# Patient Record
Sex: Male | Born: 1975 | Race: White | Hispanic: No | Marital: Married | Smoking: Current some day smoker
Health system: Southern US, Community
[De-identification: ages and names within clinical notes are randomized; demographics above are authoritative.]

## PROBLEM LIST (undated history)

## (undated) DIAGNOSIS — L089 Local infection of the skin and subcutaneous tissue, unspecified: Secondary | ICD-10-CM

## (undated) DIAGNOSIS — F1910 Other psychoactive substance abuse, uncomplicated: Secondary | ICD-10-CM

## (undated) DIAGNOSIS — B9562 Methicillin resistant Staphylococcus aureus infection as the cause of diseases classified elsewhere: Secondary | ICD-10-CM

## (undated) DIAGNOSIS — J342 Deviated nasal septum: Secondary | ICD-10-CM

## (undated) DIAGNOSIS — F17200 Nicotine dependence, unspecified, uncomplicated: Secondary | ICD-10-CM

## (undated) DIAGNOSIS — K219 Gastro-esophageal reflux disease without esophagitis: Secondary | ICD-10-CM

## (undated) HISTORY — PX: ABDOMINAL SURGERY: SHX537

## (undated) HISTORY — PX: HAND SURGERY: SHX662

---

## 2013-06-18 ENCOUNTER — Emergency Department (HOSPITAL_COMMUNITY): Payer: No Typology Code available for payment source

## 2013-06-18 ENCOUNTER — Encounter (HOSPITAL_COMMUNITY): Payer: Self-pay | Admitting: Emergency Medicine

## 2013-06-18 ENCOUNTER — Emergency Department (HOSPITAL_COMMUNITY)
Admission: EM | Admit: 2013-06-18 | Discharge: 2013-06-18 | Disposition: A | Discharge status: Home / Self Care | Payer: No Typology Code available for payment source | Attending: Emergency Medicine | Admitting: Emergency Medicine

## 2013-06-18 DIAGNOSIS — S20219A Contusion of unspecified front wall of thorax, initial encounter: Secondary | ICD-10-CM | POA: Insufficient documentation

## 2013-06-18 DIAGNOSIS — S20211A Contusion of right front wall of thorax, initial encounter: Secondary | ICD-10-CM

## 2013-06-18 DIAGNOSIS — Z880 Allergy status to penicillin: Secondary | ICD-10-CM | POA: Insufficient documentation

## 2013-06-18 DIAGNOSIS — F17200 Nicotine dependence, unspecified, uncomplicated: Secondary | ICD-10-CM | POA: Insufficient documentation

## 2013-06-18 DIAGNOSIS — Z79899 Other long term (current) drug therapy: Secondary | ICD-10-CM | POA: Insufficient documentation

## 2013-06-18 DIAGNOSIS — H00019 Hordeolum externum unspecified eye, unspecified eyelid: Secondary | ICD-10-CM | POA: Insufficient documentation

## 2013-06-18 DIAGNOSIS — Y92410 Unspecified street and highway as the place of occurrence of the external cause: Secondary | ICD-10-CM | POA: Insufficient documentation

## 2013-06-18 DIAGNOSIS — S46912A Strain of unspecified muscle, fascia and tendon at shoulder and upper arm level, left arm, initial encounter: Secondary | ICD-10-CM

## 2013-06-18 DIAGNOSIS — IMO0002 Reserved for concepts with insufficient information to code with codable children: Secondary | ICD-10-CM | POA: Insufficient documentation

## 2013-06-18 DIAGNOSIS — Y9389 Activity, other specified: Secondary | ICD-10-CM | POA: Insufficient documentation

## 2013-06-18 LAB — URINALYSIS, ROUTINE W REFLEX MICROSCOPIC
BILIRUBIN URINE: NEGATIVE
Glucose, UA: NEGATIVE mg/dL
HGB URINE DIPSTICK: NEGATIVE
Ketones, ur: NEGATIVE mg/dL
Leukocytes, UA: NEGATIVE
Nitrite: NEGATIVE
PH: 6 (ref 5.0–8.0)
Protein, ur: NEGATIVE mg/dL
Specific Gravity, Urine: 1.02 (ref 1.005–1.030)
Urobilinogen, UA: 0.2 mg/dL (ref 0.0–1.0)

## 2013-06-18 MED ORDER — HYDROCODONE-ACETAMINOPHEN 5-325 MG PO TABS: 1.0000 | ORAL_TABLET | ORAL | Status: DC | PRN

## 2013-06-18 MED ORDER — DICLOFENAC SODIUM 75 MG PO TBEC: 75.0000 mg | DELAYED_RELEASE_TABLET | Freq: Two times a day (BID) | ORAL | Status: DC
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
## 2013-06-19 NOTE — ED Provider Notes (Signed)
Medical screening examination/treatment/procedure(s) were performed by non-physician practitioner and as supervising physician I was immediately available for consultation/collaboration.  EKG Interpretation   None         Athena Baltz L Makana Rostad, MD 06/19/13 1002 

## 2013-10-02 ENCOUNTER — Emergency Department (HOSPITAL_COMMUNITY): Payer: No Typology Code available for payment source

## 2013-10-02 ENCOUNTER — Encounter (HOSPITAL_COMMUNITY): Payer: Self-pay | Admitting: Emergency Medicine

## 2013-10-02 ENCOUNTER — Emergency Department (HOSPITAL_COMMUNITY)
Admission: EM | Admit: 2013-10-02 | Discharge: 2013-10-02 | Disposition: A | Discharge status: Home / Self Care | Payer: No Typology Code available for payment source | Attending: Emergency Medicine | Admitting: Emergency Medicine

## 2013-10-02 DIAGNOSIS — R42 Dizziness and giddiness: Secondary | ICD-10-CM | POA: Insufficient documentation

## 2013-10-02 DIAGNOSIS — Z880 Allergy status to penicillin: Secondary | ICD-10-CM | POA: Insufficient documentation

## 2013-10-02 DIAGNOSIS — R079 Chest pain, unspecified: Secondary | ICD-10-CM

## 2013-10-02 DIAGNOSIS — F17200 Nicotine dependence, unspecified, uncomplicated: Secondary | ICD-10-CM | POA: Insufficient documentation

## 2013-10-02 DIAGNOSIS — R0789 Other chest pain: Secondary | ICD-10-CM | POA: Insufficient documentation

## 2013-10-02 LAB — BASIC METABOLIC PANEL
BUN: 9 mg/dL (ref 6–23)
CHLORIDE: 101 meq/L (ref 96–112)
CO2: 25 mEq/L (ref 19–32)
CREATININE: 0.84 mg/dL (ref 0.50–1.35)
Calcium: 8.7 mg/dL (ref 8.4–10.5)
GFR calc Af Amer: >90 mL/min (ref >90)
GFR calc non Af Amer: >90 mL/min (ref >90)
GLUCOSE: 114 mg/dL — AB (ref 70–99)
Potassium: 3.7 mEq/L (ref 3.7–5.3)
Sodium: 138 mEq/L (ref 137–147)

## 2013-10-02 LAB — CBC
HCT: 43.1 % (ref 39.0–52.0)
HEMOGLOBIN: 15.3 g/dL (ref 13.0–17.0)
MCH: 35.7 pg — AB (ref 26.0–34.0)
MCHC: 35.5 g/dL (ref 30.0–36.0)
MCV: 100.7 fL — AB (ref 78.0–100.0)
Platelets: 221 10*3/uL (ref 150–400)
RBC: 4.28 MIL/uL (ref 4.22–5.81)
RDW: 13.1 % (ref 11.5–15.5)
WBC: 8.1 10*3/uL (ref 4.0–10.5)

## 2013-10-02 LAB — TROPONIN I: Troponin I: <0.3 ng/mL (ref <0.30)

## 2013-10-02 NOTE — ED Notes (Signed)
Pt states he noticed pressure to entire upper chest with "tightness" to left chest. Pt states he noticed the pain at 0700. Pt also states he had chills and dizziness.

## 2013-10-02 NOTE — Discharge Instructions (Signed)
Chest Pain (Nonspecific) Chest pain has many causes. Your pain could be caused by something serious, such as a heart attack or a blood clot in the lungs. It could also be caused by something less serious, such as a chest bruise or a virus. Follow up with your doctor. More lab tests or other studies may be needed to find the cause of your pain. Most of the time, nonspecific chest pain will improve within 2 to 3 days of rest and mild pain medicine. HOME CARE  For chest bruises, you may put ice on the sore area for 15-20 minutes, 03-04 times a day. Do this only if it makes you feel better.  Put ice in a plastic bag.  Place a towel between the skin and the bag.  Rest for the next 2 to 3 days.  Go back to work if the pain improves.  See your doctor if the pain lasts longer than 1 to 2 weeks.  Only take medicine as told by your doctor.  Quit smoking if you smoke. GET HELP RIGHT AWAY IF:   There is more pain or pain that spreads to the arm, neck, jaw, back, or belly (abdomen).  You have shortness of breath.  You cough more than usual or cough up blood.  You have very bad back or belly pain, feel sick to your stomach (nauseous), or throw up (vomit).  You have very bad weakness.  You pass out (faint).  You have a fever. Any of these problems may be serious and may be an emergency. Do not wait to see if the problems will go away. Get medical help right away. Call your local emergency services 911 in [place] Do not drive yourself to the hospital. MAKE SURE YOU:   Understand these instructions.  Will watch this condition.  Will get help right away if you or your child is not doing well or gets worse. Document Released: 11/13/2007 Document Revised: 08/19/2011 Document Reviewed: 11/13/2007 [facility] Patient Information 2014 [place]ExitCare, [place].  Tests were normal. Tylenol or ibuprofen for pain. Try to get a primary care Dr.

## 2013-10-02 NOTE — ED Provider Notes (Signed)
CSN: [id number]     Arrival date & time 10/02/13  0902 History  This chart was scribed for Donnetta HutchingBrian Bhavin Monjaraz, MD by Quintella ReichertMatthew Underwood, ED scribe.  This patient was seen in room APA06/APA06 and the patient's care was started at 10:25 AM.   Chief Complaint  Patient presents with  . Chest Pain    The history is provided by the patient. No language interpreter was used.    HPI Comments: Eric Woods is a 38 y.o. male who presents to the Emergency Department complaining of upper chest "tightness" that began 3 1/2 hours ago.  Pt reports he was standing in the restroom when he suddenly developed pressure to his entire upper chest with "tightness" to his left chest.  He also reports associated lightheadedness, dizziness, and hot and cold flashes.  These symptoms have since resolved but his chest tightness has persisted.  Pt notes that he had a similar episode 1 week ago in which he was standing in the restroom after urinating and suddenly became lightheaded and dizzy and saw "lights."  He notes that he was alcohol-intoxicated at that time.  Pt denies h/o DM, HTN, or any other chronic medical conditions.  He takes Roxicodone 20mg  occasionally "just to feel relaxed" but denies any regular prescription medication usage.  He uses marijuana (4-6 cigars/day).  He also smokes cigarettes daily.  He states he drinks alcohol socially but is not a heavy drinker.  Pt recently got out of jail and is not employed currently.  Pt admits to family h/o heart disease in his paternal grandfather who died of a heart attack, but he is unsure how old he was at the time of death.   History reviewed. No pertinent past medical history.   Past Surgical History  Procedure Laterality Date  . Abdominal surgery      No family history on file.   History  Substance Use Topics  . Smoking status: Current Every Day Smoker    Types: Cigarettes  . Smokeless tobacco: Not on file  . Alcohol Use: Yes     Review of Systems A complete  10 system review of systems was obtained and all systems are negative except as noted in the HPI and PMH.     Allergies  Penicillins  Home Medications   Prior to Admission medications   Not on File   BP 108/64  Pulse 80  Temp(Src) 98 F (36.7 C) (Oral)  Resp 16  SpO2 95%  Physical Exam  Nursing note and vitals reviewed. Constitutional: He is oriented to person, place, and time. He appears well-developed and well-nourished.  HENT:  Head: Normocephalic and atraumatic.  Eyes: Conjunctivae and EOM are normal. Pupils are equal, round, and reactive to light.  Neck: Normal range of motion. Neck supple.  Cardiovascular: Normal rate, regular rhythm and normal heart sounds.   No murmur heard. Pulmonary/Chest: Effort normal and breath sounds normal. No respiratory distress. He has no wheezes. He has no rales.  Abdominal: Soft. Bowel sounds are normal.  Musculoskeletal: Normal range of motion.  Neurological: He is alert and oriented to person, place, and time.  Skin: Skin is warm and dry.  Psychiatric: He has a normal mood and affect. His behavior is normal.    ED Course  Procedures (including critical care time)  DIAGNOSTIC STUDIES: Oxygen Saturation is 95% which is normal by my interpretation.    COORDINATION OF CARE: 10:30 AM-Advised pt to manage his risk factors including his excessive smoking.  Discussed treatment plan  which includes EKG, CXR and labs with pt at bedside and pt agreed to plan.    Results for orders placed during the hospital encounter of 10/02/13  BASIC METABOLIC PANEL      Result Value Ref Range   Sodium 138  137 - 147 mEq/L   Potassium 3.7  3.7 - 5.3 mEq/L   Chloride 101  96 - 112 mEq/L   CO2 25  19 - 32 mEq/L   Glucose, Bld 114 (*) 70 - 99 mg/dL   BUN 9  6 - 23 mg/dL   Creatinine, Ser 9.600.84  0.50 - 1.35 mg/dL   Calcium 8.7  8.4 - 45.410.5 mg/dL   GFR calc non Af Amer >90  >90 mL/min   GFR calc Af Amer >90  >90 mL/min  CBC      Result Value Ref Range    WBC 8.1  4.0 - 10.5 K/uL   RBC 4.28  4.22 - 5.81 MIL/uL   Hemoglobin 15.3  13.0 - 17.0 g/dL   HCT 09.843.1  11.939.0 - 14.752.0 %   MCV 100.7 (*) 78.0 - 100.0 fL   MCH 35.7 (*) 26.0 - 34.0 pg   MCHC 35.5  30.0 - 36.0 g/dL   RDW 82.913.1  56.211.5 - 13.015.5 %   Platelets 221  150 - 400 K/uL  TROPONIN I      Result Value Ref Range   Troponin I <0.30  <0.30 ng/mL    Dg Chest 2 View  10/02/2013   CLINICAL DATA:  Chest tightness  EXAM: CHEST  2 VIEW  COMPARISON:  06/18/2013  FINDINGS: Lungs are clear.  No pleural effusion or pneumothorax.  The heart is normal in size.  Mild degenerative changes of the visualized thoracolumbar spine.  IMPRESSION: No evidence of acute cardiopulmonary disease.   Electronically Signed   By: Charline BillsSriyesh  Krishnan M.D.   On: 10/02/2013 10:21     EKG Interpretation   Date/Time:  Saturday October 02 2013 09:13:36 EDT Ventricular Rate:  53 PR Interval:  158 QRS Duration: 108 QT Interval:  426 QTC Calculation: 399 R Axis:   43 Text Interpretation:  Sinus bradycardia Otherwise normal ECG No previous  ECGs available Confirmed by Donnalynn Wheeless  MD, Meeya Goldin ([id number]) on 10/02/2013 1:14:57  PM      MDM   Final diagnoses:  Chest pain    Patient is low risk for ACS or PE. Screening test negative. He is in no acute distress    I personally performed the services described in this documentation, which was scribed in my presence. The recorded information has been reviewed and is accurate.    Donnetta HutchingBrian Lacresia Darwish, MD 10/02/13 1316

## 2013-12-31 ENCOUNTER — Encounter (HOSPITAL_COMMUNITY): Payer: Self-pay | Admitting: Emergency Medicine

## 2013-12-31 ENCOUNTER — Emergency Department (HOSPITAL_COMMUNITY)
Admission: EM | Admit: 2013-12-31 | Discharge: 2013-12-31 | Disposition: A | Discharge status: Home / Self Care | Payer: No Typology Code available for payment source | Attending: Emergency Medicine | Admitting: Emergency Medicine

## 2013-12-31 DIAGNOSIS — R21 Rash and other nonspecific skin eruption: Secondary | ICD-10-CM | POA: Insufficient documentation

## 2013-12-31 DIAGNOSIS — Z880 Allergy status to penicillin: Secondary | ICD-10-CM | POA: Insufficient documentation

## 2013-12-31 DIAGNOSIS — L738 Other specified follicular disorders: Secondary | ICD-10-CM | POA: Insufficient documentation

## 2013-12-31 DIAGNOSIS — L739 Follicular disorder, unspecified: Secondary | ICD-10-CM

## 2013-12-31 DIAGNOSIS — R110 Nausea: Secondary | ICD-10-CM | POA: Insufficient documentation

## 2013-12-31 DIAGNOSIS — F17200 Nicotine dependence, unspecified, uncomplicated: Secondary | ICD-10-CM | POA: Insufficient documentation

## 2013-12-31 MED ORDER — SULFAMETHOXAZOLE-TRIMETHOPRIM 800-160 MG PO TABS: 1.0000 | ORAL_TABLET | Freq: Two times a day (BID) | ORAL | Status: DC

## 2013-12-31 NOTE — Discharge Instructions (Signed)

## 2013-12-31 NOTE — ED Notes (Signed)
Pt alert & oriented x4, stable gait. Patient given discharge instructions, paperwork & prescription(s). Patient  instructed to stop at the registration desk to finish any additional paperwork. Patient verbalized understanding. Pt left department w/ no further questions. 

## 2013-12-31 NOTE — ED Notes (Signed)
Pt has a rash on the inside of his legs. Pt states the rash hurts and makes him nauseated.

## 2014-01-02 NOTE — ED Provider Notes (Signed)
CSN: [id number]     Arrival date & time 12/31/13  1849 History   First MD Initiated Contact with Patient 12/31/13 1905     No chief complaint on file.    (Consider location/radiation/quality/duration/timing/severity/associated sxs/prior Treatment)  Eric Woods is a 38 y.o. male who presents to the Emergency Department complaining of painful rash to both legs.  He states it begins as "pimples" and he squeezes them, but its not improving.   Patient is a 38 y.o. male presenting with rash. The history is provided by the patient.  Rash Location:  Leg Leg rash location:  R upper leg, L upper leg and R lower leg Quality: painful and redness   Quality: not blistering, not itchy, not swelling and not weeping   Pain details:    Quality:  Aching   Severity:  Mild   Onset quality:  Gradual   Duration:  3 days   Timing:  Constant   Progression:  Unchanged Severity:  Moderate Onset quality:  Gradual Timing:  Constant Progression:  Unchanged Chronicity:  New Context: not hot tub use, not medications and not plant contact   Context comment:  Patient was recently incarcerated Relieved by:  Nothing Worsened by:  Nothing tried Ineffective treatments:  None tried Associated symptoms: nausea   Associated symptoms: no abdominal pain, no diarrhea, no fever, no headaches, no induration, no joint pain, no myalgias, no periorbital edema, no shortness of breath, no sore throat, no throat swelling, no tongue swelling, no URI, not vomiting and not wheezing     History reviewed. No pertinent past medical history. Past Surgical History  Procedure Laterality Date  . Abdominal surgery     History reviewed. No pertinent family history. History  Substance Use Topics  . Smoking status: Current Every Day Smoker    Types: Cigarettes  . Smokeless tobacco: Not on file  . Alcohol Use: Yes    Review of Systems  Constitutional: Negative for fever, chills, activity change and appetite change.  HENT:  Negative for facial swelling, sore throat and trouble swallowing.   Respiratory: Negative for chest tightness, shortness of breath and wheezing.   Gastrointestinal: Positive for nausea. Negative for vomiting, abdominal pain and diarrhea.  Musculoskeletal: Negative for arthralgias, myalgias, neck pain and neck stiffness.  Skin: Positive for color change and rash. Negative for wound.  Neurological: Negative for dizziness, weakness, numbness and headaches.  Hematological: Negative for adenopathy.  All other systems reviewed and are negative.     Allergies  Penicillins  Home Medications   Prior to Admission medications   Medication Sig Start Date End Date Taking? Authorizing Provider  sulfamethoxazole-trimethoprim (SEPTRA DS) 800-160 MG per tablet Take 1 tablet by mouth 2 (two) times daily. For 10 days 12/31/13   Akiel Fennell L. Rafay Dahan, PA-C   BP 124/85  Pulse 73  Temp(Src) 98.4 F (36.9 C) (Oral)  Resp 18  Ht 6' (1.829 m)  Wt 183 lb 4.8 oz (83.144 kg)  BMI 24.85 kg/m2  SpO2 98% Physical Exam  Nursing note and vitals reviewed. Constitutional: He is oriented to person, place, and time. He appears well-developed and well-nourished. No distress.  HENT:  Head: Normocephalic and atraumatic.  Mouth/Throat: Oropharynx is clear and moist.  Neck: Normal range of motion. Neck supple.  Cardiovascular: Normal rate, regular rhythm, normal heart sounds and intact distal pulses.   No murmur heard. Pulmonary/Chest: Effort normal and breath sounds normal. No respiratory distress.  Musculoskeletal: He exhibits no edema and no tenderness.  Lymphadenopathy:  He has no cervical adenopathy.  Neurological: He is alert and oriented to person, place, and time. He exhibits normal muscle tone. Coordination normal.  Skin: Skin is warm. Rash noted. There is erythema.  Multiple, < 1 cm erythematous pustules to the bilateral LE's.  Some of the lesions are scabbed over.  No induration or surrounding erythema.     ED Course  Procedures (including critical care time) Labs Review Labs Reviewed - No data to display  Imaging Review No results found.   EKG Interpretation None      MDM   Final diagnoses:  Folliculitis   Pt is well appearing, vitals stable.  Rash likely related to folliculitis.  Pt was recently incarcerated, so MRSA also possible.  Will treat with bactrim and he agrees to return here in 2-3 days if not improving.       Anushree Dorsi L. Zacariah Belue, PA-C 01/02/14 1310

## 2014-01-03 NOTE — ED Provider Notes (Signed)
Medical screening examination/treatment/procedure(s) were performed by non-physician practitioner and as supervising physician I was immediately available for consultation/collaboration.   EKG Interpretation None       Donnetta HutchingBrian Redmond Whittley, MD 01/03/14 [phone]

## 2014-05-21 ENCOUNTER — Emergency Department (HOSPITAL_COMMUNITY): Payer: Self-pay

## 2014-05-21 ENCOUNTER — Emergency Department (HOSPITAL_COMMUNITY)
Admission: EM | Admit: 2014-05-21 | Discharge: 2014-05-21 | Disposition: A | Discharge status: Home / Self Care | Payer: Self-pay | Attending: Emergency Medicine | Admitting: Emergency Medicine

## 2014-05-21 ENCOUNTER — Encounter (HOSPITAL_COMMUNITY): Payer: Self-pay | Admitting: Emergency Medicine

## 2014-05-21 DIAGNOSIS — B070 Plantar wart: Secondary | ICD-10-CM | POA: Insufficient documentation

## 2014-05-21 DIAGNOSIS — Z792 Long term (current) use of antibiotics: Secondary | ICD-10-CM | POA: Insufficient documentation

## 2014-05-21 DIAGNOSIS — M79672 Pain in left foot: Secondary | ICD-10-CM

## 2014-05-21 DIAGNOSIS — Z880 Allergy status to penicillin: Secondary | ICD-10-CM | POA: Insufficient documentation

## 2014-05-21 DIAGNOSIS — Z720 Tobacco use: Secondary | ICD-10-CM | POA: Insufficient documentation

## 2014-05-21 MED ORDER — HYDROCODONE-ACETAMINOPHEN 5-325 MG PO TABS: ORAL_TABLET | ORAL | Status: DC

## 2014-05-21 MED ORDER — NAPROXEN 500 MG PO TABS: 500.0000 mg | ORAL_TABLET | Freq: Two times a day (BID) | ORAL | Status: DC

## 2014-05-21 NOTE — Discharge Instructions (Signed)
Plantar Warts  Plantar warts are growths on the bottom of your foot. Warts are caused by a germ.   HOME CARE  · Soak your foot in warm water. Dry your foot when you are done. Remove the top layer of softened skin, then apply any medicine as told by your doctor.  · Remove any bandages daily. File off extra wart tissue. Repeat this as told by your doctor until the wart goes away.  · Only use medicine as told by your doctor.  · Use a bandage with a hole in it (doughnut bandage) to relieve pain. Put the hole over the wart.  · Wear shoes and socks and change them daily.  · Keep your foot clean and dry.  · Check your feet regularly.  · Avoid contact with warts on other people.  · Have your warts checked by your doctor.  GET HELP RIGHT AWAY IF:  The treated skin becomes red, puffy (swollen), or painful.  MAKE SURE YOU:  · Understand these instructions.  · Will watch your condition.  · Will get help right away if you are not doing well or get worse.  Document Released: 06/29/2010 Document Revised: 10/11/2013 Document Reviewed: 06/29/2010  ExitCare® Patient Information ©2015 [employer]. This information is not intended to replace advice given to you by your health care provider. Make sure you discuss any questions you have with your health care provider.

## 2014-05-21 NOTE — ED Notes (Signed)
Pt states he has pain to bottom of L. Foot. Denies any injury. States he has picked at the spot on the ball of his foot.

## 2014-05-23 NOTE — ED Provider Notes (Signed)
CSN: [id number]     Arrival date & time 05/21/14  1532 History   First MD Initiated Contact with Patient 05/21/14 1625     Chief Complaint  Patient presents with  . Foot Pain     (Consider location/radiation/quality/duration/timing/severity/associated sxs/prior Treatment) HPI  Eric Woods is a 38 y.o. male who presents to the Emergency Department complaining of left foot pain for several days to weeks.  He reports incresing pain with weight bearing and improvement when at rest.  He describes a sharp, sticking sensation ot his foot with pressure.  He denies known injury, drainage, swelling or redness.  He has tried to "pick" the area , but states it made the pain worse.    History reviewed. No pertinent past medical history. Past Surgical History  Procedure Laterality Date  . Abdominal surgery     History reviewed. No pertinent family history. History  Substance Use Topics  . Smoking status: Current Every Day Smoker    Types: Cigarettes  . Smokeless tobacco: Not on file  . Alcohol Use: Yes    Review of Systems  Constitutional: Negative for fever and chills.  Genitourinary: Negative for dysuria and difficulty urinating.  Musculoskeletal: Positive for arthralgias. Negative for joint swelling.  Skin: Negative for color change and wound.  All other systems reviewed and are negative.     Allergies  Penicillins  Home Medications   Prior to Admission medications   Medication Sig Start Date End Date Taking? Authorizing Provider  HYDROcodone-acetaminophen (NORCO/VICODIN) 5-325 MG per tablet Take one-two tabs po q 4-6 hrs prn pain 05/21/14   Earley Grobe L. Shaneisha Burkel, PA-C  naproxen (NAPROSYN) 500 MG tablet Take 1 tablet (500 mg total) by mouth 2 (two) times daily with a meal. 05/21/14   Saleem Coccia L. Brucha Ahlquist, PA-C  sulfamethoxazole-trimethoprim (SEPTRA DS) 800-160 MG per tablet Take 1 tablet by mouth 2 (two) times daily. For 10 days 12/31/13   Drusilla Wampole L. Calyse Murcia, PA-C   BP 111/71 mmHg   Pulse 90  Temp(Src) 98.3 F (36.8 C) (Oral)  Resp 18  Ht 6' (1.829 m)  Wt 190 lb (86.183 kg)  BMI 25.76 kg/m2  SpO2 97% Physical Exam  Constitutional: He is oriented to person, place, and time. He appears well-developed and well-nourished. No distress.  HENT:  Head: Normocephalic and atraumatic.  Cardiovascular: Normal rate, regular rhythm, normal heart sounds and intact distal pulses.   Pulmonary/Chest: Effort normal and breath sounds normal.  Musculoskeletal: He exhibits tenderness.  Localized plantar wart to the ball of the left foot.  ROM is preserved.  DP pulse is brisk,distal sensation intact.  No erythema, abrasion, bruising or bony deformity.  No proximal tenderness. No FB  Neurological: He is alert and oriented to person, place, and time. He exhibits normal muscle tone. Coordination normal.  Skin: Skin is warm and dry.  Nursing note and vitals reviewed.   ED Course  Procedures (including critical care time) Labs Review Labs Reviewed - No data to display  Imaging Review Dg Foot Complete Left  05/21/2014   CLINICAL DATA:  38 year old male with progressive left foot pain  EXAM: LEFT FOOT - COMPLETE 3+ VIEW  COMPARISON:  None  FINDINGS: No evidence for acute fracture or malalignment. No arthropathy. Normal bony mineralization. No lytic or blastic osseous lesion.  IMPRESSION: Negative.   Electronically Signed   By: Malachy MoanHeath  McCullough M.D.   On: 05/21/2014 16:24     EKG Interpretation None      MDM   Final diagnoses:  Plantar wart of left foot    Pt with plantar wart to the left foot.  No concerning signs of infection or FB.  Given referral for podiatry.  Pt agrees to arrange f/u    Tata Timmins L. Keimya Briddell, PA-C 05/23/14 2258  Kasin Tonkinson L. Trisha Mangleriplett, PA-C 05/23/14 2259  Glynn OctaveStephen Rancour, MD 05/24/14 [phone]

## 2014-06-11 DIAGNOSIS — W1839XA Other fall on same level, initial encounter: Secondary | ICD-10-CM | POA: Insufficient documentation

## 2014-06-11 DIAGNOSIS — Z23 Encounter for immunization: Secondary | ICD-10-CM | POA: Insufficient documentation

## 2014-06-11 DIAGNOSIS — R079 Chest pain, unspecified: Secondary | ICD-10-CM | POA: Insufficient documentation

## 2014-06-11 DIAGNOSIS — Z720 Tobacco use: Secondary | ICD-10-CM | POA: Insufficient documentation

## 2014-06-11 DIAGNOSIS — Y9289 Other specified places as the place of occurrence of the external cause: Secondary | ICD-10-CM | POA: Insufficient documentation

## 2014-06-11 DIAGNOSIS — Y998 Other external cause status: Secondary | ICD-10-CM | POA: Insufficient documentation

## 2014-06-11 DIAGNOSIS — Z791 Long term (current) use of non-steroidal anti-inflammatories (NSAID): Secondary | ICD-10-CM | POA: Insufficient documentation

## 2014-06-11 DIAGNOSIS — F10120 Alcohol abuse with intoxication, uncomplicated: Secondary | ICD-10-CM | POA: Insufficient documentation

## 2014-06-11 DIAGNOSIS — S0181XA Laceration without foreign body of other part of head, initial encounter: Secondary | ICD-10-CM | POA: Insufficient documentation

## 2014-06-11 DIAGNOSIS — Y9389 Activity, other specified: Secondary | ICD-10-CM | POA: Insufficient documentation

## 2014-06-11 DIAGNOSIS — Z792 Long term (current) use of antibiotics: Secondary | ICD-10-CM | POA: Insufficient documentation

## 2014-06-11 DIAGNOSIS — Z880 Allergy status to penicillin: Secondary | ICD-10-CM | POA: Insufficient documentation

## 2014-06-12 ENCOUNTER — Emergency Department (HOSPITAL_COMMUNITY)
Admission: EM | Admit: 2014-06-12 | Discharge: 2014-06-12 | Disposition: A | Discharge status: Home / Self Care | Payer: Self-pay | Attending: Emergency Medicine | Admitting: Emergency Medicine

## 2014-06-12 ENCOUNTER — Encounter (HOSPITAL_COMMUNITY): Payer: Self-pay | Admitting: Emergency Medicine

## 2014-06-12 ENCOUNTER — Emergency Department (HOSPITAL_COMMUNITY): Payer: Self-pay

## 2014-06-12 DIAGNOSIS — R079 Chest pain, unspecified: Secondary | ICD-10-CM

## 2014-06-12 DIAGNOSIS — S0181XA Laceration without foreign body of other part of head, initial encounter: Secondary | ICD-10-CM

## 2014-06-12 DIAGNOSIS — F10920 Alcohol use, unspecified with intoxication, uncomplicated: Secondary | ICD-10-CM

## 2014-06-12 LAB — COMPREHENSIVE METABOLIC PANEL
ALBUMIN: 4.6 g/dL (ref 3.5–5.2)
ALT: 19 U/L (ref 0–53)
AST: 24 U/L (ref 0–37)
Alkaline Phosphatase: 54 U/L (ref 39–117)
BILIRUBIN TOTAL: 0.4 mg/dL (ref 0.3–1.2)
BUN: 11 mg/dL (ref 6–23)
CALCIUM: 8.5 mg/dL (ref 8.4–10.5)
CO2: 23 mmol/L (ref 19–32)
Chloride: 108 mEq/L (ref 96–112)
Creatinine, Ser: 0.92 mg/dL (ref 0.50–1.35)
Glucose, Bld: 98 mg/dL (ref 70–99)
Potassium: 3.5 mmol/L (ref 3.5–5.1)
Sodium: 142 mmol/L (ref 135–145)
Total Protein: 7.8 g/dL (ref 6.0–8.3)

## 2014-06-12 LAB — CBC
HEMATOCRIT: 46.7 % (ref 39.0–52.0)
Hemoglobin: 16.1 g/dL (ref 13.0–17.0)
MCH: 34.5 pg — AB (ref 26.0–34.0)
MCHC: 34.5 g/dL (ref 30.0–36.0)
MCV: 100.2 fL — AB (ref 78.0–100.0)
PLATELETS: 346 10*3/uL (ref 150–400)
RBC: 4.66 MIL/uL (ref 4.22–5.81)
RDW: 12.5 % (ref 11.5–15.5)
WBC: 9.6 10*3/uL (ref 4.0–10.5)

## 2014-06-12 LAB — TROPONIN I

## 2014-06-12 MED ORDER — TETANUS-DIPHTH-ACELL PERTUSSIS 5-2.5-18.5 LF-MCG/0.5 IM SUSP
0.5000 mL | Freq: Once | INTRAMUSCULAR | Status: AC
Start: 2014-06-12 — End: 2014-06-12
  Administered 2014-06-12: 0.5 mL via INTRAMUSCULAR

## 2014-06-12 MED ORDER — LIDOCAINE-EPINEPHRINE (PF) 1 %-1:200000 IJ SOLN
INTRAMUSCULAR | Status: AC
  Filled 2014-06-12: qty 10

## 2014-06-12 MED ORDER — TETANUS-DIPHTH-ACELL PERTUSSIS 5-2.5-18.5 LF-MCG/0.5 IM SUSP
INTRAMUSCULAR | Status: AC
  Filled 2014-06-12: qty 0.5
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
## 2014-06-12 NOTE — ED Notes (Signed)
Dr. Preston Fleeting in to suture chin.
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
## 2014-06-12 NOTE — ED Notes (Signed)
Patient denies chest pain when asked about it.  Patient states he feels like he's having an anxiety attack.  LT Elson Areas in room and comes out stating patient is having chest pain.
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
## 2014-06-12 NOTE — Discharge Instructions (Signed)
Stitches need to be removed in 7-10 days. That can be done here, at a doctor's office, or at an urgent care center.  Laceration Care, Adult A laceration is a cut or lesion that goes through all layers of the skin and into the tissue just beneath the skin. TREATMENT  Some lacerations may not require closure. Some lacerations may not be able to be closed due to an increased risk of infection. It is important to see your caregiver as soon as possible after an injury to minimize the risk of infection and maximize the opportunity for successful closure. If closure is appropriate, pain medicines may be given, if needed. The wound will be cleaned to help prevent infection. Your caregiver will use stitches (sutures), staples, wound glue (adhesive), or skin adhesive strips to repair the laceration. These tools bring the skin edges together to allow for faster healing and a better cosmetic outcome. However, all wounds will heal with a scar. Once the wound has healed, scarring can be minimized by covering the wound with sunscreen during the day for 1 full year. HOME CARE INSTRUCTIONS  For sutures or staples:  Keep the wound clean and dry.  If you were given a bandage (dressing), you should change it at least once a day. Also, change the dressing if it becomes wet or dirty, or as directed by your caregiver.  Wash the wound with soap and water 2 times a day. Rinse the wound off with water to remove all soap. Pat the wound dry with a clean towel.  After cleaning, apply a thin layer of the antibiotic ointment as recommended by your caregiver. This will help prevent infection and keep the dressing from sticking.  You may shower as usual after the first 24 hours. Do not soak the wound in water until the sutures are removed.  Only take over-the-counter or prescription medicines for pain, discomfort, or fever as directed by your caregiver.  Get your sutures or staples removed as directed by your caregiver. For  skin adhesive strips:  Keep the wound clean and dry.  Do not get the skin adhesive strips wet. You may bathe carefully, using caution to keep the wound dry.  If the wound gets wet, pat it dry with a clean towel.  Skin adhesive strips will fall off on their own. You may trim the strips as the wound heals. Do not remove skin adhesive strips that are still stuck to the wound. They will fall off in time. For wound adhesive:  You may briefly wet your wound in the shower or bath. Do not soak or scrub the wound. Do not swim. Avoid periods of heavy perspiration until the skin adhesive has fallen off on its own. After showering or bathing, gently pat the wound dry with a clean towel.  Do not apply liquid medicine, cream medicine, or ointment medicine to your wound while the skin adhesive is in place. This may loosen the film before your wound is healed.  If a dressing is placed over the wound, be careful not to apply tape directly over the skin adhesive. This may cause the adhesive to be pulled off before the wound is healed.  Avoid prolonged exposure to sunlight or tanning lamps while the skin adhesive is in place. Exposure to ultraviolet light in the first year will darken the scar.  The skin adhesive will usually remain in place for 5 to 10 days, then naturally fall off the skin. Do not pick at the adhesive film. You  may need a tetanus shot if:  You cannot remember when you had your last tetanus shot.  You have never had a tetanus shot. If you get a tetanus shot, your arm may swell, get red, and feel warm to the touch. This is common and not a problem. If you need a tetanus shot and you choose not to have one, there is a rare chance of getting tetanus. Sickness from tetanus can be serious. SEEK MEDICAL CARE IF:   You have redness, swelling, or increasing pain in the wound.  You see a red line that goes away from the wound.  You have yellowish-white fluid (pus) coming from the wound.  You  have a fever.  You notice a bad smell coming from the wound or dressing.  Your wound breaks open before or after sutures have been removed.  You notice something coming out of the wound such as wood or glass.  Your wound is on your hand or foot and you cannot move a finger or toe. SEEK IMMEDIATE MEDICAL CARE IF:   Your pain is not controlled with prescribed medicine.  You have severe swelling around the wound causing pain and numbness or a change in color in your arm, hand, leg, or foot.  Your wound splits open and starts bleeding.  You have worsening numbness, weakness, or loss of function of any joint around or beyond the wound.  You develop painful lumps near the wound or on the skin anywhere on your body. MAKE SURE YOU:   Understand these instructions.  Will watch your condition.  Will get help right away if you are not doing well or get worse. Document Released: 05/27/2005 Document Revised: 08/19/2011 Document Reviewed: 11/20/2010 [facility] Patient Information 2015 [place], [place]. This information is not intended to replace advice given to you by your health care provider. Make sure you discuss any questions you have with your health care provider.

## 2014-06-12 NOTE — ED Notes (Signed)
Dr. Glick in to suture chin. 
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
## 2014-09-22 ENCOUNTER — Emergency Department (HOSPITAL_COMMUNITY)
Admission: EM | Admit: 2014-09-22 | Discharge: 2014-09-23 | Disposition: A | Discharge status: Home / Self Care | Payer: Self-pay | Attending: Emergency Medicine | Admitting: Emergency Medicine

## 2014-09-22 ENCOUNTER — Emergency Department (HOSPITAL_COMMUNITY): Payer: Self-pay

## 2014-09-22 DIAGNOSIS — Y92410 Unspecified street and highway as the place of occurrence of the external cause: Secondary | ICD-10-CM | POA: Insufficient documentation

## 2014-09-22 DIAGNOSIS — S3991XA Unspecified injury of abdomen, initial encounter: Secondary | ICD-10-CM | POA: Insufficient documentation

## 2014-09-22 DIAGNOSIS — Y998 Other external cause status: Secondary | ICD-10-CM | POA: Insufficient documentation

## 2014-09-22 DIAGNOSIS — T148XXA Other injury of unspecified body region, initial encounter: Secondary | ICD-10-CM

## 2014-09-22 DIAGNOSIS — S299XXA Unspecified injury of thorax, initial encounter: Secondary | ICD-10-CM | POA: Insufficient documentation

## 2014-09-22 DIAGNOSIS — S0001XA Abrasion of scalp, initial encounter: Secondary | ICD-10-CM | POA: Insufficient documentation

## 2014-09-22 DIAGNOSIS — Y9389 Activity, other specified: Secondary | ICD-10-CM | POA: Insufficient documentation

## 2014-09-22 DIAGNOSIS — T148 Other injury of unspecified body region: Secondary | ICD-10-CM | POA: Insufficient documentation

## 2014-09-22 DIAGNOSIS — S199XXA Unspecified injury of neck, initial encounter: Secondary | ICD-10-CM | POA: Insufficient documentation

## 2014-09-22 MED ORDER — SODIUM CHLORIDE 0.9 % IV BOLUS (SEPSIS)
1000.0000 mL | Freq: Once | INTRAVENOUS | Status: AC
  Administered 2014-09-23: 1000 mL via INTRAVENOUS

## 2014-09-22 MED ORDER — LORAZEPAM 2 MG/ML IJ SOLN
1.0000 mg | Freq: Once | INTRAMUSCULAR | Status: AC
  Administered 2014-09-23: 1 mg via INTRAVENOUS
  Filled 2014-09-22: qty 1

## 2014-09-22 NOTE — ED Notes (Signed)
Pt refuses to give any information or answer questions. Pt states he needs to "take a piss". Urinal offered several times & pt refuses.
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
## 2014-09-22 NOTE — ED Notes (Signed)
Pt refused urinal again

## 2014-09-23 LAB — RAPID URINE DRUG SCREEN, HOSP PERFORMED
Amphetamines: NOT DETECTED
BARBITURATES: NOT DETECTED
Benzodiazepines: POSITIVE — AB
COCAINE: POSITIVE — AB
Opiates: NOT DETECTED
TETRAHYDROCANNABINOL: POSITIVE — AB

## 2014-09-23 LAB — CBC WITH DIFFERENTIAL/PLATELET
BASOS ABS: 0 10*3/uL (ref 0.0–0.1)
BASOS PCT: 0 % (ref 0–1)
Eosinophils Absolute: 0.3 10*3/uL (ref 0.0–0.7)
Eosinophils Relative: 3 % (ref 0–5)
HEMATOCRIT: 44.7 % (ref 39.0–52.0)
HEMOGLOBIN: 15.5 g/dL (ref 13.0–17.0)
Lymphocytes Relative: 34 % (ref 12–46)
Lymphs Abs: 3.2 10*3/uL (ref 0.7–4.0)
MCH: 35.9 pg — AB (ref 26.0–34.0)
MCHC: 34.7 g/dL (ref 30.0–36.0)
MCV: 103.5 fL — AB (ref 78.0–100.0)
MONO ABS: 0.5 10*3/uL (ref 0.1–1.0)
Monocytes Relative: 5 % (ref 3–12)
Neutro Abs: 5.3 10*3/uL (ref 1.7–7.7)
Neutrophils Relative %: 58 % (ref 43–77)
PLATELETS: 279 10*3/uL (ref 150–400)
RBC: 4.32 MIL/uL (ref 4.22–5.81)
RDW: 12.9 % (ref 11.5–15.5)
WBC: 9.2 10*3/uL (ref 4.0–10.5)

## 2014-09-23 LAB — URINALYSIS, ROUTINE W REFLEX MICROSCOPIC
BILIRUBIN URINE: NEGATIVE
Glucose, UA: NEGATIVE mg/dL
Ketones, ur: NEGATIVE mg/dL
Leukocytes, UA: NEGATIVE
NITRITE: NEGATIVE
PH: 5 (ref 5.0–8.0)
SPECIFIC GRAVITY, URINE: 1.025 (ref 1.005–1.030)
UROBILINOGEN UA: 0.2 mg/dL (ref 0.0–1.0)

## 2014-09-23 LAB — COMPREHENSIVE METABOLIC PANEL
ALK PHOS: 52 U/L (ref 39–117)
ALT: 33 U/L (ref 0–53)
AST: 39 U/L — ABNORMAL HIGH (ref 0–37)
Albumin: 4.4 g/dL (ref 3.5–5.2)
Anion gap: 9 (ref 5–15)
BUN: 11 mg/dL (ref 6–23)
CO2: 25 mmol/L (ref 19–32)
Calcium: 8.4 mg/dL (ref 8.4–10.5)
Chloride: 106 mmol/L (ref 96–112)
Creatinine, Ser: 0.99 mg/dL (ref 0.50–1.35)
GLUCOSE: 84 mg/dL (ref 70–99)
POTASSIUM: 4.3 mmol/L (ref 3.5–5.1)
Sodium: 140 mmol/L (ref 135–145)
TOTAL PROTEIN: 7.4 g/dL (ref 6.0–8.3)
Total Bilirubin: 0.5 mg/dL (ref 0.3–1.2)

## 2014-09-23 LAB — URINE MICROSCOPIC-ADD ON

## 2014-09-23 LAB — ETHANOL: ALCOHOL ETHYL (B): 224 mg/dL — AB (ref 0–9)

## 2014-09-23 MED ORDER — IOHEXOL 300 MG/ML  SOLN
100.0000 mL | Freq: Once | INTRAMUSCULAR | Status: AC | PRN
  Administered 2014-09-23: 100 mL via INTRAVENOUS

## 2014-09-23 MED ORDER — IBUPROFEN 600 MG PO TABS: 600.0000 mg | ORAL_TABLET | Freq: Four times a day (QID) | ORAL | Status: DC | PRN

## 2014-09-23 MED ORDER — IBUPROFEN 800 MG PO TABS
800.0000 mg | ORAL_TABLET | Freq: Once | ORAL | Status: AC
  Administered 2014-09-23: 800 mg via ORAL
  Filled 2014-09-23: qty 1

## 2014-09-23 NOTE — ED Notes (Signed)
Pt removed c collar before test resulted.
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
## 2014-09-23 NOTE — ED Notes (Signed)
Pt ambulated to restroom w/ [employer] officer, and returned to room w/ no complications.
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
## 2021-12-18 ENCOUNTER — Inpatient Hospital Stay (HOSPITAL_BASED_OUTPATIENT_CLINIC_OR_DEPARTMENT_OTHER)
Admission: EM | Admit: 2021-12-18 | Discharge: 2021-12-27 | DRG: 513 | Disposition: A | Discharge status: Home / Self Care | Payer: Self-pay | Attending: Internal Medicine | Admitting: Internal Medicine

## 2021-12-18 ENCOUNTER — Encounter (HOSPITAL_BASED_OUTPATIENT_CLINIC_OR_DEPARTMENT_OTHER): Payer: Self-pay | Admitting: Emergency Medicine

## 2021-12-18 ENCOUNTER — Other Ambulatory Visit: Payer: Self-pay

## 2021-12-18 DIAGNOSIS — R7401 Elevation of levels of liver transaminase levels: Secondary | ICD-10-CM | POA: Diagnosis present

## 2021-12-18 DIAGNOSIS — F1510 Other stimulant abuse, uncomplicated: Secondary | ICD-10-CM

## 2021-12-18 DIAGNOSIS — Z880 Allergy status to penicillin: Secondary | ICD-10-CM

## 2021-12-18 DIAGNOSIS — L02818 Cutaneous abscess of other sites: Secondary | ICD-10-CM | POA: Diagnosis present

## 2021-12-18 DIAGNOSIS — F1910 Other psychoactive substance abuse, uncomplicated: Secondary | ICD-10-CM | POA: Diagnosis present

## 2021-12-18 DIAGNOSIS — R21 Rash and other nonspecific skin eruption: Secondary | ICD-10-CM | POA: Diagnosis present

## 2021-12-18 DIAGNOSIS — L089 Local infection of the skin and subcutaneous tissue, unspecified: Secondary | ICD-10-CM | POA: Diagnosis present

## 2021-12-18 DIAGNOSIS — M659 Synovitis and tenosynovitis, unspecified: Principal | ICD-10-CM | POA: Diagnosis present

## 2021-12-18 DIAGNOSIS — F17210 Nicotine dependence, cigarettes, uncomplicated: Secondary | ICD-10-CM | POA: Diagnosis present

## 2021-12-18 NOTE — ED Triage Notes (Signed)
  Patient BIB EMS with sores on hands and ear that are bothering him.  Patient admits to meth use and states he keeps scratching and getting more sores on his body.  Patient states sore on R middle finger is hurting the most.  Patient actively picking at his sores in triage and asked the stop.  Pain 10/10, stabbing pain.

## 2021-12-19 ENCOUNTER — Observation Stay (HOSPITAL_COMMUNITY): Payer: Self-pay | Admitting: Certified Registered Nurse Anesthetist

## 2021-12-19 ENCOUNTER — Encounter (HOSPITAL_COMMUNITY): Payer: Self-pay | Admitting: Internal Medicine

## 2021-12-19 ENCOUNTER — Encounter (HOSPITAL_COMMUNITY)
Admission: EM | Disposition: A | Discharge status: Home / Self Care | Payer: Self-pay | Source: Home / Self Care | Attending: Internal Medicine

## 2021-12-19 ENCOUNTER — Emergency Department (HOSPITAL_BASED_OUTPATIENT_CLINIC_OR_DEPARTMENT_OTHER): Payer: Self-pay

## 2021-12-19 ENCOUNTER — Observation Stay (HOSPITAL_BASED_OUTPATIENT_CLINIC_OR_DEPARTMENT_OTHER): Payer: Self-pay | Admitting: Certified Registered Nurse Anesthetist

## 2021-12-19 ENCOUNTER — Other Ambulatory Visit: Payer: Self-pay

## 2021-12-19 DIAGNOSIS — I10 Essential (primary) hypertension: Secondary | ICD-10-CM

## 2021-12-19 DIAGNOSIS — M659 Synovitis and tenosynovitis, unspecified: Secondary | ICD-10-CM

## 2021-12-19 DIAGNOSIS — L089 Local infection of the skin and subcutaneous tissue, unspecified: Secondary | ICD-10-CM

## 2021-12-19 DIAGNOSIS — F17210 Nicotine dependence, cigarettes, uncomplicated: Secondary | ICD-10-CM

## 2021-12-19 DIAGNOSIS — R7401 Elevation of levels of liver transaminase levels: Secondary | ICD-10-CM

## 2021-12-19 DIAGNOSIS — F1910 Other psychoactive substance abuse, uncomplicated: Secondary | ICD-10-CM

## 2021-12-19 HISTORY — PX: I & D EXTREMITY: SHX5045

## 2021-12-19 LAB — HIV ANTIBODY (ROUTINE TESTING W REFLEX): HIV Screen 4th Generation wRfx: NONREACTIVE

## 2021-12-19 LAB — COMPREHENSIVE METABOLIC PANEL
ALT: 127 U/L — ABNORMAL HIGH (ref 0–44)
AST: 106 U/L — ABNORMAL HIGH (ref 15–41)
Albumin: 3.6 g/dL (ref 3.5–5.0)
Alkaline Phosphatase: 64 U/L (ref 38–126)
Anion gap: 7 (ref 5–15)
BUN: 10 mg/dL (ref 6–20)
CO2: 27 mmol/L (ref 22–32)
Calcium: 8.7 mg/dL — ABNORMAL LOW (ref 8.9–10.3)
Chloride: 103 mmol/L (ref 98–111)
Creatinine, Ser: 0.87 mg/dL (ref 0.61–1.24)
GFR, Estimated: >60 mL/min (ref >60)
Glucose, Bld: 124 mg/dL — ABNORMAL HIGH (ref 70–99)
Potassium: 3.3 mmol/L — ABNORMAL LOW (ref 3.5–5.1)
Sodium: 137 mmol/L (ref 135–145)
Total Bilirubin: 0.7 mg/dL (ref 0.3–1.2)
Total Protein: 6.9 g/dL (ref 6.5–8.1)

## 2021-12-19 LAB — CBC WITH DIFFERENTIAL/PLATELET
Abs Immature Granulocytes: 0.05 10*3/uL (ref 0.00–0.07)
Basophils Absolute: 0.1 10*3/uL (ref 0.0–0.1)
Basophils Relative: 0 %
Eosinophils Absolute: 0.3 10*3/uL (ref 0.0–0.5)
Eosinophils Relative: 2 %
HCT: 36.6 % — ABNORMAL LOW (ref 39.0–52.0)
Hemoglobin: 12.8 g/dL — ABNORMAL LOW (ref 13.0–17.0)
Immature Granulocytes: 0 %
Lymphocytes Relative: 7 %
Lymphs Abs: 0.9 10*3/uL (ref 0.7–4.0)
MCH: 33.8 pg (ref 26.0–34.0)
MCHC: 35 g/dL (ref 30.0–36.0)
MCV: 96.6 fL (ref 80.0–100.0)
Monocytes Absolute: 1 10*3/uL (ref 0.1–1.0)
Monocytes Relative: 8 %
Neutro Abs: 11.3 10*3/uL — ABNORMAL HIGH (ref 1.7–7.7)
Neutrophils Relative %: 83 %
Platelets: 287 10*3/uL (ref 150–400)
RBC: 3.79 MIL/uL — ABNORMAL LOW (ref 4.22–5.81)
RDW: 12.4 % (ref 11.5–15.5)
WBC: 13.7 10*3/uL — ABNORMAL HIGH (ref 4.0–10.5)
nRBC: 0 % (ref 0.0–0.2)

## 2021-12-19 LAB — SEDIMENTATION RATE: Sed Rate: 35 mm/hr — ABNORMAL HIGH (ref 0–16)

## 2021-12-19 LAB — HEPATITIS PANEL, ACUTE
HCV Ab: NONREACTIVE
Hep A IgM: NONREACTIVE
Hep B C IgM: NONREACTIVE
Hepatitis B Surface Ag: NONREACTIVE

## 2021-12-19 LAB — LACTIC ACID, PLASMA: Lactic Acid, Venous: 0.9 mmol/L (ref 0.5–1.9)

## 2021-12-19 LAB — C-REACTIVE PROTEIN: CRP: 8.3 mg/dL — ABNORMAL HIGH (ref <1.0)

## 2021-12-19 SURGERY — IRRIGATION AND DEBRIDEMENT EXTREMITY
Anesthesia: General | Laterality: Right

## 2021-12-19 MED ORDER — PROPOFOL 10 MG/ML IV BOLUS
INTRAVENOUS | Status: AC
  Filled 2021-12-19: qty 20

## 2021-12-19 MED ORDER — VANCOMYCIN HCL 500 MG IV SOLR: 500.0000 mg | Freq: Once | INTRAVENOUS | Status: DC

## 2021-12-19 MED ORDER — HALOPERIDOL 5 MG PO TABS
2.5000 mg | ORAL_TABLET | Freq: Once | ORAL | Status: AC
  Administered 2021-12-19: 2.5 mg via ORAL
  Filled 2021-12-19: qty 1

## 2021-12-19 MED ORDER — LACTATED RINGERS IV SOLN: INTRAVENOUS | Status: DC | PRN

## 2021-12-19 MED ORDER — CHLORHEXIDINE GLUCONATE 4 % EX LIQD: 60.0000 mL | Freq: Once | CUTANEOUS | Status: DC

## 2021-12-19 MED ORDER — MIDAZOLAM HCL 2 MG/2ML IJ SOLN
INTRAMUSCULAR | Status: AC
  Filled 2021-12-19: qty 2

## 2021-12-19 MED ORDER — CEFAZOLIN SODIUM-DEXTROSE 1-4 GM/50ML-% IV SOLN
1.0000 g | Freq: Once | INTRAVENOUS | Status: AC
  Administered 2021-12-19: 1 g via INTRAVENOUS
  Filled 2021-12-19: qty 50

## 2021-12-19 MED ORDER — CHLORHEXIDINE GLUCONATE 0.12 % MT SOLN
OROMUCOSAL | Status: AC
  Filled 2021-12-19: qty 15

## 2021-12-19 MED ORDER — HYDROMORPHONE HCL 1 MG/ML IJ SOLN
0.2500 mg | INTRAMUSCULAR | Status: DC | PRN
  Administered 2021-12-19 (×2): 0.5 mg via INTRAVENOUS

## 2021-12-19 MED ORDER — ALBUTEROL SULFATE (2.5 MG/3ML) 0.083% IN NEBU: 2.5000 mg | INHALATION_SOLUTION | Freq: Four times a day (QID) | RESPIRATORY_TRACT | Status: DC | PRN

## 2021-12-19 MED ORDER — FENTANYL CITRATE (PF) 250 MCG/5ML IJ SOLN
INTRAMUSCULAR | Status: AC
  Filled 2021-12-19: qty 5

## 2021-12-19 MED ORDER — SODIUM CHLORIDE 0.9 % IV SOLN
2.0000 g | INTRAVENOUS | Status: DC
  Administered 2021-12-19 – 2021-12-21 (×3): 2 g via INTRAVENOUS
  Filled 2021-12-19 (×3): qty 20

## 2021-12-19 MED ORDER — CEFAZOLIN SODIUM-DEXTROSE 2-4 GM/100ML-% IV SOLN
2.0000 g | INTRAVENOUS | Status: DC
  Filled 2021-12-19: qty 100

## 2021-12-19 MED ORDER — MIDAZOLAM HCL 2 MG/2ML IJ SOLN
INTRAMUSCULAR | Status: DC | PRN
  Administered 2021-12-19: 2 mg via INTRAVENOUS

## 2021-12-19 MED ORDER — SODIUM CHLORIDE 0.9% FLUSH
3.0000 mL | Freq: Two times a day (BID) | INTRAVENOUS | Status: DC
  Administered 2021-12-19 – 2021-12-26 (×12): 3 mL via INTRAVENOUS

## 2021-12-19 MED ORDER — LACTATED RINGERS IV SOLN: INTRAVENOUS | Status: DC

## 2021-12-19 MED ORDER — VANCOMYCIN HCL IN DEXTROSE 1-5 GM/200ML-% IV SOLN
1000.0000 mg | Freq: Once | INTRAVENOUS | Status: AC
  Administered 2021-12-19: 1000 mg via INTRAVENOUS
  Filled 2021-12-19: qty 200

## 2021-12-19 MED ORDER — ACETAMINOPHEN 650 MG RE SUPP: 650.0000 mg | Freq: Four times a day (QID) | RECTAL | Status: DC | PRN

## 2021-12-19 MED ORDER — OXYCODONE-ACETAMINOPHEN 5-325 MG PO TABS
1.0000 | ORAL_TABLET | Freq: Once | ORAL | Status: AC
  Administered 2021-12-19: 1 via ORAL
  Filled 2021-12-19: qty 1

## 2021-12-19 MED ORDER — ONDANSETRON HCL 4 MG/2ML IJ SOLN: 4.0000 mg | Freq: Four times a day (QID) | INTRAMUSCULAR | Status: DC | PRN

## 2021-12-19 MED ORDER — BUPIVACAINE HCL (PF) 0.25 % IJ SOLN
INTRAMUSCULAR | Status: AC
  Filled 2021-12-19: qty 30

## 2021-12-19 MED ORDER — MUPIROCIN 2 % EX OINT
1.0000 | TOPICAL_OINTMENT | Freq: Two times a day (BID) | CUTANEOUS | Status: AC
  Administered 2021-12-19 – 2021-12-24 (×10): 1 via NASAL
  Filled 2021-12-19 (×4): qty 22

## 2021-12-19 MED ORDER — CHLORHEXIDINE GLUCONATE CLOTH 2 % EX PADS
6.0000 | MEDICATED_PAD | Freq: Every day | CUTANEOUS | Status: AC
  Administered 2021-12-20 – 2021-12-24 (×5): 6 via TOPICAL

## 2021-12-19 MED ORDER — OXYCODONE HCL 5 MG PO TABS
5.0000 mg | ORAL_TABLET | ORAL | Status: DC | PRN
  Administered 2021-12-19 – 2021-12-25 (×16): 5 mg via ORAL
  Filled 2021-12-19 (×16): qty 1

## 2021-12-19 MED ORDER — HYDRALAZINE HCL 20 MG/ML IJ SOLN: 10.0000 mg | INTRAMUSCULAR | Status: DC | PRN

## 2021-12-19 MED ORDER — BUPIVACAINE HCL (PF) 0.25 % IJ SOLN
INTRAMUSCULAR | Status: DC | PRN
  Administered 2021-12-19: 10 mL

## 2021-12-19 MED ORDER — HYDROMORPHONE HCL 1 MG/ML IJ SOLN
INTRAMUSCULAR | Status: AC
  Filled 2021-12-19: qty 1

## 2021-12-19 MED ORDER — ONDANSETRON HCL 4 MG PO TABS: 4.0000 mg | ORAL_TABLET | Freq: Four times a day (QID) | ORAL | Status: DC | PRN

## 2021-12-19 MED ORDER — LIDOCAINE 2% (20 MG/ML) 5 ML SYRINGE
INTRAMUSCULAR | Status: DC | PRN
  Administered 2021-12-19: 30 mg via INTRAVENOUS

## 2021-12-19 MED ORDER — POVIDONE-IODINE 10 % EX SWAB
2.0000 | Freq: Once | CUTANEOUS | Status: AC
Start: 2021-12-19 — End: 2021-12-19
  Administered 2021-12-19: 2 via TOPICAL

## 2021-12-19 MED ORDER — ACETAMINOPHEN 325 MG PO TABS: 650.0000 mg | ORAL_TABLET | Freq: Four times a day (QID) | ORAL | Status: DC | PRN

## 2021-12-19 MED ORDER — FENTANYL CITRATE (PF) 250 MCG/5ML IJ SOLN
INTRAMUSCULAR | Status: DC | PRN
  Administered 2021-12-19 (×2): 50 ug via INTRAVENOUS

## 2021-12-19 MED ORDER — VANCOMYCIN HCL 1500 MG/300ML IV SOLN
1500.0000 mg | Freq: Two times a day (BID) | INTRAVENOUS | Status: AC
  Administered 2021-12-19 – 2021-12-26 (×15): 1500 mg via INTRAVENOUS
  Filled 2021-12-19 (×16): qty 300

## 2021-12-19 MED ORDER — PROPOFOL 10 MG/ML IV BOLUS
INTRAVENOUS | Status: DC | PRN
  Administered 2021-12-19: 200 mg via INTRAVENOUS

## 2021-12-19 MED ORDER — ENOXAPARIN SODIUM 40 MG/0.4ML IJ SOSY
40.0000 mg | PREFILLED_SYRINGE | INTRAMUSCULAR | Status: DC
  Administered 2021-12-19 – 2021-12-26 (×8): 40 mg via SUBCUTANEOUS
  Filled 2021-12-19 (×8): qty 0.4

## 2021-12-19 MED ORDER — SODIUM CHLORIDE 0.9 % IR SOLN
Status: DC | PRN
  Administered 2021-12-19: 1000 mL

## 2021-12-19 SURGICAL SUPPLY — 60 items
BAG COUNTER SPONGE SURGICOUNT (BAG) ×2 IMPLANT
BNDG COHESIVE 1X5 TAN STRL LF (GAUZE/BANDAGES/DRESSINGS) IMPLANT
BNDG CONFORM 2 STRL LF (GAUZE/BANDAGES/DRESSINGS) IMPLANT
BNDG ELASTIC 3X5.8 VLCR STR LF (GAUZE/BANDAGES/DRESSINGS) ×1 IMPLANT
BNDG ELASTIC 4X5.8 VLCR STR LF (GAUZE/BANDAGES/DRESSINGS) ×2 IMPLANT
BNDG ESMARK 4X9 LF (GAUZE/BANDAGES/DRESSINGS) ×1 IMPLANT
BNDG GAUZE ELAST 4 BULKY (GAUZE/BANDAGES/DRESSINGS) ×2 IMPLANT
BNDG STRETCH 4X75 NS LF (GAUZE/BANDAGES/DRESSINGS) ×1 IMPLANT
CORD BIPOLAR FORCEPS 12FT (ELECTRODE) ×2 IMPLANT
COVER SURGICAL LIGHT HANDLE (MISCELLANEOUS) ×2 IMPLANT
CUFF TOURN SGL QUICK 18X4 (TOURNIQUET CUFF) ×2 IMPLANT
CUFF TOURN SGL QUICK 24 (TOURNIQUET CUFF)
CUFF TRNQT CYL 24X4X16.5-23 (TOURNIQUET CUFF) IMPLANT
DRAIN PENROSE 1/4X12 LTX STRL (WOUND CARE) IMPLANT
DRAPE SURG 17X23 STRL (DRAPES) ×2 IMPLANT
DRSG ADAPTIC 3X8 NADH LF (GAUZE/BANDAGES/DRESSINGS) ×2 IMPLANT
ELECT REM PT RETURN 9FT ADLT (ELECTROSURGICAL)
ELECTRODE REM PT RTRN 9FT ADLT (ELECTROSURGICAL) IMPLANT
GAUZE SPONGE 4X4 12PLY STRL (GAUZE/BANDAGES/DRESSINGS) ×2 IMPLANT
GAUZE XEROFORM 1X8 LF (GAUZE/BANDAGES/DRESSINGS) ×1 IMPLANT
GAUZE XEROFORM 5X9 LF (GAUZE/BANDAGES/DRESSINGS) IMPLANT
GLOVE BIO SURGEON STRL SZ7.5 (GLOVE) ×2 IMPLANT
GLOVE BIOGEL PI IND STRL 8.5 (GLOVE) ×1 IMPLANT
GLOVE BIOGEL PI INDICATOR 8.5 (GLOVE) ×1
GLOVE SURG ORTHO 8.0 STRL STRW (GLOVE) ×2 IMPLANT
GLOVE SURG UNDER POLY LF SZ7.5 (GLOVE) ×4 IMPLANT
GOWN STRL REUS W/ TWL LRG LVL3 (GOWN DISPOSABLE) ×3 IMPLANT
GOWN STRL REUS W/ TWL XL LVL3 (GOWN DISPOSABLE) ×1 IMPLANT
GOWN STRL REUS W/TWL LRG LVL3 (GOWN DISPOSABLE) ×3
GOWN STRL REUS W/TWL XL LVL3 (GOWN DISPOSABLE) ×1
HANDPIECE INTERPULSE COAX TIP (DISPOSABLE)
KIT BASIN OR (CUSTOM PROCEDURE TRAY) ×2 IMPLANT
KIT TURNOVER KIT B (KITS) ×2 IMPLANT
MANIFOLD NEPTUNE II (INSTRUMENTS) ×2 IMPLANT
NDL HYPO 25GX1X1/2 BEV (NEEDLE) IMPLANT
NEEDLE HYPO 25GX1X1/2 BEV (NEEDLE) IMPLANT
NS IRRIG 1000ML POUR BTL (IV SOLUTION) ×2 IMPLANT
PACK ORTHO EXTREMITY (CUSTOM PROCEDURE TRAY) ×2 IMPLANT
PAD ARMBOARD 7.5X6 YLW CONV (MISCELLANEOUS) ×4 IMPLANT
PAD CAST 4YDX4 CTTN HI CHSV (CAST SUPPLIES) ×1 IMPLANT
PADDING CAST COTTON 4X4 STRL (CAST SUPPLIES)
SET CYSTO W/LG BORE CLAMP LF (SET/KITS/TRAYS/PACK) IMPLANT
SET HNDPC FAN SPRY TIP SCT (DISPOSABLE) IMPLANT
SOAP 2 % CHG 4 OZ (WOUND CARE) ×2 IMPLANT
SPONGE T-LAP 18X18 ~~LOC~~+RFID (SPONGE) ×1 IMPLANT
SPONGE T-LAP 4X18 ~~LOC~~+RFID (SPONGE) ×1 IMPLANT
SUT CHROMIC 4 0 PS 5 (SUTURE) ×1 IMPLANT
SUT ETHILON 4 0 PS 2 18 (SUTURE) IMPLANT
SUT ETHILON 5 0 P 3 18 (SUTURE)
SUT MNCRL AB 3-0 PS2 18 (SUTURE) ×1 IMPLANT
SUT NYLON ETHILON 5-0 P-3 1X18 (SUTURE) IMPLANT
SWAB COLLECTION DEVICE MRSA (MISCELLANEOUS) ×2 IMPLANT
SWAB CULTURE ESWAB REG 1ML (MISCELLANEOUS) IMPLANT
SYR CONTROL 10ML LL (SYRINGE) IMPLANT
TOWEL GREEN STERILE (TOWEL DISPOSABLE) ×2 IMPLANT
TOWEL GREEN STERILE FF (TOWEL DISPOSABLE) ×2 IMPLANT
TUBE CONNECTING 12X1/4 (SUCTIONS) ×2 IMPLANT
UNDERPAD 30X36 HEAVY ABSORB (UNDERPADS AND DIAPERS) ×2 IMPLANT
WATER STERILE IRR 1000ML POUR (IV SOLUTION) ×2 IMPLANT
YANKAUER SUCT BULB TIP NO VENT (SUCTIONS) ×2 IMPLANT

## 2021-12-19 NOTE — H&P (Signed)
  History and Physical    Patient: Eric Woods PIR:[id number] DOB: 01/14/1976 DOA: 12/18/2021 DOS: the patient was seen and examined on 12/19/2021 PCP: Patient, No Pcp Per  Patient coming from: {Point_of_Origin:[address]}  Chief Complaint:  Chief Complaint  Patient presents with   Wound Infection   HPI: Eric Woods is a 46 y.o. male with medical history significant of  IV drug abuse  Patient reportedly got out of prison on June 29, but while in prison he had received a tetanus booster.  Patient emergency department patient was seen to be mildly tachycardic with blood pressures elevated up to 159/106, and all other vital signs maintained. Labs significant for WBC 13.7, hemoglobin 12.8, potassium 3.3, calcium 8.7, AST 106, ALT 127.  X-rays of the right hand did not note any acute abnormality.  Physical exam was concerning for tensor tenosynovitis.  Patient had been started on vancomycin case had been discussed with Dr. Yehuda Budd of orthopedics who recommended continuing IV antibiotics. Review of Systems: {ROS_Text:26778} History reviewed. No pertinent past medical history. Past Surgical History:  Procedure Laterality Date   ABDOMINAL SURGERY     Social History:  reports that he has been smoking cigarettes. He does not have any smokeless tobacco history on file. He reports current alcohol use. He reports current drug use. Drug: Marijuana.  Allergies  Allergen Reactions   Penicillins Other (See Comments)    Childhood allergy    History reviewed. No pertinent family history.  Prior to Admission medications   Not on File    Physical Exam: Vitals:   12/19/21 0700 12/19/21 0900 12/19/21 1132 12/19/21 1410  BP: (!) 159/106 (!) 145/94 (!) 148/78 (!) 142/100  Pulse: 61 67 68 66  Resp: 20 18 16 16   Temp:   98.4 F (36.9 C) 99.2 F (37.3 C)  TempSrc:    Oral  SpO2: 99% 99% 99% 100%  Weight:      Height:       *** Data Reviewed: {Tip this will not be part of the note when  signed- Document your independent interpretation of telemetry tracing, EKG, lab, Radiology test or any other diagnostic tests. Add any new diagnostic test ordered today. (Optional):26781} {Results:26384}  Assessment and Plan: No notes have been filed under this hospital service. Service: Hospitalist     Advance Care Planning:   Code Status: Not on file ***  Consults: ***  Family Communication: ***  Severity of Illness: {Observation/Inpatient:21159}  Author: , MD 12/19/2021 3:36 PM  For on call review [email or web address].

## 2021-12-19 NOTE — Anesthesia Postprocedure Evaluation (Signed)
Anesthesia Post Note  Patient: Eric Woods  Procedure(s) Performed: IRRIGATION AND DEBRIDEMENT EXTREMITY (Right)     Patient location during evaluation: PACU Anesthesia Type: General Level of consciousness: awake and alert Pain management: pain level controlled Vital Signs Assessment: post-procedure vital signs reviewed and stable Respiratory status: spontaneous breathing, nonlabored ventilation and respiratory function stable Cardiovascular status: stable and blood pressure returned to baseline Anesthetic complications: no   No notable events documented.  Last Vitals:  Vitals:   12/19/21 2010 12/19/21 2015  BP:  (!) 139/96  Pulse: 62 68  Resp: 18 19  Temp:  36.9 C  SpO2: 95% 94%    Last Pain:  Vitals:   12/19/21 2015  TempSrc:   PainSc: Asleep                 Beryle Lathe
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
## 2021-12-19 NOTE — Anesthesia Procedure Notes (Addendum)
Procedure Name: LMA Insertion Date/Time: 12/19/2021 6:45 PM  Performed by: Gwenyth Allegra, CRNAPre-anesthesia Checklist: Patient identified, Emergency Drugs available, Suction available and Patient being monitored Patient Re-evaluated:Patient Re-evaluated prior to induction Oxygen Delivery Method: Circle system utilized Preoxygenation: Pre-oxygenation with 100% oxygen LMA: LMA inserted LMA Size: 5.0 Placement Confirmation: positive ETCO2 and breath sounds checked- equal and bilateral Dental Injury: Teeth and Oropharynx as per pre-operative assessment

## 2021-12-19 NOTE — ED Notes (Signed)
Pt resting quietly with eyes closed

## 2021-12-19 NOTE — Consult Note (Signed)
Reason for Consult:Right long finger infection Referring Physician: Madelyn Flavors Time called: 1535 Time at bedside: 1542   Eric Woods is an 46 y.o. male.  HPI: Eric Woods has had 2-3d of right long finger pain and swelling. It began out of the blue and he does not remember any antecedent event. It got bad quickly and has stayed that way. He went to [facility] for evaluation and was admitted to [facility] for definitive treatment. He denies prior e/o, fevers, chills, sweats, or N/V. He is RHD.  History reviewed. No pertinent past medical history.  Past Surgical History:  Procedure Laterality Date   ABDOMINAL SURGERY      History reviewed. No pertinent family history.  Social History:  reports that he has been smoking cigarettes. He does not have any smokeless tobacco history on file. He reports current alcohol use. He reports current drug use. Drug: Marijuana.  Allergies:  Allergies  Allergen Reactions   Penicillins Other (See Comments)    Childhood allergy    Medications: I have reviewed the patient's current medications.  Results for orders placed or performed during the hospital encounter of 12/18/21 (from the past 48 hour(s))  CBC with Differential     Status: Abnormal   Collection Time: 12/19/21 12:27 AM  Result Value Ref Range   WBC 13.7 (H) 4.0 - 10.5 K/uL   RBC 3.79 (L) 4.22 - 5.81 MIL/uL   Hemoglobin 12.8 (L) 13.0 - 17.0 g/dL   HCT 02.5 (L) 42.7 - 06.2 %   MCV 96.6 80.0 - 100.0 fL   MCH 33.8 26.0 - 34.0 pg   MCHC 35.0 30.0 - 36.0 g/dL   RDW 37.6 28.3 - 15.1 %   Platelets 287 150 - 400 K/uL   nRBC 0.0 0.0 - 0.2 %   Neutrophils Relative % 83 %   Neutro Abs 11.3 (H) 1.7 - 7.7 K/uL   Lymphocytes Relative 7 %   Lymphs Abs 0.9 0.7 - 4.0 K/uL   Monocytes Relative 8 %   Monocytes Absolute 1.0 0.1 - 1.0 K/uL   Eosinophils Relative 2 %   Eosinophils Absolute 0.3 0.0 - 0.5 K/uL   Basophils Relative 0 %   Basophils Absolute 0.1 0.0 - 0.1 K/uL   Immature Granulocytes 0 %   Abs  Immature Granulocytes 0.05 0.00 - 0.07 K/uL    Comment: Performed at [facility], [address] [place], [place] [address]  Comprehensive metabolic panel     Status: Abnormal   Collection Time: 12/19/21 12:27 AM  Result Value Ref Range   Sodium 137 135 - 145 mmol/L   Potassium 3.3 (L) 3.5 - 5.1 mmol/L   Chloride 103 98 - 111 mmol/L   CO2 27 22 - 32 mmol/L   Glucose, Bld 124 (H) 70 - 99 mg/dL    Comment: Glucose reference range applies only to samples taken after fasting for at least 8 hours.   BUN 10 6 - 20 mg/dL   Creatinine, Ser 7.37 0.61 - 1.24 mg/dL   Calcium 8.7 (L) 8.9 - 10.3 mg/dL   Total Protein 6.9 6.5 - 8.1 g/dL   Albumin 3.6 3.5 - 5.0 g/dL   AST 106 (H) 15 - 41 U/L   ALT 127 (H) 0 - 44 U/L   Alkaline Phosphatase 64 38 - 126 U/L   Total Bilirubin 0.7 0.3 - 1.2 mg/dL   GFR, Estimated >26 >94 mL/min    Comment: (NOTE) Calculated using the CKD-EPI Creatinine Equation (2021)  Anion gap 7 5 - 15    Comment: Performed at [facility], [address] [place], [place] [address]    DG Hand Complete Right  Result Date: 12/19/2021 CLINICAL DATA:  Hand pain, no known injury. EXAM: RIGHT HAND - COMPLETE 3+ VIEW COMPARISON:  None Available. FINDINGS: There is no evidence of fracture or dislocation. There is no evidence of arthropathy or other focal bone abnormality. Soft tissues are unremarkable. IMPRESSION: Negative. Electronically Signed   By: Charlett Nose M.D.   On: 12/19/2021 00:48    Review of Systems  HENT:  Negative for ear discharge, ear pain, hearing loss and tinnitus.   Eyes:  Negative for photophobia and pain.  Respiratory:  Negative for cough and shortness of breath.   Cardiovascular:  Negative for chest pain.  Gastrointestinal:  Negative for abdominal pain, nausea and vomiting.  Genitourinary:  Negative for dysuria, flank pain, frequency and urgency.  Musculoskeletal:  Positive for arthralgias (Right long finger/hand). Negative for back  pain, myalgias and neck pain.  Neurological:  Negative for dizziness and headaches.  Hematological:  Does not bruise/bleed easily.  Psychiatric/Behavioral:  The patient is not nervous/anxious.    Blood pressure (!) 142/100, pulse 66, temperature 99.2 F (37.3 C), temperature source Oral, resp. rate 16, height 6' (1.829 m), weight 93 kg, SpO2 100 %. Physical Exam Constitutional:      General: He is not in acute distress.    Appearance: He is well-developed. He is not diaphoretic.  HENT:     Head: Normocephalic and atraumatic.  Eyes:     General: No scleral icterus.       Right eye: No discharge.        Left eye: No discharge.     Conjunctiva/sclera: Conjunctivae normal.  Cardiovascular:     Rate and Rhythm: Normal rate and regular rhythm.  Pulmonary:     Effort: Pulmonary effort is normal. No respiratory distress.  Musculoskeletal:     Cervical back: Normal range of motion.     Comments: Right shoulder, elbow, wrist, digits- no skin wounds, fusiform edema long finger, severe TTP long finger and into palm, mod TTP P1 ring finger, severe pain with passive extension long finger, no instability, no blocks to motion  Sens  Ax/R/M/U intact  Mot   Ax/ R/ PIN/ M/ AIN/ U intact  Rad 2+  Skin:    General: Skin is warm and dry.  Neurological:     Mental Status: He is alert.  Psychiatric:        Mood and Affect: Mood normal.        Behavior: Behavior normal.     Assessment/Plan: Right long finger tenosynovitis -- Plan I&D tonight by Dr. Yehuda Budd. Please keep NPO.    Freeman Caldron, PA-C Orthopedic Surgery [phone] 12/19/2021, 3:49 PM

## 2021-12-19 NOTE — Progress Notes (Signed)
Mr. Linhardt admitted to 85w33. No complaints of pain. Light sensitive, dim lighting in room. CHG bath given. Oriented to room, call light and bed controls. Bed placed in lowest position.

## 2021-12-19 NOTE — Anesthesia Preprocedure Evaluation (Addendum)
Anesthesia Evaluation  Patient identified by MRN, date of birth, ID band Patient awake    Reviewed: Allergy & Precautions, H&P , NPO status , Patient's Chart, lab work & pertinent test results  Airway Mallampati: II  TM Distance: >3 FB Neck ROM: Full    Dental  (+) Edentulous Lower, Edentulous Upper   Pulmonary Current Smoker and Patient abstained from smoking.,    Pulmonary exam normal        Cardiovascular hypertension (noncompliant), Normal cardiovascular exam     Neuro/Psych negative neurological ROS  negative psych ROS   GI/Hepatic negative GI ROS, (+)     substance abuse (~24 hr ago)  marijuana use and methamphetamine use,   Endo/Other  negative endocrine ROS  Renal/GU negative Renal ROS  negative genitourinary   Musculoskeletal   Abdominal   Peds  Hematology negative hematology ROS (+)   Anesthesia Other Findings   Reproductive/Obstetrics negative OB ROS                            Anesthesia Physical Anesthesia Plan  ASA: 3  Anesthesia Plan: General   Post-op Pain Management: Ofirmev IV (intra-op)*, Toradol IV (intra-op)* and Precedex   Induction: Intravenous  PONV Risk Score and Plan: 2 and Ondansetron, Dexamethasone, Midazolam and Treatment may vary due to age or medical condition  Airway Management Planned: LMA  Additional Equipment: None  Intra-op Plan:   Post-operative Plan: Extubation in OR  Informed Consent: I have reviewed the patients History and Physical, chart, labs and discussed the procedure including the risks, benefits and alternatives for the proposed anesthesia with the patient or authorized representative who has indicated his/her understanding and acceptance.     Dental advisory given  Plan Discussed with: CRNA and Anesthesiologist  Anesthesia Plan Comments:        Anesthesia Quick Evaluation

## 2021-12-19 NOTE — Transfer of Care (Signed)
Immediate Anesthesia Transfer of Care Note  Patient: Eric Woods  Procedure(s) Performed: IRRIGATION AND DEBRIDEMENT EXTREMITY (Right)  Patient Location: PACU  Anesthesia Type:General  Level of Consciousness: awake and alert   Airway & Oxygen Therapy: Patient Spontanous Breathing  Post-op Assessment: Report given to RN and Post -op Vital signs reviewed and stable  Post vital signs: Reviewed and stable  Last Vitals:  Vitals Value Taken Time  BP 164/103 12/19/21 1937  Temp    Pulse 56 12/19/21 1939  Resp 8 12/19/21 1939  SpO2 100 % 12/19/21 1939  Vitals shown include unvalidated device data.  Last Pain:  Vitals:   12/19/21 1513  TempSrc:   PainSc: Asleep         Complications: No notable events documented.

## 2021-12-19 NOTE — ED Provider Notes (Signed)
[facility] EMERGENCY DEPARTMENT Provider Note   CSN: [id number] Arrival date & time: 12/18/21  2116     History  Chief Complaint  Patient presents with   Wound Infection    Eric Woods is a 46 y.o. male.  The history is provided by the patient and medical records.  Eric Woods is a 46 y.o. male who presents to the Emergency Department complaining of wound infection.  He presents to the emergency department with concern for wound infection.  He states that he has wounds all over his body that have been bothering him for several days.  The ones that are the most bothersome is the right middle finger that is been hurting him since yesterday.  He states that he stuck a needle all over it yesterday and did get a little bit of pus out and it is slightly better.  He also complains of sores and pain around his mouth.  No fevers, nausea, vomiting.  He has no known medical problems.  He got out of prison June 29.  Tetanus updated while in prison, he is not exactly sure of the date.  He uses tobacco.  He uses methamphetamine (smoke and snort) as well as marijuana.      Home Medications Prior to Admission medications   Medication Sig Start Date End Date Taking? Authorizing Provider  HYDROcodone-acetaminophen (NORCO/VICODIN) 5-325 MG per tablet Take one-two tabs po q 4-6 hrs prn pain 05/21/14   Triplett, Tammy, PA-C  ibuprofen (ADVIL,MOTRIN) 600 MG tablet Take 1 tablet (600 mg total) by mouth every 6 (six) hours as needed. 09/23/14   Derwood Kaplan, MD  naproxen (NAPROSYN) 500 MG tablet Take 1 tablet (500 mg total) by mouth 2 (two) times daily with a meal. 05/21/14   Triplett, Tammy, PA-C  sulfamethoxazole-trimethoprim (SEPTRA DS) 800-160 MG per tablet Take 1 tablet by mouth 2 (two) times daily. For 10 days 12/31/13   Pauline Aus, PA-C      Allergies    Penicillins    Review of Systems   Review of Systems  All other systems reviewed and are negative.   Physical  Exam Updated Vital Signs BP (!) 149/99   Pulse 63   Temp 98.5 F (36.9 C) (Oral)   Resp 20   Ht 6' (1.829 m)   Wt 93 kg   SpO2 98%   BMI 27.80 kg/m  Physical Exam Vitals and nursing note reviewed.  Constitutional:      Appearance: He is well-developed.  HENT:     Head: Normocephalic and atraumatic.     Comments: Multiple ulcerations to the face over the forehead and periorbital region.  There is some crusting to the periorbital region. Cardiovascular:     Rate and Rhythm: Normal rate and regular rhythm.     Heart sounds: No murmur heard. Pulmonary:     Effort: Pulmonary effort is normal. No respiratory distress.     Breath sounds: Normal breath sounds.  Abdominal:     Palpations: Abdomen is soft.     Tenderness: There is no abdominal tenderness. There is no guarding or rebound.  Musculoskeletal:        General: Swelling and tenderness present.     Comments: There is soft tissue swelling, erythema and tenderness throughout the right third digit.  Patient is unable to passively or actively flex the digit.  He has pain over all surfaces of the digit but has greatest pain over the flexor surface.  Skin:    General:  Skin is warm and dry.     Findings: Rash present.  Neurological:     Mental Status: He is alert and oriented to person, place, and time.  Psychiatric:     Comments: Anxious appearing, frequently picking        ED Results / Procedures / Treatments   Labs (all labs ordered are listed, but only abnormal results are displayed) Labs Reviewed  CBC WITH DIFFERENTIAL/PLATELET - Abnormal; Notable for the following components:      Result Value   WBC 13.7 (*)    RBC 3.79 (*)    Hemoglobin 12.8 (*)    HCT 36.6 (*)    Neutro Abs 11.3 (*)    All other components within normal limits  COMPREHENSIVE METABOLIC PANEL - Abnormal; Notable for the following components:   Potassium 3.3 (*)    Glucose, Bld 124 (*)    Calcium 8.7 (*)    AST 106 (*)    ALT 127 (*)    All  other components within normal limits    EKG None  Radiology DG Hand Complete Right  Result Date: 12/19/2021 CLINICAL DATA:  Hand pain, no known injury. EXAM: RIGHT HAND - COMPLETE 3+ VIEW COMPARISON:  None Available. FINDINGS: There is no evidence of fracture or dislocation. There is no evidence of arthropathy or other focal bone abnormality. Soft tissues are unremarkable. IMPRESSION: Negative. Electronically Signed   By: Charlett Nose M.D.   On: 12/19/2021 00:48    Procedures Procedures    Medications Ordered in ED Medications  lactated ringers infusion ( Intravenous New Bag/Given 12/19/21 0555)  oxyCODONE (Oxy IR/ROXICODONE) immediate release tablet 5 mg (5 mg Oral Given 12/19/21 0555)  vancomycin (VANCOCIN) IVPB 1000 mg/200 mL premix (0 mg Intravenous Stopped 12/19/21 0246)  oxyCODONE-acetaminophen (PERCOCET/ROXICET) 5-325 MG per tablet 1 tablet (1 tablet Oral Given 12/19/21 0313)  haloperidol (HALDOL) tablet 2.5 mg (2.5 mg Oral Given 12/19/21 0546)  ceFAZolin (ANCEF) IVPB 1 g/50 mL premix (0 g Intravenous Stopped 12/19/21 7616)    ED Course/ Medical Decision Making/ A&P                           Medical Decision Making Amount and/or Complexity of Data Reviewed Labs: ordered. Radiology: ordered.  Risk Prescription drug management. Decision regarding hospitalization.   Patient with history of methamphetamine abuse here for evaluation of multiple sores as well as increased pain and swelling to the right third digit.  He has been putting needles in his finger to try and drain pus starting yesterday.  Exact timeline of his source is unclear.  Patient with frequent fidgeting and picking on evaluation.  Digit examination is concerning for flexor tenosynovitis.  In terms of his multiple additional wounds, no evidence of abscess or significant surrounding cellulitis.  He was started on broad-spectrum antibiotics.  Discussed with Dr. Yehuda Budd with orthopedics, recommends continuing IV  antibiotics and medicine admission-he can see the patient in consult.  Medicine consulted for admission for ongoing care.  Patient updated of findings of studies and recommendation for admission and he is in agreement with treatment plan.  During patient's ED stay he continued to have picking at multiple wounds-he was treated with Haldol for his symptoms.        Final Clinical Impression(s) / ED Diagnoses Final diagnoses:  Flexor tenosynovitis of finger  Methamphetamine abuse (HCC)    Rx / DC Orders ED Discharge Orders     None  Tilden Fossa, MD 12/19/21 [phone]

## 2021-12-19 NOTE — ED Notes (Signed)
[facility] at bedside for transport. 

## 2021-12-19 NOTE — Progress Notes (Signed)
Pharmacy Antibiotic Note  Eric Woods is a 46 y.o. male admitted on 12/18/2021 with  wound infection .  Pharmacy has been consulted for vancomycin dosing. Pt is afebrile and WBC is elevated at 13.7. SCr is WNL.   Plan: Vancomycin 1500mg  IV Q12H  F/u renal fxn, C&S, clinical status and peak/trough at SS  Height: 6' (182.9 cm) Weight: 93 kg (205 lb) IBW/kg (Calculated) : 77.6  Temp (24hrs), Avg:98.5 F (36.9 C), Min:98.5 F (36.9 C), Max:98.5 F (36.9 C)  Recent Labs  Lab 12/19/21 0027  WBC 13.7*  CREATININE 0.87    Estimated Creatinine Clearance: 117.7 mL/min (by C-G formula based on SCr of 0.87 mg/dL).    Allergies  Allergen Reactions   Penicillins Other (See Comments)    Childhood allergy    Antimicrobials this admission: Vanc 7/12>> CTX 7/12>> Cefazolin x 1 7/12  Dose adjustments this admission: N/A  Microbiology results: Pending  Thank you for allowing pharmacy to be a part of this patient's care.  Leonarda Leis, 9/12 12/19/2021 7:35 AM

## 2021-12-19 NOTE — Op Note (Signed)
OPERATIVE NOTE  DATE OF PROCEDURE: 12/19/2021  SURGEONS:  Primary: Orene Desanctis, MD  PREOPERATIVE DIAGNOSIS: infected right hand, middle finger flexor tenosynovitis  POSTOPERATIVE DIAGNOSIS: Same  NAME OF PROCEDURE:   Right middle finger incision and drainage of flexor tendon sheath  ANESTHESIA: General  SKIN PREPARATION: Hibiclens  ESTIMATED BLOOD LOSS: Minimal  IMPLANTS: none  INDICATIONS:  Man is a 46 y.o. male who has the above preoperative diagnosis. The patient has decided to proceed with surgical intervention.  Risks, benefits and alternatives of operative management were discussed including, but not limited to, risks of anesthesia complications, infection, pain, persistent symptoms, stiffness, need for future surgery.  The patient understands, agrees and elects to proceed with surgery.    DESCRIPTION OF PROCEDURE: The patient was met in the pre-operative area and their identity was verified.  The operative location and laterality was also verified and marked.  The patient was brought to the OR and was placed supine on the table.  After repeat patient identification with the operative team anesthesia was provided and the patient was prepped and draped in the usual sterile fashion.  A final timeout was performed verifying the correction patient, procedure, location and laterality.  The right upper extremity was elevated and tourniquet inflated to 250 mmHg.  A Bruner incision was made across the volar aspect of the right middle finger.  Skin and subcutaneous tissues were divided and careful hemostasis was obtained.  Attention was turned to protection of the neurovascular bundles radially and ulnarly and dissection was carried down to the tendon sheath.  There was significant purulence emanating from the tendon sheath about the A5 pulley.  Cultures were obtained.  Antibiotics were given.  The A5 pulley was released and the wound was thoroughly irrigated and incision within the tendon  sheath was performed and the tendon sheath was drained with help of a DLP device to flush the sheath.  There was inadequate decompression of the sheath with the close decompression within a 1 and a 5 pulley release and closed irrigation thus I did perform Bruner across the entire finger for wide exposure and decompression of the abscess and flexor tenosynovitis within the right middle finger flexor tendon sheath.  This was thoroughly irrigated and the skin edges were tacked down just at the corners leaving the wound primarily left open.  Sterile bandages were applied.  The tourniquet was deflated and the finger was pink and warm and well-perfused.  The patient tolerated the procedure well was awoken from anesthesia and brought to PACU for recovery in stable condition.  Postoperative plan: Dressing changes with Adaptic gauze and a soft wrap to be performed as needed.  I do expect some drainage from the wound and the dressing can be changed if it becomes saturated.  I would like him to then begin changing it daily starting postop day 2.  I would like to initiate hydrotherapy at that time.  Continue IV antibiotics and transition to p.o. antibiotics once medically stable and infection is cleared.  Continue to elevate and work on finger range of motion.  Follow-up in 1 week in my office for a wound check.   Matt Holmes, MD

## 2021-12-20 ENCOUNTER — Encounter (HOSPITAL_COMMUNITY): Payer: Self-pay | Admitting: Orthopedic Surgery

## 2021-12-20 DIAGNOSIS — F1510 Other stimulant abuse, uncomplicated: Secondary | ICD-10-CM

## 2021-12-20 DIAGNOSIS — F1910 Other psychoactive substance abuse, uncomplicated: Secondary | ICD-10-CM | POA: Diagnosis present

## 2021-12-20 DIAGNOSIS — R7401 Elevation of levels of liver transaminase levels: Secondary | ICD-10-CM | POA: Diagnosis present

## 2021-12-20 LAB — CBC
HCT: 37.9 % — ABNORMAL LOW (ref 39.0–52.0)
Hemoglobin: 13.1 g/dL (ref 13.0–17.0)
MCH: 33.9 pg (ref 26.0–34.0)
MCHC: 34.6 g/dL (ref 30.0–36.0)
MCV: 97.9 fL (ref 80.0–100.0)
Platelets: 258 10*3/uL (ref 150–400)
RBC: 3.87 MIL/uL — ABNORMAL LOW (ref 4.22–5.81)
RDW: 12.5 % (ref 11.5–15.5)
WBC: 12.6 10*3/uL — ABNORMAL HIGH (ref 4.0–10.5)
nRBC: 0 % (ref 0.0–0.2)

## 2021-12-20 LAB — COMPREHENSIVE METABOLIC PANEL
ALT: 87 U/L — ABNORMAL HIGH (ref 0–44)
AST: 50 U/L — ABNORMAL HIGH (ref 15–41)
Albumin: 3.1 g/dL — ABNORMAL LOW (ref 3.5–5.0)
Alkaline Phosphatase: 59 U/L (ref 38–126)
Anion gap: 9 (ref 5–15)
BUN: 6 mg/dL (ref 6–20)
CO2: 27 mmol/L (ref 22–32)
Calcium: 8.6 mg/dL — ABNORMAL LOW (ref 8.9–10.3)
Chloride: 103 mmol/L (ref 98–111)
Creatinine, Ser: 0.68 mg/dL (ref 0.61–1.24)
GFR, Estimated: >60 mL/min (ref >60)
Glucose, Bld: 131 mg/dL — ABNORMAL HIGH (ref 70–99)
Potassium: 3.7 mmol/L (ref 3.5–5.1)
Sodium: 139 mmol/L (ref 135–145)
Total Bilirubin: 0.8 mg/dL (ref 0.3–1.2)
Total Protein: 6.3 g/dL — ABNORMAL LOW (ref 6.5–8.1)

## 2021-12-20 LAB — SURGICAL PCR SCREEN
MRSA, PCR: POSITIVE — AB
Staphylococcus aureus: POSITIVE — AB

## 2021-12-20 MED ORDER — SODIUM CHLORIDE 0.9 % IV SOLN: INTRAVENOUS | Status: DC

## 2021-12-20 NOTE — Progress Notes (Addendum)
PROGRESS NOTE        PATIENT DETAILS Name: Eric Woods Age: 46 y.o. Sex: male Date of Birth: February 05, 1976 Admit Date: 12/18/2021 Admitting Physician Norval Morton, MD BP:[id number], No Pcp Per  Brief Summary: Patient is a 46 y.o.  male with history of methamphetamine use-presented with right middle finger infection.   Significant events: 7/11>> admit to [facility] for right middle finger infection.  Significant studies: 7/12>> x-ray right hand: No fracture/dislocation.  Significant microbiology data: 7/12>> right finger wound/intraoperative cultures:pending  Procedures: 7/12>>Right middle finger incision and drainage of flexor tendon sheath  Consults: Hand surgery.  Subjective: Lying comfortably in bed-denies any chest pain or shortness of breath.  Some pain in his right hand but appears comfortable.  Objective: Vitals: Blood pressure 120/77, pulse 79, temperature 98.5 F (36.9 C), temperature source Oral, resp. rate 19, height 6' (1.829 m), weight 93 kg, SpO2 100 %.   Exam: Gen Exam:Alert awake-not in any distress HEENT:atraumatic, normocephalic Chest: B/L clear to auscultation anteriorly CVS:S1S2 regular Abdomen:soft non tender, non distended Extremities:no edema Neurology: Non focal Skin: no rash  Pertinent Labs/Radiology:    Latest Ref Rng & Units 12/20/2021   12:42 AM 12/19/2021   12:27 AM 09/22/2014   11:25 PM  CBC  WBC 4.0 - 10.5 K/uL 12.6  13.7  9.2   Hemoglobin 13.0 - 17.0 g/dL 13.1  12.8  15.5   Hematocrit 39.0 - 52.0 % 37.9  36.6  44.7   Platelets 150 - 400 K/uL 258  287  279     Lab Results  Component Value Date   NA 139 12/20/2021   K 3.7 12/20/2021   CL 103 12/20/2021   CO2 27 12/20/2021      Assessment/Plan: Right middle finger infection: S/p I&D on 7/12-awaiting intraoperative cultures-continue broad-spectrum antibiotics.  Hand surgery following with plans to start hydrotherapy on 7/14.  Right auricle posterior  area tenderness: Has a tender spot in the posterior auricular area-unclear if this is a developing abscess-already on broad-spectrum antibiotics-we will watch for another day or so if he develops fluctuance-May need to consult ENT.  Transaminitis: Probably due to drug use-thankfully downtrending.  Acute hepatitis serology negative.  Continue to trend periodically.  Methamphetamine use: Denies IV use-only snorts.  Have counseled.  BMI: Estimated body mass index is 27.8 kg/m as calculated from the following:   Height as of this encounter: 6' (1.829 m).   Weight as of this encounter: 93 kg.   Code status:   Code Status: Full Code   DVT Prophylaxis: enoxaparin (LOVENOX) injection 40 mg Start: 12/19/21 1630   Family Communication: None at bedside   Disposition Plan: Status is: Inpatient Remains inpatient appropriate because: Right middle finger infection-noted stable for discharge-hydrotherapy plan from 7/14-on IV antibiotics.   Planned Discharge Destination:Home   Diet: Diet Order             Diet regular Room service appropriate? Yes; Fluid consistency: Thin  Diet effective now                     Antimicrobial agents: Anti-infectives (From admission, onward)    Start     Dose/Rate Route Frequency Ordered Stop   12/20/21 0600  ceFAZolin (ANCEF) IVPB 2g/100 mL premix  Status:  Discontinued        2 g 200 mL/hr over 30 Minutes  Intravenous On call to O.R. 12/19/21 1619 12/19/21 2034   12/19/21 2100  vancomycin (VANCOREADY) IVPB 1500 mg/300 mL        1,500 mg 150 mL/hr over 120 Minutes Intravenous Every 12 hours 12/19/21 0734     12/19/21 0900  vancomycin (VANCOCIN) 500 mg in sodium chloride 0.9 % 100 mL IVPB       See Hyperspace for full Linked Orders Report.   500 mg 100 mL/hr over 60 Minutes Intravenous  Once 12/19/21 0734     12/19/21 0800  cefTRIAXone (ROCEPHIN) 2 g in sodium chloride 0.9 % 100 mL IVPB        2 g 200 mL/hr over 30 Minutes Intravenous Every 24  hours 12/19/21 0729     12/19/21 0800  vancomycin (VANCOCIN) IVPB 1000 mg/200 mL premix       See Hyperspace for full Linked Orders Report.   1,000 mg 200 mL/hr over 60 Minutes Intravenous  Once 12/19/21 0734 12/19/21 1025   12/19/21 0600  ceFAZolin (ANCEF) IVPB 1 g/50 mL premix        1 g 100 mL/hr over 30 Minutes Intravenous  Once 12/19/21 0547 12/19/21 0637   12/19/21 0115  vancomycin (VANCOCIN) IVPB 1000 mg/200 mL premix        1,000 mg 200 mL/hr over 60 Minutes Intravenous  Once 12/19/21 0111 12/19/21 0246        MEDICATIONS: Scheduled Meds:  Chlorhexidine Gluconate Cloth  6 each Topical Q0600   enoxaparin (LOVENOX) injection  40 mg Subcutaneous Q24H   mupirocin ointment  1 Application Nasal BID   sodium chloride flush  3 mL Intravenous Q12H   Continuous Infusions:  cefTRIAXone (ROCEPHIN)  IV 2 g (12/20/21 0753)   vancomycin (VANCOCIN) 500 mg in sodium chloride 0.9 % 100 mL IVPB     vancomycin 1,500 mg (12/20/21 0950)   PRN Meds:.acetaminophen **OR** acetaminophen, albuterol, hydrALAZINE, ondansetron **OR** ondansetron (ZOFRAN) IV, oxyCODONE   I have personally reviewed following labs and imaging studies  LABORATORY DATA: CBC: Recent Labs  Lab 12/19/21 0027 12/20/21 0042  WBC 13.7* 12.6*  NEUTROABS 11.3*  --   HGB 12.8* 13.1  HCT 36.6* 37.9*  MCV 96.6 97.9  PLT 287 258    Basic Metabolic Panel: Recent Labs  Lab 12/19/21 0027 12/20/21 0042  NA 137 139  K 3.3* 3.7  CL 103 103  CO2 27 27  GLUCOSE 124* 131*  BUN 10 6  CREATININE 0.87 0.68  CALCIUM 8.7* 8.6*    GFR: Estimated Creatinine Clearance: 128 mL/min (by C-G formula based on SCr of 0.68 mg/dL).  Liver Function Tests: Recent Labs  Lab 12/19/21 0027 12/20/21 0042  AST 106* 50*  ALT 127* 87*  ALKPHOS 64 59  BILITOT 0.7 0.8  PROT 6.9 6.3*  ALBUMIN 3.6 3.1*   No results for input(s): "LIPASE", "AMYLASE" in the last 168 hours. No results for input(s): "AMMONIA" in the last 168  hours.  Coagulation Profile: No results for input(s): "INR", "PROTIME" in the last 168 hours.  Cardiac Enzymes: No results for input(s): "CKTOTAL", "CKMB", "CKMBINDEX", "TROPONINI" in the last 168 hours.  BNP (last 3 results) No results for input(s): "PROBNP" in the last 8760 hours.  Lipid Profile: No results for input(s): "CHOL", "HDL", "LDLCALC", "TRIG", "CHOLHDL", "LDLDIRECT" in the last 72 hours.  Thyroid Function Tests: No results for input(s): "TSH", "T4TOTAL", "FREET4", "T3FREE", "THYROIDAB" in the last 72 hours.  Anemia Panel: No results for input(s): "VITAMINB12", "FOLATE", "FERRITIN", "TIBC", "IRON", "RETICCTPCT"  in the last 72 hours.  Urine analysis:    Component Value Date/Time   COLORURINE YELLOW 09/23/2014 0050   APPEARANCEUR CLEAR 09/23/2014 0050   LABSPEC 1.025 09/23/2014 0050   PHURINE 5.0 09/23/2014 0050   GLUCOSEU NEGATIVE 09/23/2014 0050   HGBUR TRACE (A) 09/23/2014 0050   BILIRUBINUR NEGATIVE 09/23/2014 0050   KETONESUR NEGATIVE 09/23/2014 0050   PROTEINUR TRACE (A) 09/23/2014 0050   UROBILINOGEN 0.2 09/23/2014 0050   NITRITE NEGATIVE 09/23/2014 0050   LEUKOCYTESUR NEGATIVE 09/23/2014 0050    Sepsis Labs: Lactic Acid, Venous    Component Value Date/Time   LATICACIDVEN 0.9 12/19/2021 1650    MICROBIOLOGY: Recent Results (from the past 240 hour(s))  Surgical pcr screen     Status: Abnormal   Collection Time: 12/19/21  6:15 PM   Specimen: Nasal Mucosa; Nasal Swab  Result Value Ref Range Status   MRSA, PCR POSITIVE (A) NEGATIVE Final    Comment: RESULT CALLED TO, READ BACK BY AND VERIFIED WITH:  EMAILED Arvilla Meres @2025  FH    Staphylococcus aureus POSITIVE (A) NEGATIVE Final    Comment: (NOTE) The Xpert SA Assay (FDA approved for NASAL specimens in patients 66 years of age and older), is one component of a comprehensive surveillance program. It is not intended to diagnose infection nor to guide or monitor treatment. Performed at [facility], [address] [place], [place]   Aerobic/Anaerobic Culture w Gram Stain (surgical/deep wound)     Status: None (Preliminary result)   Collection Time: 12/19/21  6:53 PM   Specimen: Finger, Right; Wound  Result Value Ref Range Status   Specimen Description WOUND RIGHT FINGER  Final   Special Requests RT RING FINGER  Final   Gram Stain   Final    NO SQUAMOUS EPITHELIAL CELLS SEEN FEW WBC SEEN FEW GRAM POSITIVE COCCI    Culture   Final    CULTURE REINCUBATED FOR BETTER GROWTH Performed at [facility], [address], [place], [place]    Report Status PENDING  Incomplete    RADIOLOGY STUDIES/RESULTS: DG Hand Complete Right  Result Date: 12/19/2021 CLINICAL DATA:  Hand pain, no known injury. EXAM: RIGHT HAND - COMPLETE 3+ VIEW COMPARISON:  None Available. FINDINGS: There is no evidence of fracture or dislocation. There is no evidence of arthropathy or other focal bone abnormality. Soft tissues are unremarkable. IMPRESSION: Negative. Electronically Signed   By: 02/19/2022 M.D.   On: 12/19/2021 00:48     LOS: 0 days   02/19/2022, MD  [facility]    To contact the attending provider between 7A-7P or the covering provider during after hours 7P-7A, please log into the web site [email or web address] and access using universal [place] password for that web site. If you do not have the password, please call the hospital operator.  12/20/2021, 11:18 AM

## 2021-12-21 DIAGNOSIS — K1379 Other lesions of oral mucosa: Secondary | ICD-10-CM

## 2021-12-21 DIAGNOSIS — H6001 Abscess of right external ear: Secondary | ICD-10-CM

## 2021-12-21 LAB — CBC
HCT: 38.5 % — ABNORMAL LOW (ref 39.0–52.0)
Hemoglobin: 12.8 g/dL — ABNORMAL LOW (ref 13.0–17.0)
MCH: 33.3 pg (ref 26.0–34.0)
MCHC: 33.2 g/dL (ref 30.0–36.0)
MCV: 100.3 fL — ABNORMAL HIGH (ref 80.0–100.0)
Platelets: 301 10*3/uL (ref 150–400)
RBC: 3.84 MIL/uL — ABNORMAL LOW (ref 4.22–5.81)
RDW: 12.5 % (ref 11.5–15.5)
WBC: 9.7 10*3/uL (ref 4.0–10.5)
nRBC: 0 % (ref 0.0–0.2)

## 2021-12-21 MED ORDER — LACTATED RINGERS IV SOLN: INTRAVENOUS | Status: DC

## 2021-12-21 NOTE — Consult Note (Signed)
Reason for Consult:ear lesion Referring Physician: Dr Toy Baker Eric Woods is an 46 y.o. male.  HPI: Patient with a history of drug abuse and now has a right hand infection.  He is concerned about an area on his right ear also has had some drainage of purulence over time.  He is not really sure how long this area has been present.  He also has a swelling inside his mouth that drains occasionally to the right lower lip.  These are relatively small and have not progressed like his hand has.  He is due to get a incision and drainage and treatment for the hand soon.  He is on antibiotics.  Asked to evaluate the buccal and ear lesion.  He does have tenderness in both areas the ear is worse.  History reviewed. No pertinent past medical history.  Past Surgical History:  Procedure Laterality Date   ABDOMINAL SURGERY     I & D EXTREMITY Right 12/19/2021   Procedure: IRRIGATION AND DEBRIDEMENT EXTREMITY;  Surgeon: Gomez Cleverly, MD;  Location: [facility] OR;  Service: Orthopedics;  Laterality: Right;    History reviewed. No pertinent family history.  Social History:  reports that he has been smoking cigarettes. He does not have any smokeless tobacco history on file. He reports current alcohol use. He reports current drug use. Drug: Marijuana.  Allergies:  Allergies  Allergen Reactions   Penicillins Other (See Comments)    Childhood allergy    Medications: I have reviewed the patient's current medications.  Results for orders placed or performed during the hospital encounter of 12/18/21 (from the past 48 hour(s))  HIV Antibody (routine testing w rflx)     Status: None   Collection Time: 12/19/21  4:50 PM  Result Value Ref Range   HIV Screen 4th Generation wRfx Non Reactive Non Reactive    Comment: Performed at [facility], [address], [place], [place] [address]  Hepatitis panel, acute     Status: None   Collection Time: 12/19/21  4:50 PM  Result Value Ref Range   Hepatitis B Surface  Ag NON REACTIVE NON REACTIVE   HCV Ab NON REACTIVE NON REACTIVE    Comment: (NOTE) Nonreactive HCV antibody screen is consistent with no HCV infections,  unless recent infection is suspected or other evidence exists to indicate HCV infection.     Hep A IgM NON REACTIVE NON REACTIVE   Hep B C IgM NON REACTIVE NON REACTIVE    Comment: Performed at [facility], [address] [place], [place] [address]  Sedimentation rate     Status: Abnormal   Collection Time: 12/19/21  4:50 PM  Result Value Ref Range   Sed Rate 35 (H) 0 - 16 mm/hr    Comment: Performed at [facility], [address] [place], [place] [address]  C-reactive protein     Status: Abnormal   Collection Time: 12/19/21  4:50 PM  Result Value Ref Range   CRP 8.3 (H) <1.0 mg/dL    Comment: Performed at [facility], [address] [place], [place] [address]  Lactic acid, plasma     Status: None   Collection Time: 12/19/21  4:50 PM  Result Value Ref Range   Lactic Acid, Venous 0.9 0.5 - 1.9 mmol/L    Comment: Performed at [facility], [address], [place], [place] [address]  Surgical pcr screen     Status: Abnormal   Collection Time:  12/19/21  6:15 PM   Specimen: Nasal Mucosa; Nasal Swab  Result Value Ref Range   MRSA, PCR POSITIVE (A) NEGATIVE    Comment: RESULT CALLED TO, READ BACK BY AND VERIFIED WITH:  EMAILED Rush Landmark [phone] @2025  FH RN [phone] @1431  FH    Staphylococcus aureus POSITIVE (A) NEGATIVE    Comment: (NOTE) The Xpert SA Assay (FDA approved for NASAL specimens in patients 21 years of age and older), is one component of a comprehensive surveillance program. It is not intended to diagnose infection nor to guide or monitor treatment. Performed at [facility], [address] [place], [address] [place]   Aerobic/Anaerobic Culture w Gram Stain (surgical/deep wound)     Status: None (Preliminary result)   Collection Time: 12/19/21  6:53 PM   Specimen: Finger,  Right; Wound  Result Value Ref Range   Specimen Description WOUND RIGHT FINGER    Special Requests RT RING FINGER    Gram Stain      NO SQUAMOUS EPITHELIAL CELLS SEEN FEW WBC SEEN FEW GRAM POSITIVE COCCI    Culture      CULTURE REINCUBATED FOR BETTER GROWTH Performed at [facility], [address], [place], [address] [place]    Report Status PENDING   CBC     Status: Abnormal   Collection Time: 12/20/21 12:42 AM  Result Value Ref Range   WBC 12.6 (H) 4.0 - 10.5 K/uL   RBC 3.87 (L) 4.22 - 5.81 MIL/uL   Hemoglobin 13.1 13.0 - 17.0 g/dL   HCT 417[address] (L) 12/22/21 - 81.4 %   MCV 97.9 80.0 - 100.0 fL   MCH 33.9 26.0 - 34.0 pg   MCHC 34.6 30.0 - 36.0 g/dL   RDW 48.1 85.6 - 31.4 %   Platelets 258 150 - 400 K/uL   nRBC 0.0 0.0 - 0.2 %    Comment: Performed at [facility], [address], [place], [address] [place]  Comprehensive metabolic panel     Status: Abnormal   Collection Time: 12/20/21 12:42 AM  Result Value Ref Range   Sodium 139 135 - 145 mmol/L   Potassium 3.7 3.5 - 5.1 mmol/L   Chloride 103 98 - 111 mmol/L   CO2 27 22 - 32 mmol/L   Glucose, Bld 131 (H) 70 - 99 mg/dL    Comment: Glucose reference range applies only to samples taken after fasting for at least 8 hours.   BUN 6 6 - 20 mg/dL   Creatinine, Ser 78588 0.61 - 1.24 mg/dL   Calcium 8.6 (L) 8.9 - 10.3 mg/dL   Total Protein 6.3 (L) 6.5 - 8.1 g/dL   Albumin 3.1 (L) 3.5 - 5.0 g/dL   AST 50 (H) 15 - 41 U/L   ALT 87 (H) 0 - 44 U/L   Alkaline Phosphatase 59 38 - 126 U/L   Total Bilirubin 0.8 0.3 - 1.2 mg/dL   GFR, Estimated 12/22/21 5.02 mL/min    Comment: (NOTE) Calculated using the CKD-EPI Creatinine Equation (2021)    Anion gap 9 5 - 15    Comment: Performed at [facility], [address], [place], [address] [place]  CBC     Status: Abnormal   Collection Time: 12/21/21  1:46 AM  Result Value Ref Range   WBC 9.7 4.0 - 10.5 K/uL   RBC 3.84 (L) 4.22 - 5.81 MIL/uL   Hemoglobin 12.8 (L) 13.0 - 17.0 g/dL  HCT 38.5 (L) 39.0 - 52.0 %   MCV 100.3 (H) 80.0 - 100.0 fL   MCH 33.3 26.0 - 34.0 pg   MCHC 33.2 30.0 - 36.0 g/dL   RDW 64.3 32.9 - 51.8 %   Platelets 301 150 - 400 K/uL   nRBC 0.0 0.0 - 0.2 %    Comment: Performed at [facility], [address], [place], [place] [address]    No results found.  ROS Blood pressure 132/84, pulse 64, temperature 98.6 F (37 C), temperature source Oral, resp. rate 18, height 6' (1.829 m), weight 93 kg, SpO2 97 %. Physical Exam Constitutional:      Comments: He has multiple scabbed lesions all over his face.  The right ear has a purulent appearing area along his helix that has a anterior and a posterior crusted spot site.  There is some mild fluctuance.  There is multiple other areas that are not swollen like this 1 that appear to be either previous abscesses or developing.  HENT:     Nose: Nose normal.     Mouth/Throat:     Mouth: Mucous membranes are moist.     Comments: There is a fullness that is about 1 cm in his right buccal mucosa.  There is crusting along the right lower lip region which appears to be possible some previous serous drainage.  There does not palpate any purulence of the right buccal lesion.  There is no erythema of significance.  There is no erythema of the skin of the outside of the face in this location. Eyes:     Pupils: Pupils are equal, round, and reactive to light.  Musculoskeletal:     Cervical back: Normal range of motion.  Neurological:     Mental Status: He is alert.       Assessment/Plan- Right ear abscess-we discussed the options and he would like to have it drained.  We discussed doing it just with removing the crusted areas and he agreed to that with no anesthesia.  We discussed the risk, benefits, and options.  All his questions were answered and consent was obtained.  The right ear did have 2 crusted spots and after cleaning with alcohol a 18-gauge needle was used to open both the anterior and posterior  crusted spot.  Once this was removed the ear easily squeezed with purulence at the anterior site.  Seem to fully decompress.  Had some bleeding that controlled easily.  He tolerated this well.  The mouth lesion currently does not seem like it is an abscess that needs incision and drainage and will let the antibiotics work for this and see how he does.  Call if anything worsens.  These lesions are most likely MRSA so should be treated as such.   Suzanna Obey 12/21/2021, 10:14 AM

## 2021-12-21 NOTE — Progress Notes (Signed)
PROGRESS NOTE        PATIENT DETAILS Name: Eric Woods Age: 46 y.o. Sex: male Date of Birth: [birth year] Admit Date: 12/18/2021 Admitting Physician Clydie Braun, MD RWE:[id number], No Pcp Per  Brief Summary: Patient is a 46 y.o.  male with history of methamphetamine use-presented with right middle finger infection.   Significant events: 7/11>> admit to [facility] for right middle finger infection.  Significant studies: 7/12>> x-ray right hand: No fracture/dislocation.  Significant microbiology data: 7/12>> right finger wound/intraoperative cultures: Staph aureus-sensitivities pending 7/12>> acute hepatitis serology: Negative 7/12>> HIV: Nonreactive  Procedures: 7/12>>Right middle finger incision and drainage of flexor tendon sheath 7/14>> incision and drainage of right ear abscess.  Consults: Hand surgery. ENT  Subjective: Continues to complain of worsening right ear pain/swelling in the posterior auricular area.  Objective: Vitals: Blood pressure (!) 129/94, pulse 69, temperature 98.4 F (36.9 C), temperature source Oral, resp. rate 18, height 6' (1.829 m), weight 93 kg, SpO2 99 %.   Exam: Gen Exam:Alert awake-not in any distress HEENT:atraumatic, normocephalic.  More fluctuation in the nodular area in the posterior right ear area. Chest: B/L clear to auscultation anteriorly CVS:S1S2 regular Abdomen:soft non tender, non distended Extremities:no edema Neurology: Non focal Skin: no rash   Pertinent Labs/Radiology:    Latest Ref Rng & Units 12/21/2021    1:46 AM 12/20/2021   12:42 AM 12/19/2021   12:27 AM  CBC  WBC 4.0 - 10.5 K/uL 9.7  12.6  13.7   Hemoglobin 13.0 - 17.0 g/dL 67.6  19.5  09.3   Hematocrit 39.0 - 52.0 % 38.5  37.9  36.6   Platelets 150 - 400 K/uL 301  258  287     Lab Results  Component Value Date   NA 139 12/20/2021   K 3.7 12/20/2021   CL 103 12/20/2021   CO2 27 12/20/2021      Assessment/Plan: Right middle  finger infection: S/p I&D on 7/12-intraoperative cultures positive for staph-awaiting sensitivities-stop Rocephin-continue vancomycin.  Hand surgery following-to start on hydrotherapy today.   Right ear abscess: Worsening-ENT consulted and I&D performed.   Already on empiric antibiotics.    Small pustular area in the right oral cavity: Unclear if this is an abscess-but already on antibiotics-ENT does not think that this needs I&D at this point.  Follow.  Transaminitis: Probably due to drug use-thankfully downtrending.  Acute hepatitis serology negative.  Continue to trend periodically.  Methamphetamine use: Denies IV use-only snorts.  Have counseled.  BMI: Estimated body mass index is 27.8 kg/m as calculated from the following:   Height as of this encounter: 6' (1.829 m).   Weight as of this encounter: 93 kg.   Code status:   Code Status: Full Code   DVT Prophylaxis: enoxaparin (LOVENOX) injection 40 mg Start: 12/19/21 1630   Family Communication: None at bedside   Disposition Plan: Status is: Inpatient Remains inpatient appropriate because: Right middle finger infection-not yet stable for discharge-hydrotherapy planned from 7/14-on IV antibiotics.   Planned Discharge Destination:Home likely over the next few days.   Diet: Diet Order             Diet regular Room service appropriate? Yes; Fluid consistency: Thin  Diet effective now                     Antimicrobial agents: Anti-infectives (From  admission, onward)    Start     Dose/Rate Route Frequency Ordered Stop   12/20/21 0600  ceFAZolin (ANCEF) IVPB 2g/100 mL premix  Status:  Discontinued        2 g 200 mL/hr over 30 Minutes Intravenous On call to O.R. 12/19/21 1619 12/19/21 2034   12/19/21 2100  vancomycin (VANCOREADY) IVPB 1500 mg/300 mL        1,500 mg 150 mL/hr over 120 Minutes Intravenous Every 12 hours 12/19/21 0734     12/19/21 0900  vancomycin (VANCOCIN) 500 mg in sodium chloride 0.9 % 100 mL IVPB   Status:  Discontinued       See Hyperspace for full Linked Orders Report.   500 mg 100 mL/hr over 60 Minutes Intravenous  Once 12/19/21 0734 12/20/21 1336   12/19/21 0800  cefTRIAXone (ROCEPHIN) 2 g in sodium chloride 0.9 % 100 mL IVPB  Status:  Discontinued        2 g 200 mL/hr over 30 Minutes Intravenous Every 24 hours 12/19/21 0729 12/21/21 1312   12/19/21 0800  vancomycin (VANCOCIN) IVPB 1000 mg/200 mL premix       See Hyperspace for full Linked Orders Report.   1,000 mg 200 mL/hr over 60 Minutes Intravenous  Once 12/19/21 0734 12/19/21 1025   12/19/21 0600  ceFAZolin (ANCEF) IVPB 1 g/50 mL premix        1 g 100 mL/hr over 30 Minutes Intravenous  Once 12/19/21 0547 12/19/21 0637   12/19/21 0115  vancomycin (VANCOCIN) IVPB 1000 mg/200 mL premix        1,000 mg 200 mL/hr over 60 Minutes Intravenous  Once 12/19/21 0111 12/19/21 0246        MEDICATIONS: Scheduled Meds:  Chlorhexidine Gluconate Cloth  6 each Topical Q0600   enoxaparin (LOVENOX) injection  40 mg Subcutaneous Q24H   mupirocin ointment  1 Application Nasal BID   sodium chloride flush  3 mL Intravenous Q12H   Continuous Infusions:  vancomycin 1,500 mg (12/21/21 0912)   PRN Meds:.acetaminophen **OR** acetaminophen, albuterol, hydrALAZINE, ondansetron **OR** ondansetron (ZOFRAN) IV, oxyCODONE   I have personally reviewed following labs and imaging studies  LABORATORY DATA: CBC: Recent Labs  Lab 12/19/21 0027 12/20/21 0042 12/21/21 0146  WBC 13.7* 12.6* 9.7  NEUTROABS 11.3*  --   --   HGB 12.8* 13.1 12.8*  HCT 36.6* 37.9* 38.5*  MCV 96.6 97.9 100.3*  PLT 287 258 301     Basic Metabolic Panel: Recent Labs  Lab 12/19/21 0027 12/20/21 0042  NA 137 139  K 3.3* 3.7  CL 103 103  CO2 27 27  GLUCOSE 124* 131*  BUN 10 6  CREATININE 0.87 0.68  CALCIUM 8.7* 8.6*     GFR: Estimated Creatinine Clearance: 128 mL/min (by C-G formula based on SCr of 0.68 mg/dL).  Liver Function Tests: Recent Labs   Lab 12/19/21 0027 12/20/21 0042  AST 106* 50*  ALT 127* 87*  ALKPHOS 64 59  BILITOT 0.7 0.8  PROT 6.9 6.3*  ALBUMIN 3.6 3.1*    No results for input(s): "LIPASE", "AMYLASE" in the last 168 hours. No results for input(s): "AMMONIA" in the last 168 hours.  Coagulation Profile: No results for input(s): "INR", "PROTIME" in the last 168 hours.  Cardiac Enzymes: No results for input(s): "CKTOTAL", "CKMB", "CKMBINDEX", "TROPONINI" in the last 168 hours.  BNP (last 3 results) No results for input(s): "PROBNP" in the last 8760 hours.  Lipid Profile: No results for input(s): "CHOL", "HDL", "LDLCALC", "  TRIG", "CHOLHDL", "LDLDIRECT" in the last 72 hours.  Thyroid Function Tests: No results for input(s): "TSH", "T4TOTAL", "FREET4", "T3FREE", "THYROIDAB" in the last 72 hours.  Anemia Panel: No results for input(s): "VITAMINB12", "FOLATE", "FERRITIN", "TIBC", "IRON", "RETICCTPCT" in the last 72 hours.  Urine analysis:    Component Value Date/Time   COLORURINE YELLOW 09/23/2014 0050   APPEARANCEUR CLEAR 09/23/2014 0050   LABSPEC 1.025 09/23/2014 0050   PHURINE 5.0 09/23/2014 0050   GLUCOSEU NEGATIVE 09/23/2014 0050   HGBUR TRACE (A) 09/23/2014 0050   BILIRUBINUR NEGATIVE 09/23/2014 0050   KETONESUR NEGATIVE 09/23/2014 0050   PROTEINUR TRACE (A) 09/23/2014 0050   UROBILINOGEN 0.2 09/23/2014 0050   NITRITE NEGATIVE 09/23/2014 0050   LEUKOCYTESUR NEGATIVE 09/23/2014 0050    Sepsis Labs: Lactic Acid, Venous    Component Value Date/Time   LATICACIDVEN 0.9 12/19/2021 1650    MICROBIOLOGY: Recent Results (from the past 240 hour(s))  Surgical pcr screen     Status: Abnormal   Collection Time: 12/19/21  6:15 PM   Specimen: Nasal Mucosa; Nasal Swab  Result Value Ref Range Status   MRSA, PCR POSITIVE (A) NEGATIVE Final    Comment: RESULT CALLED TO, READ BACK BY AND VERIFIED WITH:  EMAILED Rush Landmark [phone] @2025  FH RN [phone] @1431  FH    Staphylococcus aureus POSITIVE  (A) NEGATIVE Final    Comment: (NOTE) The Xpert SA Assay (FDA approved for NASAL specimens in patients 26 years of age and older), is one component of a comprehensive surveillance program. It is not intended to diagnose infection nor to guide or monitor treatment. Performed at [facility], [address] [place], [address] [place]   Aerobic/Anaerobic Culture w Gram Stain (surgical/deep wound)     Status: None (Preliminary result)   Collection Time: 12/19/21  6:53 PM   Specimen: Finger, Right; Wound  Result Value Ref Range Status   Specimen Description WOUND RIGHT FINGER  Final   Special Requests RT RING FINGER  Final   Gram Stain   Final    NO SQUAMOUS EPITHELIAL CELLS SEEN FEW WBC SEEN FEW GRAM POSITIVE COCCI    Culture   Final    MODERATE STAPHYLOCOCCUS AUREUS SUSCEPTIBILITIES TO FOLLOW Performed at [facility], [address], [place], [address] [place]    Report Status PENDING  Incomplete    RADIOLOGY STUDIES/RESULTS: No results found.   LOS: 1 day   [place], MD  [facility]    To contact the attending provider between 7A-7P or the covering provider during after hours 7P-7A, please log into the web site [email or web address] and access using universal [place] password for that web site. If you do not have the password, please call the hospital operator.  12/21/2021, 1:48 PM

## 2021-12-21 NOTE — Progress Notes (Signed)
Physical Therapy Wound Treatment Patient Details  Name: Eric Woods MRN: [id number] Date of Birth: [birth year]  Today's Date: 12/21/2021 Time: 1440-1510 Time Calculation (min): 30 min  Subjective  Subjective Assessment Subjective: Pt pleasant and agreeable to hydrotherapy. Patient and Family Stated Goals: Heal wound, no other wounds "pop up" (multiple sites on hands/face/ears he was referring to) Date of Onset: 12/18/21 (Approximately) Prior Treatments: I&D per Dr. Greta Doom 12/19/21  Pain Score:  Pt premedicated and tolerated treatment well with minimal complaints of pain.   Wound Assessment  Wound / Incision (Open or Dehisced) 12/21/21 Incision - Open Finger (Comment which one) Anterior;Right Middle finger (Active)  Wound Image   12/21/21 1517  Dressing Type Non adherent;Gauze (Comment);Normal saline moist dressing;Moist to moist 12/21/21 1517  Dressing Changed Changed 12/21/21 1517  Dressing Status Clean, Dry, Intact 12/21/21 1517  Dressing Change Frequency Daily 12/21/21 1517  % Wound base Red or Granulating 90% 12/21/21 1517  % Wound base Yellow/Fibrinous Exudate 5% 12/21/21 1517  % Wound base Black/Eschar 5% 12/21/21 1517  % Wound base Other/Granulation Tissue (Comment) 0% 12/21/21 1517  Peri-wound Assessment Intact;Maceration;Purple 12/21/21 1517  Wound Length (cm) 7 cm 12/21/21 1517  Wound Width (cm) 0.5 cm 12/21/21 1517  Wound Depth (cm) 0.1 cm 12/21/21 1517  Wound Volume (cm^3) 0.35 cm^3 12/21/21 1517  Wound Surface Area (cm^2) 3.5 cm^2 12/21/21 1517  Tunneling (cm) 0 12/21/21 1517  Undermining (cm) 0 12/21/21 1517  Margins Unattached edges (unapproximated) 12/21/21 1517  Closure None 12/21/21 1517  Drainage Amount Minimal 12/21/21 1517  Drainage Description Sanguineous 12/21/21 1517  Treatment Hydrotherapy (Pulse lavage);Packing (Saline gauze) 12/21/21 1517      Hydrotherapy Pulsed lavage therapy - wound location: R middle finger Pulsed Lavage with Suction (psi):  4 psi (to 8 depending on pain tolerance) Pulsed Lavage with Suction - Normal Saline Used: 1000 mL Pulsed Lavage Tip: Tip with splash shield    Wound Assessment and Plan  Wound Therapy - Assess/Plan/Recommendations Wound Therapy - Clinical Statement: Pt presents to hydrotherapy s/p I&D on 12/19/2021. Wound is mostly clean, with minimal yellow tissue at the distal end of the incision, and small back spot in the middle of the incision. Mother present and engaged throughout session and will be performing dressing changes for the pt upon d/c. This patient will benefit from continued hydrotherapy to decrease bioburden and promote wound bed healing, as well as for pt/family education for wound care at d/c. Wound Therapy - Functional Problem List: Decreased strength/AROM mostly of R middle finger, but also in other fingers on R hand and R wrist. Factors Delaying/Impairing Wound Healing: Infection - systemic/local, Substance abuse Hydrotherapy Plan: Dressing change, Patient/family education, Pulsatile lavage with suction Wound Therapy - Frequency: 3X / week Wound Therapy - Follow Up Recommendations: dressing changes by family/patient  Wound Therapy Goals- Improve the function of patient's integumentary system by progressing the wound(s) through the phases of wound healing (inflammation - proliferation - remodeling) by: Wound Therapy Goals - Improve the function of patient's integumentary system by progressing the wound(s) through the phases of wound healing by: Decrease Necrotic Tissue to: 0 Decrease Necrotic Tissue - Progress: Goal set today Increase Granulation Tissue to: 100 Increase Granulation Tissue - Progress: Goal set today Patient/Family will be able to : perform full dressing change with supervision and <25% cues. Patient/Family Instruction Goal - Progress: Goal set today Goals/treatment plan/discharge plan were made with and agreed upon by patient/family: Yes Time For Goal Achievement: 7  days Wound Therapy - Potential for  Goals: Excellent  Goals will be updated until maximal potential achieved or discharge criteria met.  Discharge criteria: when goals achieved, discharge from hospital, MD decision/surgical intervention, no progress towards goals, refusal/missing three consecutive treatments without notification or medical reason.  GP     Charges PT Wound Care Charges $Wound Debridement up to 20 cm: < or equal to 20 cm $PT PLS Gun and Tip: 1 Supply $PT Hydrotherapy Visit: 1 Visit       Thelma Comp 12/21/2021, 3:35 PM  Rolinda Roan, PT, DPT Acute Rehabilitation Services Secure Chat Preferred Office: [phone]

## 2021-12-21 NOTE — Progress Notes (Signed)
Postop day 1 status post right middle finger I&D for flexor tendon sheath infection.  The patient is doing well his pain is controlled he is not having any subjective fevers or chills.  We discussed drug use and he would like to stop using drugs and has a good plan to get off the drugs when he gets home.  He feels as though this is a wake-up call for him to stop using.  Bandage was changed to the right middle finger and  reveals a well-healing volar incision.  Erythema and drainage has resolved.  Stiffness of the digit as expected.  Sensation intact to light touch and brisk cap refill to the tip of the middle finger.  Patient is doing well postop day 1 plan for twice daily soaks and dressing changes with warm soapy water and chlorhexidine if available.  15-minute soaks at a time twice a day followed by a clean bandage with nonstick and rolled gauz e.  I instructed him on finger range of motion to avoid stiffness to the digits.  I have also emphasized elevation.  Continue IV antibiotics per medicine team.  He will follow-up with me in approximately 10 days in my office.  My staff will call to set up an appointment with him postoperatively.

## 2021-12-22 LAB — COMPREHENSIVE METABOLIC PANEL
ALT: 60 U/L — ABNORMAL HIGH (ref 0–44)
AST: 41 U/L (ref 15–41)
Albumin: 2.7 g/dL — ABNORMAL LOW (ref 3.5–5.0)
Alkaline Phosphatase: 56 U/L (ref 38–126)
Anion gap: 5 (ref 5–15)
BUN: 11 mg/dL (ref 6–20)
CO2: 27 mmol/L (ref 22–32)
Calcium: 8.4 mg/dL — ABNORMAL LOW (ref 8.9–10.3)
Chloride: 108 mmol/L (ref 98–111)
Creatinine, Ser: 0.72 mg/dL (ref 0.61–1.24)
GFR, Estimated: >60 mL/min (ref >60)
Glucose, Bld: 97 mg/dL (ref 70–99)
Potassium: 3.7 mmol/L (ref 3.5–5.1)
Sodium: 140 mmol/L (ref 135–145)
Total Bilirubin: 0.5 mg/dL (ref 0.3–1.2)
Total Protein: 5.5 g/dL — ABNORMAL LOW (ref 6.5–8.1)

## 2021-12-22 NOTE — Progress Notes (Signed)
PROGRESS NOTE        PATIENT DETAILS Name: Eric Woods Age: 46 y.o. Sex: male Date of Birth: Sep 09, 1975 Admit Date: 12/18/2021 Admitting Physician Clydie Braun, MD OZH:[id number], No Pcp Per  Brief Summary: Patient is a 46 y.o.  male with history of methamphetamine use-presented with right middle finger infection.   Significant events: 7/11>> admit to [facility] for right middle finger infection.  Significant studies: 7/12>> x-ray right hand: No fracture/dislocation.  Significant microbiology data: 7/12>> right finger wound/intraoperative cultures: Staph aureus-sensitivities pending 7/12>> acute hepatitis serology: Negative 7/12>> HIV: Nonreactive  Procedures: 7/12>>Right middle finger incision and drainage of flexor tendon sheath 7/14>> incision and drainage of right ear abscess.  Consults: Hand surgery. ENT  Subjective:  Patient in bed, appears comfortable, denies any headache, no fever, no chest pain or pressure, no shortness of breath , no abdominal pain. No focal weakness.  Objective: Vitals: Blood pressure 134/86, pulse 75, temperature 98.8 F (37.1 C), temperature source Oral, resp. rate 15, height 6' (1.829 m), weight 93 kg, SpO2 97 %.   Exam:  Awake Alert, No new F.N deficits, right earlobe I&D site stable, right middle finger under bandage . [place].AT,PERRAL Supple Neck, No JVD,   Symmetrical Chest wall movement, Good air movement bilaterally, CTAB RRR,No Gallops, Rubs or new Murmurs,  +ve B.Sounds, Abd Soft, No tenderness,   No Cyanosis, Clubbing or edema     Assessment/Plan:  Right middle finger infection: S/p I&D on 7/12-intraoperative cultures positive for staph-awaiting sensitivities-stop Rocephin-continue vancomycin.  Hand surgery following-to start on hydrotherapy today.   Plan per surgeon - for twice daily soaks and dressing changes with warm soapy water and chlorhexidine if available.  15-minute soaks at a time twice a day  followed by a clean bandage with nonstick and rolled gauze   Right ear abscess: Worsening-ENT consulted and I&D performed.   Already on empiric antibiotics.    Small pustular area in the right oral cavity: Unclear if this is an abscess-but already on antibiotics-ENT does not think that this needs I&D at this point.  Follow.  Transaminitis: Probably due to drug use-thankfully downtrending.  Acute hepatitis serology negative.  Continue to trend periodically.  Methamphetamine use: Denies IV use-only snorts.  Have counseled.  BMI: Estimated body mass index is 27.8 kg/m as calculated from the following:   Height as of this encounter: 6' (1.829 m).   Weight as of this encounter: 93 kg.   Code status:   Code Status: Full Code   DVT Prophylaxis: enoxaparin (LOVENOX) injection 40 mg Start: 12/19/21 1630   Family Communication: None at bedside   Disposition Plan: Status is: Inpatient Remains inpatient appropriate because: Right middle finger infection-not yet stable for discharge-hydrotherapy planned from 7/14-on IV antibiotics.   Planned Discharge Destination:Home likely over the next few days.   Diet: Diet Order             Diet regular Room service appropriate? Yes; Fluid consistency: Thin  Diet effective now                   MEDICATIONS: Scheduled Meds:  Chlorhexidine Gluconate Cloth  6 each Topical Q0600   enoxaparin (LOVENOX) injection  40 mg Subcutaneous Q24H   mupirocin ointment  1 Application Nasal BID   sodium chloride flush  3 mL Intravenous Q12H   Continuous Infusions:  lactated ringers  50 mL/hr at 12/22/21 0118   vancomycin 1,500 mg (12/22/21 0857)   PRN Meds:.acetaminophen **OR** acetaminophen, albuterol, hydrALAZINE, ondansetron **OR** ondansetron (ZOFRAN) IV, oxyCODONE   I have personally reviewed following labs and imaging studies  LABORATORY DATA:  Recent Labs  Lab 12/19/21 0027 12/20/21 0042 12/21/21 0146  WBC 13.7* 12.6* 9.7  HGB 12.8*  13.1 12.8*  HCT 36.6* 37.9* 38.5*  PLT 287 258 301  MCV 96.6 97.9 100.3*  MCH 33.8 33.9 33.3  MCHC 35.0 34.6 33.2  RDW 12.4 12.5 12.5  LYMPHSABS 0.9  --   --   MONOABS 1.0  --   --   EOSABS 0.3  --   --   BASOSABS 0.1  --   --     Recent Labs  Lab 12/19/21 0027 12/19/21 1650 12/20/21 0042 12/22/21 0053  NA 137  --  139 140  K 3.3*  --  3.7 3.7  CL 103  --  103 108  CO2 27  --  27 27  GLUCOSE 124*  --  131* 97  BUN 10  --  6 11  CREATININE 0.87  --  0.68 0.72  CALCIUM 8.7*  --  8.6* 8.4*  AST 106*  --  50* 41  ALT 127*  --  87* 60*  ALKPHOS 64  --  59 56  BILITOT 0.7  --  0.8 0.5  ALBUMIN 3.6  --  3.1* 2.7*  CRP  --  8.3*  --   --   LATICACIDVEN  --  0.9  --   --       LOS: 2 days   Signature  Susa Raring M.D on 12/22/2021 at 12:34 PM   -  To page go to [email or web address]

## 2021-12-22 NOTE — Progress Notes (Addendum)
Pharmacy Antibiotic Note  Eric Woods is a 46 y.o. male admitted on 12/18/2021 with  wound infections .  S/p I&D right hand on 7/12. Pharmacy has been consulted for vancomycin dosing. Per ortho and ENT, can consider transitioning to PO with further improvement. Anticipate transitioning to oral antibiotics soon. Patient currently on  Vancomycin 1500 mg IV Q12h. Patient is clinically improving on this dose, low threshold to increase dose to higher end of AUC goal.   Plan: Vancomycin 1500 mg IV Q12h (eAUC 405, goal AUC 400-550, Scr 0.8, Vd 0.72)  Trend WBC, fever, renal function F/u cultures, clinical progress, levels as indicated De-escalate when able  Height: 6' (182.9 cm) Weight: 93 kg (205 lb) IBW/kg (Calculated) : 77.6  Temp (24hrs), Avg:98.4 F (36.9 C), Min:98 F (36.7 C), Max:98.8 F (37.1 C)  Recent Labs  Lab 12/19/21 0027 12/19/21 1650 12/20/21 0042 12/21/21 0146 12/22/21 0053  WBC 13.7*  --  12.6* 9.7  --   CREATININE 0.87  --  0.68  --  0.72  LATICACIDVEN  --  0.9  --   --   --     Estimated Creatinine Clearance: 128 mL/min (by C-G formula based on SCr of 0.72 mg/dL).    Allergies  Allergen Reactions   Penicillins Other (See Comments)    Childhood allergy    Antimicrobials this admission: Vanc 7/12 >> CTX 7/12 >> Cefazolin x 1 7/12  Microbiology results: 7/12 Wound Cx: moderate staphylococcus aureus 7/12 MRSA PCR: positive  Thank you for allowing pharmacy to be a part of this patient's care.  Georga Hacking 12/22/2021 1:40 PM

## 2021-12-22 NOTE — Progress Notes (Signed)
Physical Therapy Wound Treatment Patient Details  Name: Eric Woods MRN: [id number] Date of Birth: 1975/09/06  Today's Date: 12/22/2021 Time: 7628-3151 Time Calculation (min): 22 min  Subjective  Subjective Assessment Subjective: Pt pleasant and agreeable to hydrotherapy. Date of Onset: 12/18/21 Prior Treatments: I&D per Dr. Greta Doom 12/19/21  Pain Score: Pain Score: 4   Wound Assessment  Wound / Incision (Open or Dehisced) 12/21/21 Incision - Open Finger (Comment which one) Anterior;Right Middle finger (Active)  Wound Image   12/21/21 1517  Dressing Type Non adherent;Gauze (Comment);Normal saline moist dressing;Moist to moist 12/22/21 1007  Dressing Changed Changed 12/22/21 1007  Dressing Status Clean, Dry, Intact 12/22/21 1007  Dressing Change Frequency Twice a day 12/22/21 1007  Site / Wound Assessment Granulation tissue;Red 12/22/21 1007  % Wound base Red or Granulating 95% 12/22/21 1007  % Wound base Yellow/Fibrinous Exudate 0% 12/22/21 1007  % Wound base Black/Eschar 5% 12/22/21 1007  % Wound base Other/Granulation Tissue (Comment) 0% 12/22/21 1007  Peri-wound Assessment Intact;Maceration;Purple 12/22/21 1007  Wound Length (cm) 7 cm 12/21/21 1517  Wound Width (cm) 0.5 cm 12/21/21 1517  Wound Depth (cm) 0.1 cm 12/21/21 1517  Wound Volume (cm^3) 0.35 cm^3 12/21/21 1517  Wound Surface Area (cm^2) 3.5 cm^2 12/21/21 1517  Tunneling (cm) 0 12/21/21 1517  Undermining (cm) 0 12/21/21 1517  Margins Unattached edges (unapproximated) 12/22/21 1007  Closure None 12/22/21 1007  Drainage Amount Minimal 12/22/21 1007  Drainage Description Serosanguineous 12/22/21 1007  Treatment Hydrotherapy (Pulse lavage) 12/22/21 1007      Hydrotherapy Pulsed lavage therapy - wound location: R middle finger Pulsed Lavage with Suction (psi): 4 psi Pulsed Lavage with Suction - Normal Saline Used: 1000 mL Pulsed Lavage Tip: Tip with splash shield    Wound Assessment and Plan  Wound Therapy -  Assess/Plan/Recommendations Wound Therapy - Clinical Statement: Patient seen for hydrotherapy today.  Wound is essentially clean.  There is one speck of black in wound. Unsure if this is stitch.  As wound is clean, recommend moving towards just BID dressing changes and irrigating wound with saline or running tap water between dresssing.  I might recommend using zeroform or petroleum gauze on wound to maintain moisture but remain non-adherent.  Will discuss recommendations with MD. Wound Therapy - Functional Problem List: Decreased strength/AROM mostly of R middle finger, but also in other fingers on R hand and R wrist. Factors Delaying/Impairing Wound Healing: Infection - systemic/local, Substance abuse Hydrotherapy Plan: Dressing change, Patient/family education, Pulsatile lavage with suction Wound Therapy - Frequency: 3X / week Wound Therapy - Follow Up Recommendations: dressing changes by family/patient  Wound Therapy Goals- Improve the function of patient's integumentary system by progressing the wound(s) through the phases of wound healing (inflammation - proliferation - remodeling) by: Wound Therapy Goals - Improve the function of patient's integumentary system by progressing the wound(s) through the phases of wound healing by: Decrease Necrotic Tissue - Progress: Progressing toward goal Increase Granulation Tissue - Progress: Progressing toward goal Patient/Family Instruction Goal - Progress: Progressing toward goal  Goals will be updated until maximal potential achieved or discharge criteria met.  Discharge criteria: when goals achieved, discharge from hospital, MD decision/surgical intervention, no progress towards goals, refusal/missing three consecutive treatments without notification or medical reason.  GP     Charges PT Wound Care Charges $Wound Debridement up to 20 cm: < or equal to 20 cm $PT PLS Gun and Tip: 1 Supply $PT Hydrotherapy Visit: 1 Visit       Tatyanna Cronk, Pleas Koch  M 12/22/2021, 11:21 AM 12/22/2021 Lesleigh Noe, [place] Office:  [phone]

## 2021-12-23 NOTE — Progress Notes (Signed)
PROGRESS NOTE        PATIENT DETAILS Name: Eric Woods Age: 46 y.o. Sex: male Date of Birth: 1975/11/12 Admit Date: 12/18/2021 Admitting Physician Clydie Braun, MD NOB:[id number], No Pcp Per  Brief Summary: Patient is a 46 y.o.  male with history of methamphetamine use-presented with right middle finger infection.   Significant events: 7/11>> admit to [facility] for right middle finger infection.  Significant studies: 7/12>> x-ray right hand: No fracture/dislocation.  Significant microbiology data: 7/12>> right finger wound/intraoperative cultures: Staph aureus-sensitivities pending 7/12>> acute hepatitis serology: Negative 7/12>> HIV: Nonreactive  Procedures: 7/12>>Right middle finger incision and drainage of flexor tendon sheath 7/14>> incision and drainage of right ear abscess.  Consults: Hand surgery. ENT  Subjective:  Patient in bed, appears comfortable, denies any headache, no fever, no chest pain or pressure, no shortness of breath , no abdominal pain. No new focal weakness.   Objective: Vitals: Blood pressure 134/90, pulse 66, temperature 97.8 F (36.6 C), temperature source Oral, resp. rate 18, height 6' (1.829 m), weight 93 kg, SpO2 96 %.   Exam:  Awake Alert, No new F.N deficits, right earlobe I&D site stable, right middle finger under bandage . [place].AT,PERRAL Supple Neck, No JVD,   Symmetrical Chest wall movement, Good air movement bilaterally, CTAB RRR,No Gallops, Rubs or new Murmurs,  +ve B.Sounds, Abd Soft, No tenderness,   No Cyanosis, Clubbing or edema     Assessment/Plan:  Right middle finger infection: S/p I&D on 7/12-by hand surgeon Dr. Gomez Cleverly intraoperative cultures positive for staph-awaiting sensitivities-stop Rocephin-continue vancomycin.  Hand surgery following-to start on hydrotherapy today.   Plan per surgeon - for twice daily soaks and dressing changes with warm soapy water and chlorhexidine if available.   15-minute soaks at a time twice a day followed by a clean bandage with nonstick and rolled gauze   Right ear abscess: Worsening-ENT consulted and I&D performed by Dr Suzanna Obey.   Already on empiric antibiotics.    Small pustular area in the right oral cavity: Unclear if this is an abscess-but already on antibiotics-ENT does not think that this needs I&D at this point.  Follow.  Transaminitis: Probably due to drug use-thankfully downtrending.  Acute hepatitis serology negative.  Continue to trend periodically.  Methamphetamine use: Denies IV use-only snorts.  Have counseled.  BMI: Estimated body mass index is 27.8 kg/m as calculated from the following:   Height as of this encounter: 6' (1.829 m).   Weight as of this encounter: 93 kg.   Code status:   Code Status: Full Code   DVT Prophylaxis: enoxaparin (LOVENOX) injection 40 mg Start: 12/19/21 1630   Family Communication: None at bedside   Disposition Plan: Status is: Inpatient Remains inpatient appropriate because: Right middle finger infection-not yet stable for discharge-hydrotherapy planned from 7/14-on IV antibiotics.   Planned Discharge Destination:Home likely over the next few days.   Diet: Diet Order             Diet regular Room service appropriate? Yes; Fluid consistency: Thin  Diet effective now                   MEDICATIONS: Scheduled Meds:  Chlorhexidine Gluconate Cloth  6 each Topical Q0600   enoxaparin (LOVENOX) injection  40 mg Subcutaneous Q24H   mupirocin ointment  1 Application Nasal BID   sodium chloride flush  3 mL Intravenous Q12H   Continuous Infusions:  vancomycin 1,500 mg (12/23/21 0927)   PRN Meds:.acetaminophen **OR** acetaminophen, albuterol, hydrALAZINE, ondansetron **OR** ondansetron (ZOFRAN) IV, oxyCODONE   I have personally reviewed following labs and imaging studies  LABORATORY DATA:  Recent Labs  Lab 12/19/21 0027 12/20/21 0042 12/21/21 0146  WBC 13.7* 12.6* 9.7   HGB 12.8* 13.1 12.8*  HCT 36.6* 37.9* 38.5*  PLT 287 258 301  MCV 96.6 97.9 100.3*  MCH 33.8 33.9 33.3  MCHC 35.0 34.6 33.2  RDW 12.4 12.5 12.5  LYMPHSABS 0.9  --   --   MONOABS 1.0  --   --   EOSABS 0.3  --   --   BASOSABS 0.1  --   --     Recent Labs  Lab 12/19/21 0027 12/19/21 1650 12/20/21 0042 12/22/21 0053  NA 137  --  139 140  K 3.3*  --  3.7 3.7  CL 103  --  103 108  CO2 27  --  27 27  GLUCOSE 124*  --  131* 97  BUN 10  --  6 11  CREATININE 0.87  --  0.68 0.72  CALCIUM 8.7*  --  8.6* 8.4*  AST 106*  --  50* 41  ALT 127*  --  87* 60*  ALKPHOS 64  --  59 56  BILITOT 0.7  --  0.8 0.5  ALBUMIN 3.6  --  3.1* 2.7*  CRP  --  8.3*  --   --   LATICACIDVEN  --  0.9  --   --       LOS: 3 days   Signature  Susa Raring M.D on 12/23/2021 at 10:58 AM   -  To page go to [email or web address]

## 2021-12-24 LAB — BASIC METABOLIC PANEL
Anion gap: 6 (ref 5–15)
BUN: 8 mg/dL (ref 6–20)
CO2: 24 mmol/L (ref 22–32)
Calcium: 8.5 mg/dL — ABNORMAL LOW (ref 8.9–10.3)
Chloride: 109 mmol/L (ref 98–111)
Creatinine, Ser: 0.69 mg/dL (ref 0.61–1.24)
GFR, Estimated: >60 mL/min (ref >60)
Glucose, Bld: 111 mg/dL — ABNORMAL HIGH (ref 70–99)
Potassium: 3.5 mmol/L (ref 3.5–5.1)
Sodium: 139 mmol/L (ref 135–145)

## 2021-12-24 LAB — CBC WITH DIFFERENTIAL/PLATELET
Abs Immature Granulocytes: 0.03 10*3/uL (ref 0.00–0.07)
Basophils Absolute: 0.1 10*3/uL (ref 0.0–0.1)
Basophils Relative: 2 %
Eosinophils Absolute: 0.6 10*3/uL — ABNORMAL HIGH (ref 0.0–0.5)
Eosinophils Relative: 7 %
HCT: 38.6 % — ABNORMAL LOW (ref 39.0–52.0)
Hemoglobin: 12.8 g/dL — ABNORMAL LOW (ref 13.0–17.0)
Immature Granulocytes: 0 %
Lymphocytes Relative: 27 %
Lymphs Abs: 2.1 10*3/uL (ref 0.7–4.0)
MCH: 33 pg (ref 26.0–34.0)
MCHC: 33.2 g/dL (ref 30.0–36.0)
MCV: 99.5 fL (ref 80.0–100.0)
Monocytes Absolute: 0.7 10*3/uL (ref 0.1–1.0)
Monocytes Relative: 9 %
Neutro Abs: 4.2 10*3/uL (ref 1.7–7.7)
Neutrophils Relative %: 55 %
Platelets: 352 10*3/uL (ref 150–400)
RBC: 3.88 MIL/uL — ABNORMAL LOW (ref 4.22–5.81)
RDW: 12.2 % (ref 11.5–15.5)
WBC: 7.8 10*3/uL (ref 4.0–10.5)
nRBC: 0 % (ref 0.0–0.2)

## 2021-12-24 LAB — AEROBIC/ANAEROBIC CULTURE W GRAM STAIN (SURGICAL/DEEP WOUND): Gram Stain: NONE SEEN

## 2021-12-24 NOTE — Progress Notes (Signed)
Physical Therapy Wound Treatment & Discharge Patient Details  Name: Eric Woods MRN: [id number] Date of Birth: August 22, 1975  Today's Date: 12/24/2021 Time: 1023-1121 Time Calculation (min): 58 min  Subjective  Subjective Assessment Subjective: Pt reports he was unaware whether he had stitches still in the wound. Patient and Family Stated Goals: Heal wound, no other wounds "pop up" (multiple sites on hands/face/ears he was referring to) Date of Onset: 12/18/21 Prior Treatments: I&D per Dr. Greta Doom 12/19/21  Pain Score:  Pre-medicated with minimal reported pain  Wound Assessment  Wound / Incision (Open or Dehisced) 12/21/21 Incision - Open Finger (Comment which one) Anterior;Right Middle finger (Active)  Wound Image   12/24/21 1059  Dressing Type Non adherent;Gauze (Comment);Normal saline moist dressing;Moist to dry 12/24/21 1532  Dressing Changed Changed 12/24/21 1059  Dressing Status Clean, Dry, Intact 12/24/21 1532  Dressing Change Frequency Twice a day 12/24/21 1532  Site / Wound Assessment Granulation tissue;Red;Yellow;Black 12/24/21 1532  % Wound base Red or Granulating 90% 12/24/21 1532  % Wound base Yellow/Fibrinous Exudate 5% 12/24/21 1532  % Wound base Black/Eschar 5% 12/24/21 1532  % Wound base Other/Granulation Tissue (Comment) 0% 12/24/21 1532  Peri-wound Assessment Intact;Maceration;Purple;Pink 12/24/21 1532  Wound Length (cm) 7.8 cm 12/24/21 1532  Wound Width (cm) 0.5 cm 12/24/21 1532  Wound Depth (cm) 0.2 cm 12/24/21 1532  Wound Volume (cm^3) 0.78 cm^3 12/24/21 1532  Wound Surface Area (cm^2) 3.9 cm^2 12/24/21 1532  Tunneling (cm) 0 12/24/21 1532  Undermining (cm) 0 12/24/21 1532  Margins Unattached edges (unapproximated) 12/24/21 1532  Closure None;Sutures 12/24/21 1532  Drainage Amount Minimal 12/24/21 1532  Drainage Description Serosanguineous 12/24/21 1532  Treatment Debridement (Selective);Irrigation;Packing (Saline gauze) 12/24/21 1532   Selective  Debridement Selective Debridement - Location: R middle finger Selective Debridement - Tools Used: Forceps, Scalpel Selective Debridement - Tissue Removed: Yellow and black necrotic tissue    Wound Assessment and Plan  Wound Therapy - Assess/Plan/Recommendations Wound Therapy - Clinical Statement: At end of hydrotherapy and prior to re-applying pt's dressings, provided pt's 15 min recommended soak in warm soapy water and chlorhexidine. Pt's wound appeared to have a little yellow slough/necrotic tissue at the distal aspect of the wound, which was primarily removed today with selective debridement. Noted x3 potential stitches in the wound bed. There were small areas of black tissue, but appeared to more likely be dried blood/blood blisters. Pt's wound is healing well with minimal necrotic tissue and appears to be able to be managed by dressing changes by nursing at this time. Notified Dr. Greta Doom and hydrotherapy will sign off at this time. Please re-consult if needed. Wound Therapy - Functional Problem List: Decreased strength/AROM mostly of R middle finger, but also in other fingers on R hand and R wrist. Factors Delaying/Impairing Wound Healing: Infection - systemic/local, Substance abuse Hydrotherapy Plan: Dressing change, Patient/family education, Pulsatile lavage with suction Wound Therapy - Frequency: Other (comment) (2x/week) Wound Therapy - Follow Up Recommendations: dressing changes by family/patient, dressing changes by RN  Wound Therapy Goals- Improve the function of patient's integumentary system by progressing the wound(s) through the phases of wound healing (inflammation - proliferation - remodeling) by: Wound Therapy Goals - Improve the function of patient's integumentary system by progressing the wound(s) through the phases of wound healing by: Decrease Necrotic Tissue to: 0 Decrease Necrotic Tissue - Progress: Partly met Increase Granulation Tissue to: 100 Increase Granulation  Tissue - Progress: Partly met Patient/Family will be able to : perform full dressing change with supervision and <25% cues.  Patient/Family Instruction Goal - Progress: Partly met Goals/treatment plan/discharge plan were made with and agreed upon by patient/family: Yes Time For Goal Achievement: 7 days Wound Therapy - Potential for Goals: Excellent  Goals will be updated until maximal potential achieved or discharge criteria met.  Discharge criteria: when goals achieved, discharge from hospital, MD decision/surgical intervention, no progress towards goals, refusal/missing three consecutive treatments without notification or medical reason.  GP     Charges PT Wound Care Charges $Wound Debridement up to 20 cm: < or equal to 20 cm $PT Hydrotherapy Dressing: 3 dressings $PT Hydrotherapy Visit: 1 Visit      Moishe Spice, PT, DPT Acute Rehabilitation Services  Office: [phone]   Orvan Falconer 12/24/2021, 3:39 PM

## 2021-12-24 NOTE — Progress Notes (Signed)
PROGRESS NOTE        PATIENT DETAILS Name: Eric Woods Age: 46 y.o. Sex: male Date of Birth: Feb 28, 1976 Admit Date: 12/18/2021 Admitting Physician Clydie Braun, MD PTW:[id number], No Pcp Per  Brief Summary: Patient is a 46 y.o.  male with history of methamphetamine use-presented with right middle finger infection.   Significant events: 7/11>> admit to [facility] for right middle finger infection.  Significant studies: 7/12>> x-ray right hand: No fracture/dislocation.  Significant microbiology data: 7/12>> right finger wound/intraoperative cultures: Staph aureus-sensitivities pending 7/12>> acute hepatitis serology: Negative 7/12>> HIV: Nonreactive  Procedures: 7/12>>Right middle finger incision and drainage of flexor tendon sheath 7/14>> incision and drainage of right ear abscess.  Consults: Hand surgery. ENT  Subjective: Patient in bed, appears comfortable, denies any headache, no fever, no chest pain or pressure, no shortness of breath , no abdominal pain. No focal weakness.   Objective: Vitals: Blood pressure 130/86, pulse (!) 55, temperature 98 F (36.7 C), temperature source Oral, resp. rate 17, height 6' (1.829 m), weight 93 kg, SpO2 96 %.   Exam:  Awake Alert, No new F.N deficits, right earlobe I&D site stable, right middle finger under bandage . [place].AT,PERRAL Supple Neck, No JVD,   Symmetrical Chest wall movement, Good air movement bilaterally, CTAB RRR,No Gallops, Rubs or new Murmurs,  +ve B.Sounds, Abd Soft, No tenderness,   No Cyanosis, Clubbing or edema      Assessment/Plan:  Right middle finger infection: S/p I&D on 7/12-by hand surgeon Dr. Gomez Cleverly intraoperative cultures positive for MRSA-continue vancomycin.  Hand surgery following-to start on hydrotherapy today.   Plan per surgeon - for twice daily soaks and dressing changes with warm soapy water and chlorhexidine if available.  15-minute soaks at a time twice a day  followed by a clean bandage with nonstick and rolled gauze   Right ear abscess: Worsening-ENT consulted and I&D performed by Dr Suzanna Obey.   Already on empiric antibiotics.    Small pustular area in the right oral cavity: Unclear if this is an abscess-but already on antibiotics-ENT does not think that this needs I&D at this point.  Follow.  Transaminitis: Probably due to drug use-thankfully downtrending.  Acute hepatitis serology negative.  Continue to trend periodically.  Methamphetamine use: Denies IV use-only snorts.  Have counseled.  BMI: Estimated body mass index is 27.8 kg/m as calculated from the following:   Height as of this encounter: 6' (1.829 m).   Weight as of this encounter: 93 kg.   Code status:   Code Status: Full Code   DVT Prophylaxis: enoxaparin (LOVENOX) injection 40 mg Start: 12/19/21 1630   Family Communication: Mother bedside on 12/24/2021   Disposition Plan: Status is: Inpatient Remains inpatient appropriate because: Right middle finger infection-not yet stable for discharge-hydrotherapy planned from 7/14-on IV antibiotics.   Planned Discharge Destination:Home likely over the next few days.   Diet: Diet Order             Diet regular Room service appropriate? Yes; Fluid consistency: Thin  Diet effective now                   MEDICATIONS: Scheduled Meds:  enoxaparin (LOVENOX) injection  40 mg Subcutaneous Q24H   mupirocin ointment  1 Application Nasal BID   sodium chloride flush  3 mL Intravenous Q12H   Continuous Infusions:  vancomycin  Stopped (12/24/21 0027)   PRN Meds:.acetaminophen **OR** acetaminophen, albuterol, hydrALAZINE, ondansetron **OR** ondansetron (ZOFRAN) IV, oxyCODONE   I have personally reviewed following labs and imaging studies  LABORATORY DATA:  Recent Labs  Lab 12/19/21 0027 12/20/21 0042 12/21/21 0146 12/24/21 0340  WBC 13.7* 12.6* 9.7 7.8  HGB 12.8* 13.1 12.8* 12.8*  HCT 36.6* 37.9* 38.5* 38.6*  PLT  287 258 301 352  MCV 96.6 97.9 100.3* 99.5  MCH 33.8 33.9 33.3 33.0  MCHC 35.0 34.6 33.2 33.2  RDW 12.4 12.5 12.5 12.2  LYMPHSABS 0.9  --   --  2.1  MONOABS 1.0  --   --  0.7  EOSABS 0.3  --   --  0.6*  BASOSABS 0.1  --   --  0.1    Recent Labs  Lab 12/19/21 0027 12/19/21 1650 12/20/21 0042 12/22/21 0053 12/24/21 0340  NA 137  --  139 140 139  K 3.3*  --  3.7 3.7 3.5  CL 103  --  103 108 109  CO2 27  --  27 27 24   GLUCOSE 124*  --  131* 97 111*  BUN 10  --  6 11 8   CREATININE 0.87  --  0.68 0.72 0.69  CALCIUM 8.7*  --  8.6* 8.4* 8.5*  AST 106*  --  50* 41  --   ALT 127*  --  87* 60*  --   ALKPHOS 64  --  59 56  --   BILITOT 0.7  --  0.8 0.5  --   ALBUMIN 3.6  --  3.1* 2.7*  --   CRP  --  8.3*  --   --   --   LATICACIDVEN  --  0.9  --   --   --       LOS: 4 days   Signature  M.D on 12/24/2021 at 11:00 AM   -  To page go to [email or web address]

## 2021-12-24 NOTE — Progress Notes (Signed)
Patient ID: Eric Woods, male   DOB: 1975/12/01, 46 y.o.   MRN: [id number]  His ear and mouth are substantially better. He has no p[ain and is happy with the site.  The ear has no further drainage and the swelling has decreased to almost resolved. The mouth is also decreased significantly and has no further drainage.   He no further treatment surgically for these areas now and they should finish healing completely with medical therapy. He should follow up if he is having further trouble or increased symptoms.

## 2021-12-25 LAB — TSH: TSH: 1.443 u[IU]/mL (ref 0.350–4.500)

## 2021-12-25 LAB — CBC WITH DIFFERENTIAL/PLATELET
Abs Immature Granulocytes: 0.03 10*3/uL (ref 0.00–0.07)
Basophils Absolute: 0.1 10*3/uL (ref 0.0–0.1)
Basophils Relative: 2 %
Eosinophils Absolute: 0.5 10*3/uL (ref 0.0–0.5)
Eosinophils Relative: 7 %
HCT: 38.4 % — ABNORMAL LOW (ref 39.0–52.0)
Hemoglobin: 13.2 g/dL (ref 13.0–17.0)
Immature Granulocytes: 0 %
Lymphocytes Relative: 26 %
Lymphs Abs: 2 10*3/uL (ref 0.7–4.0)
MCH: 33.8 pg (ref 26.0–34.0)
MCHC: 34.4 g/dL (ref 30.0–36.0)
MCV: 98.2 fL (ref 80.0–100.0)
Monocytes Absolute: 0.7 10*3/uL (ref 0.1–1.0)
Monocytes Relative: 9 %
Neutro Abs: 4.2 10*3/uL (ref 1.7–7.7)
Neutrophils Relative %: 56 %
Platelets: 386 10*3/uL (ref 150–400)
RBC: 3.91 MIL/uL — ABNORMAL LOW (ref 4.22–5.81)
RDW: 12.4 % (ref 11.5–15.5)
WBC: 7.6 10*3/uL (ref 4.0–10.5)
nRBC: 0 % (ref 0.0–0.2)

## 2021-12-25 MED ORDER — POTASSIUM CHLORIDE CRYS ER 20 MEQ PO TBCR
40.0000 meq | EXTENDED_RELEASE_TABLET | Freq: Once | ORAL | Status: AC
  Administered 2021-12-25: 40 meq via ORAL
  Filled 2021-12-25: qty 2

## 2021-12-25 NOTE — Progress Notes (Signed)
PROGRESS NOTE        PATIENT DETAILS Name: Eric Woods Age: 46 y.o. Sex: male Date of Birth: 14-Jul-1975 Admit Date: 12/18/2021 Admitting Physician Clydie Braun, MD WHQ:[id number], No Pcp Per  Brief Summary: Patient is a 46 y.o.  male with history of methamphetamine use-presented with right middle finger infection.   Significant events: 7/11>> admit to [facility] for right middle finger infection.  Significant studies: 7/12>> x-ray right hand: No fracture/dislocation.  Significant microbiology data: 7/12>> right finger wound/intraoperative cultures: MRSA 7/12>> acute hepatitis serology: Negative 7/12>> HIV: Nonreactive  Procedures: 7/12>>Right middle finger incision and drainage of flexor tendon sheath 7/14>> incision and drainage of right ear abscess.  Consults: Hand surgery. ENT   Subjective: Patient in bed, appears comfortable, denies any headache, no fever, no chest pain or pressure, no shortness of breath , no abdominal pain. No new focal weakness.   Objective: Vitals: Blood pressure 131/85, pulse 63, temperature 98.3 F (36.8 C), temperature source Oral, resp. rate 16, height 6' (1.829 m), weight 93 kg, SpO2 98 %.   Exam:  Awake Alert, No new F.N deficits, right earlobe I&D site stable, right middle finger under bandage . [place].AT,PERRAL Supple Neck, No JVD,   Symmetrical Chest wall movement, Good air movement bilaterally, CTAB RRR,No Gallops, Rubs or new Murmurs,  +ve B.Sounds, Abd Soft, No tenderness,   No Cyanosis, Clubbing or edema       Assessment/Plan:  Right middle finger infection: S/p I&D on 7/12-by hand surgeon Dr. Gomez Cleverly intraoperative cultures positive for MRSA-continue vancomycin.  Hand surgery following-to start on hydrotherapy today.   Plan per surgeon - for twice daily soaks and dressing changes with warm soapy water and chlorhexidine if available.  15-minute soaks at a time twice a day followed by a clean  bandage with nonstick and rolled gauze   Right ear abscess: Worsening-ENT consulted and I&D performed by Dr Suzanna Obey.   Already on empiric antibiotics.    Small pustular area in the right oral cavity: Unclear if this is an abscess-but already on antibiotics-ENT does not think that this needs I&D at this point.  Follow.  Transaminitis: Probably due to drug use-thankfully downtrending.  Acute hepatitis serology negative.  Continue to trend periodically.  Methamphetamine use: Denies IV use-only snorts.  Have counseled.  BMI: Estimated body mass index is 27.8 kg/m as calculated from the following:   Height as of this encounter: 6' (1.829 m).   Weight as of this encounter: 93 kg.   Code status:   Code Status: Full Code   DVT Prophylaxis: enoxaparin (LOVENOX) injection 40 mg Start: 12/19/21 1630   Family Communication: Mother bedside on 12/24/2021   Disposition Plan: Status is: Inpatient Remains inpatient appropriate because: Right middle finger infection-not yet stable for discharge-hydrotherapy planned from 7/14-on IV antibiotics.   Planned Discharge Destination:Home likely over the next few days.   Diet: Diet Order             Diet regular Room service appropriate? Yes; Fluid consistency: Thin  Diet effective now                   MEDICATIONS: Scheduled Meds:  enoxaparin (LOVENOX) injection  40 mg Subcutaneous Q24H   sodium chloride flush  3 mL Intravenous Q12H   Continuous Infusions:  vancomycin 1,500 mg (12/25/21 0940)   PRN Meds:.acetaminophen **OR**  acetaminophen, albuterol, hydrALAZINE, ondansetron **OR** ondansetron (ZOFRAN) IV, oxyCODONE   I have personally reviewed following labs and imaging studies  LABORATORY DATA:  Recent Labs  Lab 12/19/21 0027 12/20/21 0042 12/21/21 0146 12/24/21 0340 12/25/21 0507  WBC 13.7* 12.6* 9.7 7.8 7.6  HGB 12.8* 13.1 12.8* 12.8* 13.2  HCT 36.6* 37.9* 38.5* 38.6* 38.4*  PLT 287 258 301 352 386  MCV 96.6 97.9  100.3* 99.5 98.2  MCH 33.8 33.9 33.3 33.0 33.8  MCHC 35.0 34.6 33.2 33.2 34.4  RDW 12.4 12.5 12.5 12.2 12.4  LYMPHSABS 0.9  --   --  2.1 2.0  MONOABS 1.0  --   --  0.7 0.7  EOSABS 0.3  --   --  0.6* 0.5  BASOSABS 0.1  --   --  0.1 0.1    Recent Labs  Lab 12/19/21 0027 12/19/21 1650 12/20/21 0042 12/22/21 0053 12/24/21 0340  NA 137  --  139 140 139  K 3.3*  --  3.7 3.7 3.5  CL 103  --  103 108 109  CO2 27  --  27 27 24   GLUCOSE 124*  --  131* 97 111*  BUN 10  --  6 11 8   CREATININE 0.87  --  0.68 0.72 0.69  CALCIUM 8.7*  --  8.6* 8.4* 8.5*  AST 106*  --  50* 41  --   ALT 127*  --  87* 60*  --   ALKPHOS 64  --  59 56  --   BILITOT 0.7  --  0.8 0.5  --   ALBUMIN 3.6  --  3.1* 2.7*  --   CRP  --  8.3*  --   --   --   LATICACIDVEN  --  0.9  --   --   --   TSH  --   --   --   --  1.443      LOS: 5 days   Signature  M.D on 12/25/2021 at 11:32 AM   -  To page go to [email or web address]

## 2021-12-26 LAB — VANCOMYCIN, PEAK: Vancomycin Pk: 18 ug/mL — ABNORMAL LOW (ref 30–40)

## 2021-12-26 LAB — VANCOMYCIN, TROUGH: Vancomycin Tr: 38 ug/mL (ref 15–20)

## 2021-12-26 MED ORDER — DOXYCYCLINE HYCLATE 100 MG PO TABS
100.0000 mg | ORAL_TABLET | Freq: Two times a day (BID) | ORAL | Status: DC
  Administered 2021-12-27: 100 mg via ORAL
  Filled 2021-12-26: qty 1

## 2021-12-26 NOTE — Progress Notes (Addendum)
Pharmacy Antibiotic Note  Eric Woods is a 46 y.o. male admitted on 12/18/2021 with  wound infections . PMH notable for methamphetamine use, social history significant for homelessness.  S/p I&D right hand on 7/12. S/P I&D of right ear abscess on 7/14. OR cultures growing MRSA (R-Bactrim, S-tetracycline). Pharmacy has been consulted for vancomycin dosing.   Levels Timing of doses: 7/18 PM dose administered 2150 (on time); 7/19 AM dose administered 0832 (early) Vp= 18 (drawn 0432 7/19; ordered for 0100) Vt= 38 (drawn 0908 7/19; ordered for 0930)  As the vancomycin peak was drawn late, it does not represent a true peak, but rather a random level. As the vancomycin trough was drawn while vancomycin was being administered to the patient, this level is of no clinical utility and does not represent either a peak or trough as the vancomycin had not yet distributed.  As these levels do not provide [place] with any additional information regarding the appropriateness of the patient's current dose, and the patient is clinically stable with plans to transition to PO antibiotics tomorrow, we will not respond to these levels.    Plan: CONTINUE Vancomycin 1500 mg IV Q12H (stop date entered with last dose 7/19 PM) START doxycycline 100 mg PO Q12H on 12/27/2021 x7d (per TRH note)   Height: 6' (182.9 cm) Weight: 93 kg (205 lb) IBW/kg (Calculated) : 77.6  Temp (24hrs), Avg:98.2 F (36.8 C), Min:98 F (36.7 C), Max:98.4 F (36.9 C)  Recent Labs  Lab 12/19/21 1650 12/20/21 0042 12/21/21 0146 12/22/21 0053 12/24/21 0340 12/25/21 0507 12/26/21 0432 12/26/21 0908  WBC  --  12.6* 9.7  --  7.8 7.6  --   --   CREATININE  --  0.68  --  0.72 0.69  --   --   --   LATICACIDVEN 0.9  --   --   --   --   --   --   --   VANCOTROUGH  --   --   --   --   --   --   --  35*  VANCOPEAK  --   --   --   --   --   --  18*  --      Estimated Creatinine Clearance: 128 mL/min (by C-G formula based on SCr of 0.69 mg/dL).     Allergies  Allergen Reactions   Penicillins Other (See Comments)    Childhood allergy    Antimicrobials this admission: Vanc 7/12 >>(7/19) CTX 7/12 >>7/14 Cefazolin x 1 7/12  Microbiology results: 7/12 Wound Cx: moderate staphylococcus aureus 7/12 MRSA PCR: positive  Thank you for allowing pharmacy to be a part of this patient's care.  Jani Gravel, PharmD PGY-2 Infectious Diseases Resident  12/26/2021 11:47 AM

## 2021-12-26 NOTE — TOC Initial Note (Addendum)
Transition of Care [facility]) - Initial/Assessment Note    Patient Details  Name: Eric Woods MRN: [id number] Date of Birth: 01-07-1976  Transition of Care [facility]) CM/SW Contact:    Harriet Masson, RN Phone Number: 12/26/2021, 12:22 PM  Clinical Narrative:                  Spoke to patient regarding transition needs. Patient is agreeable for RNCM to make orthopedic, PCP and wound care clinic apts. Bedside nurse notified to send patient home with 5 days of supplies for dressing care and to teach patient how to change the dressing.  Patient agreeable for the [facility] to send his information to [facility] to review. MATCH letter done and patient states he can afford the $3 copay. Patient states his mother can transport him home at discharge.  CSW notified that patient would like SA resources.  TOC will continue to follow for needs. Expected Discharge Plan: Home/Self Care Barriers to Discharge: Continued Medical Work up   Patient Goals and CMS Choice        Expected Discharge Plan and Services Expected Discharge Plan: Home/Self Care In-house Referral: Artist, PCP / Health Connect Discharge Planning Services: MATCH Program                                          Prior Living Arrangements/Services     Patient language and need for interpreter reviewed:: Yes        Need for Family Participation in Patient Care: Yes (Comment) Care giver support system in place?: Yes (comment)   Criminal Activity/Legal Involvement Pertinent to Current Situation/Hospitalization: No - Comment as needed  Activities of Daily Living Home Assistive Devices/Equipment: None ADL Screening (condition at time of admission) Patient's cognitive ability adequate to safely complete daily activities?: Yes Is the patient deaf or have difficulty hearing?: No Does the patient have difficulty seeing, even when wearing glasses/contacts?: No Does the patient have difficulty concentrating, remembering,  or making decisions?: No Patient able to express need for assistance with ADLs?: Yes Does the patient have difficulty dressing or bathing?: No Independently performs ADLs?: Yes (appropriate for developmental age) Does the patient have difficulty walking or climbing stairs?: No Weakness of Legs: None Weakness of Arms/Hands: None  Permission Sought/Granted                  Emotional Assessment Appearance:: Appears stated age Attitude/Demeanor/Rapport: Engaged, Gracious Affect (typically observed): Accepting, Apprehensive Orientation: : Oriented to Self, Oriented to Place, Oriented to  Time, Oriented to Situation Alcohol / Substance Use: Illicit Drugs Psych Involvement: No (comment)  Admission diagnosis:  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 [place] [address] Phone: [phone] Fax: [phone]     Social Determinants of Health (SDOH) Interventions    Readmission Risk Interventions     No data to display

## 2021-12-26 NOTE — TOC Progression Note (Signed)
Transition of Care [facility]) - Progression Note    Patient Details  Name: Eric Woods MRN: [id number] Date of Birth: 10-22-1975  Transition of Care [facility]) CM/SW Contact  Mearl Latin, LCSW Phone Number: 12/26/2021, 4:03 PM  Clinical Narrative:    CSW spoke with patient and provided homeless and substance use resources with info for the day center at the [facility]. Patient reported his mom should be able to give him a ride tomorrow. If that should change, please alert TOC to provide bus pass.    Expected Discharge Plan: Home/Self Care Barriers to Discharge: Continued Medical Work up  Expected Discharge Plan and Services Expected Discharge Plan: Home/Self Care In-house Referral: Artist, PCP / Health Connect Discharge Planning Services: MATCH Program                                           Social Determinants of Health (SDOH) Interventions    Readmission Risk Interventions     No data to display

## 2021-12-26 NOTE — Progress Notes (Signed)
PROGRESS NOTE        PATIENT DETAILS Name: Eric Woods Age: 46 y.o. Sex: male Date of Birth: February 14, 1976 Admit Date: 12/18/2021 Admitting Physician Clydie Braun, MD HUT:[id number], No Pcp Per  Brief Summary: Patient is a 46 y.o.  male with history of methamphetamine use-presented with right middle finger infection.   Significant events: 7/11>> admit to [facility] for right middle finger infection.  Significant studies: 7/12>> x-ray right hand: No fracture/dislocation.  Significant microbiology data: 7/12>> right finger wound/intraoperative cultures: MRSA 7/12>> acute hepatitis serology: Negative 7/12>> HIV: Nonreactive  Procedures: 7/12>>Right middle finger incision and drainage of flexor tendon sheath 7/14>> incision and drainage of right ear abscess.  Consults: Hand surgery. ENT   Subjective: Patient in bed, appears comfortable, denies any headache, no fever, no chest pain or pressure, no shortness of breath , no abdominal pain. No new focal weakness.   Objective: Vitals: Blood pressure 124/83, pulse (!) 56, temperature 98.2 F (36.8 C), temperature source Oral, resp. rate 16, height 6' (1.829 m), weight 93 kg, SpO2 98 %.   Exam:  Awake Alert, No new F.N deficits, right earlobe I&D site stable, right middle finger under bandage . [place].AT,PERRAL Supple Neck, No JVD,   Symmetrical Chest wall movement, Good air movement bilaterally, CTAB RRR,No Gallops, Rubs or new Murmurs,  +ve B.Sounds, Abd Soft, No tenderness,   No Cyanosis, Clubbing or edema    Assessment/Plan:  Right middle finger infection: S/p I&D on 7/12-by hand surgeon Dr. Gomez Cleverly intraoperative cultures positive for MRSA-continue vancomycin, switch to Bactrim on 12/27/2021 for 1 more week.  Case discussed with Dr. Gomez Cleverly on 12/26/2021 okay for home discharge with dressing changes at home, he has educated the patient how to do dressing changes, he will follow-up on the  patient in the next 2 to 3 days in the office.  Plan per surgeon - for twice daily soaks and dressing changes with warm soapy water and chlorhexidine if available.  15-minute soaks at a time twice a day followed by a clean bandage with nonstick and rolled gauze   Right ear abscess: Worsening-ENT consulted and I&D performed by Dr Suzanna Obey.   Already on empiric antibiotics.    Small pustular area in the right oral cavity: Unclear if this is an abscess-but already on antibiotics-ENT does not think that this needs I&D at this point.  Follow.  Transaminitis: Probably due to drug use-thankfully downtrending.  Acute hepatitis serology negative.  Continue to trend periodically.  Methamphetamine use: Denies IV use-only snorts.  Have counseled.  BMI: Estimated body mass index is 27.8 kg/m as calculated from the following:   Height as of this encounter: 6' (1.829 m).   Weight as of this encounter: 93 kg.   Code status:   Code Status: Full Code   DVT Prophylaxis: enoxaparin (LOVENOX) injection 40 mg Start: 12/19/21 1630   Family Communication: Mother bedside on 12/24/2021   Disposition Plan: Status is: Inpatient Remains inpatient appropriate because: Right middle finger infection-not yet stable for discharge-hydrotherapy planned from 7/14-on IV antibiotics.   Planned Discharge Destination:Home likely over the next few days.   Diet: Diet Order             Diet regular Room service appropriate? Yes; Fluid consistency: Thin  Diet effective now  MEDICATIONS: Scheduled Meds:  enoxaparin (LOVENOX) injection  40 mg Subcutaneous Q24H   sodium chloride flush  3 mL Intravenous Q12H   Continuous Infusions:  vancomycin 1,500 mg (12/26/21 0832)   PRN Meds:.acetaminophen **OR** acetaminophen, albuterol, hydrALAZINE, ondansetron **OR** ondansetron (ZOFRAN) IV, oxyCODONE   I have personally reviewed following labs and imaging studies  LABORATORY DATA:  Recent Labs   Lab 12/20/21 0042 12/21/21 0146 12/24/21 0340 12/25/21 0507  WBC 12.6* 9.7 7.8 7.6  HGB 13.1 12.8* 12.8* 13.2  HCT 37.9* 38.5* 38.6* 38.4*  PLT 258 301 352 386  MCV 97.9 100.3* 99.5 98.2  MCH 33.9 33.3 33.0 33.8  MCHC 34.6 33.2 33.2 34.4  RDW 12.5 12.5 12.2 12.4  LYMPHSABS  --   --  2.1 2.0  MONOABS  --   --  0.7 0.7  EOSABS  --   --  0.6* 0.5  BASOSABS  --   --  0.1 0.1    Recent Labs  Lab 12/19/21 1650 12/20/21 0042 12/22/21 0053 12/24/21 0340  NA  --  139 140 139  K  --  3.7 3.7 3.5  CL  --  103 108 109  CO2  --  27 27 24   GLUCOSE  --  131* 97 111*  BUN  --  6 11 8   CREATININE  --  0.68 0.72 0.69  CALCIUM  --  8.6* 8.4* 8.5*  AST  --  50* 41  --   ALT  --  87* 60*  --   ALKPHOS  --  59 56  --   BILITOT  --  0.8 0.5  --   ALBUMIN  --  3.1* 2.7*  --   CRP 8.3*  --   --   --   LATICACIDVEN 0.9  --   --   --   TSH  --   --   --  1.443      LOS: 6 days   Signature  M.D on 12/26/2021 at 9:59 AM   -  To page go to [email or web address]

## 2021-12-27 ENCOUNTER — Other Ambulatory Visit (HOSPITAL_COMMUNITY): Payer: Self-pay

## 2021-12-27 MED ORDER — DOXYCYCLINE HYCLATE 100 MG PO TABS
100.0000 mg | ORAL_TABLET | Freq: Two times a day (BID) | ORAL | 0 refills | Status: DC
  Filled 2021-12-27: qty 14, 7d supply, fill #0

## 2021-12-27 MED ORDER — ACETAMINOPHEN 325 MG PO TABS
650.0000 mg | ORAL_TABLET | Freq: Four times a day (QID) | ORAL | 0 refills | Status: DC | PRN
  Filled 2021-12-27: qty 25, 3d supply, fill #0

## 2021-12-27 NOTE — Discharge Summary (Signed)
Eric Woods [id number] DOB: 08-29-1975 DOA: 12/18/2021  PCP: Patient, No Pcp Per  Admit date: 12/18/2021  Discharge date: 12/27/2021  Admitted From: Home   Disposition:  Home   Recommendations for Outpatient Follow-up:   Follow up with PCP in 1-2 weeks  PCP Please obtain BMP/CBC, 2 view CXR in 1week,  (see Discharge instructions)   PCP Please follow up on the following pending results:    Home Health: None   Equipment/Devices: None  Consultations: ENT, hand surgery Discharge Condition: Stable    CODE STATUS: Full    Diet Recommendation: Heart Healthy     Chief Complaint  Patient presents with   Wound Infection     Brief history of present illness from the day of admission and additional interim summary    46 y.o.  male with history of methamphetamine use-presented with right middle finger infection.     Significant events: 7/11>> admit to [facility] for right middle finger infection.   Significant studies: 7/12>> x-ray right hand: No fracture/dislocation.   Significant microbiology data: 7/12>> right finger wound/intraoperative cultures: MRSA 7/12>> acute hepatitis serology: Negative 7/12>> HIV: Nonreactive   Procedures: 7/12>>Right middle finger incision and drainage of flexor tendon sheath 7/14>> incision and drainage of right ear abscess.   Consults: Hand surgery. ENT                                                                 Hospital Course    Right middle finger infection: S/p I&D on 7/12-by hand surgeon Dr. Gomez Cleverly intraoperative cultures positive for MRSA-he was treated with IV vancomycin, twice daily dressing changes as below, right middle finger wound much improved with no surrounding cellulitis, case discussed with hand surgeon Dr. Gomez Cleverly multiple times including  12/26/2021, okay for home discharge she will follow-up with the patient coming Monday, dressing supplies provided to the patient placement per social work.   Right ear abscess: Worsening-ENT consulted and I&D performed by Dr Suzanna Obey.  Antibiotics as above, almost completely resolved clinically.   Small pustular area in the right oral cavity: Unclear if this is an abscess-but already on antibiotics-ENT does not think that this needs I&D at this point.  Clinically stable outpatient follow-up with PCP.  No symptoms at this time.   Transaminitis: Probably due to drug use-thankfully downtrending.  Acute hepatitis serology negative.  Trend much improved PCP to repeat CMP in 7 to 10 days.   Methamphetamine use: Denies IV use-only snorts.  Have counseled.  Discharge diagnosis     Principal Problem:   Flexor tenosynovitis of finger Active Problems:   Polysubstance abuse (HCC)   Transaminitis    Discharge instructions    Discharge Instructions     Diet - low sodium heart healthy   Complete by: As directed  Discharge instructions   Complete by: As directed    Keep your right had clean and dry at all times.  Dressing changes twice a day as instructed by the hand surgeon.  Follow with Primary MD  in 7 days   Get CBC, CMP -  checked next visit within 1 week by Primary MD    Activity: As tolerated with Full fall precautions use walker/cane & assistance as needed  Disposition Home    Diet: Heart Healthy   Special Instructions: If you have smoked or chewed Tobacco  in the last 2 yrs please stop smoking, stop any regular Alcohol  and or any Recreational drug use.  On your next visit with your primary care physician please Get Medicines reviewed and adjusted.  Please request your Prim.MD to go over all Hospital Tests and Procedure/Radiological results at the follow up, please get all Hospital records sent to your Prim MD by signing hospital release before you go home.  If you  experience worsening of your admission symptoms, develop shortness of breath, life threatening emergency, suicidal or homicidal thoughts you must seek medical attention immediately by calling 911 or calling your MD immediately  if symptoms less severe.  You Must read complete instructions/literature along with all the possible adverse reactions/side effects for all the Medicines you take and that have been prescribed to you. Take any new Medicines after you have completely understood and accpet all the possible adverse reactions/side effects.   Discharge wound care:   Complete by: As directed    Keep your right had clean and dry at all times.  Dressing changes twice a day as instructed by the hand surgeon.   Increase activity slowly   Complete by: As directed        Discharge Medications   Allergies as of 12/27/2021       Reactions   Penicillins Other (See Comments)   Childhood allergy        Medication List     TAKE these medications    acetaminophen 325 MG tablet Commonly known as: TYLENOL Take 2 tablets (650 mg total) by mouth every 6 (six) hours as needed for mild pain or moderate pain.   doxycycline 100 MG tablet Commonly known as: VIBRA-TABS Take 1 tablet (100 mg total) by mouth every 12 (twelve) hours.               Discharge Care Instructions  (From admission, onward)           Start     Ordered   12/27/21 0000  Discharge wound care:       Comments: Keep your right had clean and dry at all times.  Dressing changes twice a day as instructed by the hand surgeon.   12/27/21 1001             Follow-up Information     [place]. Schedule an appointment as soon as possible for a visit in 1 week(s).   Why: follow up, establish a primary care doctor. they have finacial counselling nad a pharmacy Contact information: [place] [phone]        Orene Desanctis, MD Follow up.   Specialty: Orthopedic Surgery Why: TIME : 12:20 PM DATE: JULY 24 , 2023 Contact information: [address] [place] [address] [id number]         Fredirick Maudlin, MD. Call.   Specialty: General Surgery Why:  TIME : 9:00 AM DATE : January 04, 2022 LOCATION : WOUND CLINIC Contact information: [address] [place] [address] [phone]                 Major procedures and Radiology Reports - PLEASE review detailed and final reports thoroughly  -       DG Hand Complete Right  Result Date: 12/19/2021 CLINICAL DATA:  Hand pain, no known injury. EXAM: RIGHT HAND - COMPLETE 3+ VIEW COMPARISON:  None Available. FINDINGS: There is no evidence of fracture or dislocation. There is no evidence of arthropathy or other focal bone abnormality. Soft tissues are unremarkable. IMPRESSION: Negative. Electronically Signed   By: Charlett Nose M.D.   On: 12/19/2021 00:48      Today   Subjective    [name] today has no headache,no chest abdominal pain,no new weakness tingling or numbness, feels much better wants to go home today.     Objective   Blood pressure 125/82, pulse 60, temperature 98.1 F (36.7 C), temperature source Oral, resp. rate 20, height 6' (1.829 m), weight 93 kg, SpO2 100 %.  No intake or output data in the 24 hours ending 12/27/21 1002  Exam  Awake Alert, No new F.N deficits,    [place].AT,PERRAL Supple Neck,   Symmetrical Chest wall movement, Good air movement bilaterally, CTAB RRR,No Gallops,   +ve B.Sounds, Abd Soft, Non tender,  Right earlobe and right middle finger along with right hand stable, right middle finger under bandage no surrounding cellulitis,   Data Review   Recent Labs  Lab 12/21/21 0146 12/24/21 0340 12/25/21 0507  WBC 9.7 7.8 7.6  HGB 12.8* 12.8* 13.2  HCT 38.5* 38.6* 38.4*  PLT 301 352 386  MCV 100.3* 99.5 98.2  MCH 33.3 33.0 33.8  MCHC 33.2 33.2 34.4  RDW 12.5 12.2  12.4  LYMPHSABS  --  2.1 2.0  MONOABS  --  0.7 0.7  EOSABS  --  0.6* 0.5  BASOSABS  --  0.1 0.1    Recent Labs  Lab 12/22/21 0053 12/24/21 0340  NA 140 139  K 3.7 3.5  CL 108 109  CO2 27 24  GLUCOSE 97 111*  BUN 11 8  CREATININE 0.72 0.69  CALCIUM 8.4* 8.5*  AST 41  --   ALT 60*  --   ALKPHOS 56  --   BILITOT 0.5  --   ALBUMIN 2.7*  --   TSH  --  1.443    Total Time in preparing paper work, data evaluation and todays exam - 35 minutes  Susa Raring M.D on 12/27/2021 at 10:02 AM  [facility]

## 2021-12-27 NOTE — Discharge Instructions (Signed)
Keep your right had clean and dry at all times.  Dressing changes twice a day as instructed by the hand surgeon.  Follow with Primary MD  in 7 days   Get CBC, CMP -  checked next visit within 1 week by Primary MD    Activity: As tolerated with Full fall precautions use walker/cane & assistance as needed  Disposition Home    Diet: Heart Healthy   Special Instructions: If you have smoked or chewed Tobacco  in the last 2 yrs please stop smoking, stop any regular Alcohol  and or any Recreational drug use.  On your next visit with your primary care physician please Get Medicines reviewed and adjusted.  Please request your Prim.MD to go over all Hospital Tests and Procedure/Radiological results at the follow up, please get all Hospital records sent to your Prim MD by signing hospital release before you go home.  If you experience worsening of your admission symptoms, develop shortness of breath, life threatening emergency, suicidal or homicidal thoughts you must seek medical attention immediately by calling 911 or calling your MD immediately  if symptoms less severe.  You Must read complete instructions/literature along with all the possible adverse reactions/side effects for all the Medicines you take and that have been prescribed to you. Take any new Medicines after you have completely understood and accpet all the possible adverse reactions/side effects.

## 2022-01-04 ENCOUNTER — Encounter (HOSPITAL_BASED_OUTPATIENT_CLINIC_OR_DEPARTMENT_OTHER): Payer: Self-pay | Admitting: General Surgery

## 2022-01-23 ENCOUNTER — Ambulatory Visit: Payer: Self-pay | Admitting: Physician Assistant

## 2022-02-13 ENCOUNTER — Other Ambulatory Visit (HOSPITAL_COMMUNITY): Payer: Self-pay

## 2022-02-14 ENCOUNTER — Encounter (HOSPITAL_COMMUNITY): Payer: Self-pay | Admitting: *Deleted

## 2022-02-14 ENCOUNTER — Inpatient Hospital Stay (HOSPITAL_COMMUNITY)
Admission: EM | Admit: 2022-02-14 | Discharge: 2022-03-05 | DRG: 501 | Disposition: A | Discharge status: Home / Self Care | Payer: Self-pay | Attending: Internal Medicine | Admitting: Internal Medicine

## 2022-02-14 ENCOUNTER — Other Ambulatory Visit: Payer: Self-pay

## 2022-02-14 DIAGNOSIS — B9562 Methicillin resistant Staphylococcus aureus infection as the cause of diseases classified elsewhere: Secondary | ICD-10-CM | POA: Diagnosis present

## 2022-02-14 DIAGNOSIS — F1990 Other psychoactive substance use, unspecified, uncomplicated: Secondary | ICD-10-CM

## 2022-02-14 DIAGNOSIS — M009 Pyogenic arthritis, unspecified: Principal | ICD-10-CM | POA: Diagnosis present

## 2022-02-14 DIAGNOSIS — Z23 Encounter for immunization: Secondary | ICD-10-CM

## 2022-02-14 DIAGNOSIS — L089 Local infection of the skin and subcutaneous tissue, unspecified: Secondary | ICD-10-CM | POA: Diagnosis present

## 2022-02-14 DIAGNOSIS — R3911 Hesitancy of micturition: Secondary | ICD-10-CM | POA: Diagnosis present

## 2022-02-14 DIAGNOSIS — F17200 Nicotine dependence, unspecified, uncomplicated: Secondary | ICD-10-CM | POA: Diagnosis present

## 2022-02-14 DIAGNOSIS — L0390 Cellulitis, unspecified: Secondary | ICD-10-CM

## 2022-02-14 DIAGNOSIS — Z880 Allergy status to penicillin: Secondary | ICD-10-CM

## 2022-02-14 DIAGNOSIS — L03116 Cellulitis of left lower limb: Secondary | ICD-10-CM

## 2022-02-14 DIAGNOSIS — L259 Unspecified contact dermatitis, unspecified cause: Secondary | ICD-10-CM | POA: Diagnosis present

## 2022-02-14 DIAGNOSIS — R7881 Bacteremia: Secondary | ICD-10-CM | POA: Diagnosis present

## 2022-02-14 DIAGNOSIS — M71062 Abscess of bursa, left knee: Secondary | ICD-10-CM | POA: Diagnosis present

## 2022-02-14 DIAGNOSIS — L03211 Cellulitis of face: Secondary | ICD-10-CM

## 2022-02-14 DIAGNOSIS — F1910 Other psychoactive substance abuse, uncomplicated: Secondary | ICD-10-CM | POA: Diagnosis present

## 2022-02-14 DIAGNOSIS — L02416 Cutaneous abscess of left lower limb: Secondary | ICD-10-CM | POA: Diagnosis present

## 2022-02-14 DIAGNOSIS — E876 Hypokalemia: Secondary | ICD-10-CM | POA: Diagnosis present

## 2022-02-14 DIAGNOSIS — F17210 Nicotine dependence, cigarettes, uncomplicated: Secondary | ICD-10-CM | POA: Diagnosis present

## 2022-02-14 DIAGNOSIS — F424 Excoriation (skin-picking) disorder: Secondary | ICD-10-CM

## 2022-02-14 DIAGNOSIS — L0201 Cutaneous abscess of face: Secondary | ICD-10-CM | POA: Diagnosis present

## 2022-02-14 DIAGNOSIS — Z8614 Personal history of Methicillin resistant Staphylococcus aureus infection: Secondary | ICD-10-CM

## 2022-02-14 DIAGNOSIS — L03213 Periorbital cellulitis: Secondary | ICD-10-CM | POA: Diagnosis present

## 2022-02-14 DIAGNOSIS — F1210 Cannabis abuse, uncomplicated: Secondary | ICD-10-CM | POA: Diagnosis present

## 2022-02-14 DIAGNOSIS — D649 Anemia, unspecified: Secondary | ICD-10-CM | POA: Diagnosis present

## 2022-02-14 DIAGNOSIS — E871 Hypo-osmolality and hyponatremia: Secondary | ICD-10-CM | POA: Diagnosis present

## 2022-02-14 DIAGNOSIS — F1510 Other stimulant abuse, uncomplicated: Secondary | ICD-10-CM | POA: Diagnosis present

## 2022-02-14 HISTORY — DX: Other psychoactive substance abuse, uncomplicated: F19.10

## 2022-02-14 HISTORY — DX: Methicillin resistant Staphylococcus aureus infection as the cause of diseases classified elsewhere: B95.62

## 2022-02-14 HISTORY — DX: Nicotine dependence, unspecified, uncomplicated: F17.200

## 2022-02-14 HISTORY — DX: Local infection of the skin and subcutaneous tissue, unspecified: L08.9

## 2022-02-14 NOTE — ED Provider Triage Note (Signed)
Emergency Medicine Provider Triage Evaluation Note  Orrin Yurkovich , a 46 y.o. male  was evaluated in triage.  Pt complains of skin eruption.  Started 2 days ago, its on his left thumb, forehead, left knee.  It is painful, denies any fevers, he tried Benadryl no relief.  Patient denies any IV drug use..  Review of Systems  Per HPI  Physical Exam  BP (!) 158/114   Pulse (!) 115   Temp (!) 97.4 F (36.3 C) (Oral)   Resp 18   SpO2 100%  Gen:   Awake, no distress   Resp:  Normal effort  MSK:   Moves extremities without difficulty  Other:  Patient declined imaging.  Erythematous forehead with multiple punctate lesions and bulging.  Tolerates passive ROM to right knee, slightly erythematous but no point bony tenderness or crepitus.  Medical Decision Making  Medically screening exam initiated at 8:21 PM.  Appropriate orders placed.  Marjorie Lussier was informed that the remainder of the evaluation will be completed by another provider, this initial triage assessment does not replace that evaluation, and the importance of remaining in the ED until their evaluation is complete.  Does not meet SIRS criteria.  Stable to be seen in the back.   Theron Arista, PA-C 02/14/22 2022

## 2022-02-14 NOTE — ED Triage Notes (Signed)
Pt c/o various ulceration to knee, hand and reports swelling to his eyes yesterday. L eye was swollen shut, blurred vision

## 2022-02-15 ENCOUNTER — Inpatient Hospital Stay (HOSPITAL_COMMUNITY): Payer: Self-pay

## 2022-02-15 ENCOUNTER — Emergency Department (HOSPITAL_COMMUNITY): Payer: Self-pay

## 2022-02-15 ENCOUNTER — Encounter (HOSPITAL_COMMUNITY): Payer: Self-pay | Admitting: Internal Medicine

## 2022-02-15 DIAGNOSIS — F17200 Nicotine dependence, unspecified, uncomplicated: Secondary | ICD-10-CM

## 2022-02-15 DIAGNOSIS — I38 Endocarditis, valve unspecified: Secondary | ICD-10-CM

## 2022-02-15 DIAGNOSIS — B9562 Methicillin resistant Staphylococcus aureus infection as the cause of diseases classified elsewhere: Secondary | ICD-10-CM | POA: Diagnosis present

## 2022-02-15 DIAGNOSIS — L089 Local infection of the skin and subcutaneous tissue, unspecified: Secondary | ICD-10-CM | POA: Diagnosis present

## 2022-02-15 DIAGNOSIS — L0390 Cellulitis, unspecified: Secondary | ICD-10-CM | POA: Diagnosis present

## 2022-02-15 LAB — BLOOD CULTURE ID PANEL (REFLEXED) - BCID2

## 2022-02-15 LAB — RAPID URINE DRUG SCREEN, HOSP PERFORMED
Amphetamines: POSITIVE — AB
Barbiturates: NOT DETECTED
Benzodiazepines: NOT DETECTED
Cocaine: NOT DETECTED
Opiates: NOT DETECTED
Tetrahydrocannabinol: POSITIVE — AB

## 2022-02-15 LAB — CBC WITH DIFFERENTIAL/PLATELET
Abs Immature Granulocytes: 0.08 10*3/uL — ABNORMAL HIGH (ref 0.00–0.07)
Basophils Absolute: 0.1 10*3/uL (ref 0.0–0.1)
Basophils Relative: 0 %
Eosinophils Absolute: 0.1 10*3/uL (ref 0.0–0.5)
Eosinophils Relative: 1 %
HCT: 41.8 % (ref 39.0–52.0)
Hemoglobin: 14.4 g/dL (ref 13.0–17.0)
Immature Granulocytes: 1 %
Lymphocytes Relative: 7 %
Lymphs Abs: 1.1 10*3/uL (ref 0.7–4.0)
MCH: 33.5 pg (ref 26.0–34.0)
MCHC: 34.4 g/dL (ref 30.0–36.0)
MCV: 97.2 fL (ref 80.0–100.0)
Monocytes Absolute: 1.3 10*3/uL — ABNORMAL HIGH (ref 0.1–1.0)
Monocytes Relative: 8 %
Neutro Abs: 13.2 10*3/uL — ABNORMAL HIGH (ref 1.7–7.7)
Neutrophils Relative %: 83 %
Platelets: 312 10*3/uL (ref 150–400)
RBC: 4.3 MIL/uL (ref 4.22–5.81)
RDW: 12.1 % (ref 11.5–15.5)
WBC: 15.9 10*3/uL — ABNORMAL HIGH (ref 4.0–10.5)
nRBC: 0 % (ref 0.0–0.2)

## 2022-02-15 LAB — COMPREHENSIVE METABOLIC PANEL
ALT: 18 U/L (ref 0–44)
AST: 19 U/L (ref 15–41)
Albumin: 3.7 g/dL (ref 3.5–5.0)
Alkaline Phosphatase: 64 U/L (ref 38–126)
Anion gap: 10 (ref 5–15)
BUN: 11 mg/dL (ref 6–20)
CO2: 27 mmol/L (ref 22–32)
Calcium: 9.4 mg/dL (ref 8.9–10.3)
Chloride: 95 mmol/L — ABNORMAL LOW (ref 98–111)
Creatinine, Ser: 1.11 mg/dL (ref 0.61–1.24)
GFR, Estimated: >60 mL/min (ref >60)
Glucose, Bld: 126 mg/dL — ABNORMAL HIGH (ref 70–99)
Potassium: 3.4 mmol/L — ABNORMAL LOW (ref 3.5–5.1)
Sodium: 132 mmol/L — ABNORMAL LOW (ref 135–145)
Total Bilirubin: 0.7 mg/dL (ref 0.3–1.2)
Total Protein: 7.3 g/dL (ref 6.5–8.1)

## 2022-02-15 LAB — ECHOCARDIOGRAM COMPLETE
Area-P 1/2: 5.02 cm2
Height: 72 in
S' Lateral: 3.2 cm
Weight: 2720 oz

## 2022-02-15 LAB — LACTIC ACID, PLASMA: Lactic Acid, Venous: 1.1 mmol/L (ref 0.5–1.9)

## 2022-02-15 MED ORDER — LACTATED RINGERS IV SOLN: INTRAVENOUS | Status: DC

## 2022-02-15 MED ORDER — ONDANSETRON HCL 4 MG PO TABS: 4.0000 mg | ORAL_TABLET | Freq: Four times a day (QID) | ORAL | Status: DC | PRN

## 2022-02-15 MED ORDER — LIDOCAINE-EPINEPHRINE (PF) 2 %-1:200000 IJ SOLN
20.0000 mL | Freq: Once | INTRAMUSCULAR | Status: AC
  Administered 2022-02-15: 20 mL
  Filled 2022-02-15: qty 20

## 2022-02-15 MED ORDER — SODIUM CHLORIDE 0.9% FLUSH
3.0000 mL | Freq: Two times a day (BID) | INTRAVENOUS | Status: DC
  Administered 2022-02-15 – 2022-03-05 (×34): 3 mL via INTRAVENOUS

## 2022-02-15 MED ORDER — VANCOMYCIN HCL 1500 MG/300ML IV SOLN
1500.0000 mg | Freq: Once | INTRAVENOUS | Status: AC
Start: 2022-02-15 — End: 2022-02-15
  Administered 2022-02-15: 1500 mg via INTRAVENOUS
  Filled 2022-02-15: qty 300

## 2022-02-15 MED ORDER — VANCOMYCIN HCL IN DEXTROSE 1-5 GM/200ML-% IV SOLN
1000.0000 mg | Freq: Two times a day (BID) | INTRAVENOUS | Status: DC
Start: 2022-02-15 — End: 2022-02-16
  Administered 2022-02-15 – 2022-02-16 (×2): 1000 mg via INTRAVENOUS
  Filled 2022-02-15 (×2): qty 200

## 2022-02-15 MED ORDER — ENOXAPARIN SODIUM 40 MG/0.4ML IJ SOSY
40.0000 mg | PREFILLED_SYRINGE | INTRAMUSCULAR | Status: DC
  Administered 2022-02-15 – 2022-03-05 (×19): 40 mg via SUBCUTANEOUS
  Filled 2022-02-15 (×19): qty 0.4

## 2022-02-15 MED ORDER — VANCOMYCIN HCL IN DEXTROSE 1-5 GM/200ML-% IV SOLN: 1000.0000 mg | Freq: Two times a day (BID) | INTRAVENOUS | Status: DC

## 2022-02-15 MED ORDER — ACETAMINOPHEN 325 MG PO TABS
650.0000 mg | ORAL_TABLET | Freq: Four times a day (QID) | ORAL | Status: DC | PRN
  Administered 2022-02-16 – 2022-02-17 (×2): 650 mg via ORAL
  Filled 2022-02-15 (×3): qty 2

## 2022-02-15 MED ORDER — BISACODYL 5 MG PO TBEC: 5.0000 mg | DELAYED_RELEASE_TABLET | Freq: Every day | ORAL | Status: DC | PRN

## 2022-02-15 MED ORDER — ONDANSETRON HCL 4 MG/2ML IJ SOLN: 4.0000 mg | Freq: Four times a day (QID) | INTRAMUSCULAR | Status: DC | PRN

## 2022-02-15 MED ORDER — DOCUSATE SODIUM 100 MG PO CAPS
100.0000 mg | ORAL_CAPSULE | Freq: Two times a day (BID) | ORAL | Status: DC
  Administered 2022-02-15 – 2022-03-04 (×23): 100 mg via ORAL
  Filled 2022-02-15 (×31): qty 1

## 2022-02-15 MED ORDER — KETOROLAC TROMETHAMINE 15 MG/ML IJ SOLN
15.0000 mg | Freq: Once | INTRAMUSCULAR | Status: AC
  Administered 2022-02-15: 15 mg via INTRAVENOUS
  Filled 2022-02-15: qty 1

## 2022-02-15 MED ORDER — ACETAMINOPHEN 650 MG RE SUPP: 650.0000 mg | Freq: Four times a day (QID) | RECTAL | Status: DC | PRN

## 2022-02-15 MED ORDER — POLYETHYLENE GLYCOL 3350 17 G PO PACK: 17.0000 g | PACK | Freq: Every day | ORAL | Status: DC | PRN

## 2022-02-15 MED ORDER — OXYCODONE HCL 5 MG PO TABS
5.0000 mg | ORAL_TABLET | ORAL | Status: DC | PRN
  Administered 2022-02-15 – 2022-02-27 (×32): 5 mg via ORAL
  Filled 2022-02-15 (×33): qty 1

## 2022-02-15 MED ORDER — HYDRALAZINE HCL 20 MG/ML IJ SOLN: 5.0000 mg | INTRAMUSCULAR | Status: DC | PRN

## 2022-02-15 MED ORDER — LIDOCAINE-EPINEPHRINE 1 %-1:100000 IJ SOLN
10.0000 mL | Freq: Once | INTRAMUSCULAR | Status: AC
Start: 2022-02-15 — End: 2022-02-15
  Administered 2022-02-15: 10 mL via INTRADERMAL
  Filled 2022-02-15: qty 1

## 2022-02-15 NOTE — ED Notes (Signed)
Patient in bathroom, will get vitals once patient returns.

## 2022-02-15 NOTE — ED Provider Notes (Signed)
[facility] EMERGENCY DEPARTMENT Provider Note   CSN: [id number] Arrival date & time: 02/14/22  2000     History  Chief Complaint  Patient presents with   Skin Ulcer    Eric Woods is a 46 y.o. male.  The history is provided by the patient and a significant other.   Patient with history of methamphetamine abuse presents with skin lesions throughout his body.  Patient reports that he picks at his skin frequently due to his underlying meth use.  He reports he has had increasing pain, swelling or redness of the left knee.  This is been ongoing for about 2 days.  He also reports multiple lesions to his forehead causing swelling around the left eye.  No fevers or vomiting reported.  He is not diabetic.  He denies IV substance use, only snorts meth    Home Medications Prior to Admission medications   Medication Sig Start Date End Date Taking? Authorizing Provider  acetaminophen (TYLENOL) 325 MG tablet Take 2 tablets (650 mg total) by mouth every 6 (six) hours as needed for mild pain or moderate pain. 12/27/21   Leroy Sea, MD  doxycycline (VIBRA-TABS) 100 MG tablet Take 1 tablet (100 mg total) by mouth every 12 (twelve) hours. 12/27/21   Leroy Sea, MD      Allergies    Penicillins    Review of Systems   Review of Systems  Constitutional:  Negative for fever.  Skin:  Positive for wound.    Physical Exam Updated Vital Signs BP (!) 158/105   Pulse (!) 110   Temp 98.9 F (37.2 C)   Resp 18   Ht 1.829 m (6')   Wt 77.1 kg   SpO2 100%   BMI 23.06 kg/m  Physical Exam CONSTITUTIONAL: Disheveled, no acute distress HEAD: Multiple areas of excoriation that appear secondarily infected causing left-sided periorbital edema Lesions or throughout the forehead Fluctuance noted to some of the lesions.  See photo below EYES: EOMI/PERRL, left periorbital edema is noted, no proptosis ENMT: Mucous membranes moist NECK: supple no meningeal signs LUNGS:   no apparent distress ABDOMEN: soft, nontender, no rebound or guarding, bowel sounds noted throughout abdomen GU:no cva tenderness NEURO: Pt is awake/alert/appropriate, moves all extremitiesx4.  No facial droop.   EXTREMITIES: pulses normal/equal, full ROM Erythema, tenderness and swelling is noted to the left knee.  No crepitus.  Limitation of range of motion of left knee. see photo SKIN: See photo below Patient with multiple excoriations noted throughout his body.  Lesions on the forehead and left knee are secondarily infected PSYCH: no abnormalities of mood noted, alert and oriented to situation      Patient gave verbal permission to utilize photo for medical documentation only The image was not stored on any personal device ED Results / Procedures / Treatments   Labs (all labs ordered are listed, but only abnormal results are displayed) Labs Reviewed  COMPREHENSIVE METABOLIC PANEL - Abnormal; Notable for the following components:      Result Value   Sodium 132 (*)    Potassium 3.4 (*)    Chloride 95 (*)    Glucose, Bld 126 (*)    All other components within normal limits  CBC WITH DIFFERENTIAL/PLATELET - Abnormal; Notable for the following components:   WBC 15.9 (*)    Neutro Abs 13.2 (*)    Monocytes Absolute 1.3 (*)    Abs Immature Granulocytes 0.08 (*)    All other components within  normal limits  CULTURE, BLOOD (ROUTINE X 2)  CULTURE, BLOOD (ROUTINE X 2)  LACTIC ACID, PLASMA  RAPID URINE DRUG SCREEN, HOSP PERFORMED    EKG None  Radiology DG Knee Complete 4 Views Left  Result Date: 02/15/2022 CLINICAL DATA:  Left knee swelling and pain. EXAM: LEFT KNEE - COMPLETE 4+ VIEW COMPARISON:  None Available. FINDINGS: No evidence of fracture, dislocation, or joint effusion. No evidence of arthropathy or other focal bone abnormality. There is moderate broad-based anterior soft tissue swelling. IMPRESSION: Anterior soft tissue swelling without acute underlying bone abnormality  or visible joint effusion. Electronically Signed   By: Telford Nab M.D.   On: 02/15/2022 05:56    Procedures .Marland KitchenIncision and Drainage  Date/Time: 02/15/2022 6:20 AM  Performed by: Ripley Fraise, MD Authorized by: Ripley Fraise, MD   Consent:    Consent obtained:  Verbal   Consent given by:  Patient   Alternatives discussed:  No treatment Universal protocol:    Patient identity confirmed:  Provided demographic data Location:    Type:  Abscess   Location:  Head   Head/neck location: Forehead. Pre-procedure details:    Skin preparation:  Povidone-iodine Procedure type:    Complexity:  Complex Procedure details:    Ultrasound guidance: no     Needle aspiration: no     Incision types:  Single straight   Wound management:  Probed and deloculated   Drainage:  Purulent   Drainage amount:  Moderate   Wound treatment:  Wound left open Post-procedure details:    Procedure completion:  Tolerated     Medications Ordered in ED Medications  vancomycin (VANCOREADY) IVPB 1500 mg/300 mL (1,500 mg Intravenous New Bag/Given 02/15/22 0522)  lidocaine-EPINEPHrine (XYLOCAINE W/EPI) 2 %-1:200000 (PF) injection 20 mL (20 mLs Infiltration Given 02/15/22 0522)  ketorolac (TORADOL) 15 MG/ML injection 15 mg (15 mg Intravenous Given 02/15/22 0524)    ED Course/ Medical Decision Making/ A&P Clinical Course as of 02/15/22 [id number]  Fri Feb 15, 2022  0505 Pt with h/o dermatillomania/meth use with multiple area of excoriations and secondary cellulitis.  Pt will need admission  [DW]  0605 Glucose(!): 126 Hyperglycemia [DW]  0605 WBC(!): 15.9 Leukocytosis [DW]  0634 Patient with multiple areas of cellulitis and abscess throughout his body.  Suspect cellulitis of the left knee, less likely septic joint. [DW]  [id number] Abscess on forehead was drained in the emergency department.  He has other areas that may need drained in the hospital [DW]    Clinical Course User Index [DW] Ripley Fraise, MD                            Medical Decision Making Amount and/or Complexity of Data Reviewed Labs: ordered. Decision-making details documented in ED Course. Radiology: ordered.  Risk Prescription drug management. Decision regarding hospitalization.   This patient presents to the ED for concern of skin lesions, this involves an extensive number of treatment options, and is a complaint that carries with it a high risk of complications and morbidity.  The differential diagnosis includes but is not limited to colitis, abscess, necrotizing fasciitis  Comorbidities that complicate the patient evaluation: Patient's presentation is complicated by their history of dermatillomania  Social Determinants of Health: Patient's  substance use disorder   increases the complexity of managing their presentation  Additional history obtained: Additional history obtained from significant other Records reviewed previous admission documents  Lab Tests: I Ordered, and personally interpreted labs.  The pertinent results include: Leukocytosis  Imaging Studies ordered: I ordered imaging studies including X-ray left knee   I independently visualized and interpreted imaging which showed no joint effusion I agree with the radiologist interpretation  Medicines ordered and prescription drug management: I ordered medication including Toradol for pain Reevaluation of the patient after these medicines showed that the patient    stayed the same   Critical Interventions:  [place] for IV antibiotics  Consultations Obtained: I requested consultation with the admitting physician Dr. Margo Aye , and discussed  findings as well as pertinent plan - they recommend: We will admit for IV antibiotics  Reevaluation: After the interventions noted above, I reevaluated the patient and found that they have :stayed the same  Complexity of problems addressed: Patient's presentation is most consistent with  acute presentation with  potential threat to life or bodily function  Disposition: After consideration of the diagnostic results and the patient's response to treatment,  I feel that the patent would benefit from admission   .           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

## 2022-02-15 NOTE — ED Notes (Signed)
Patient transported to echo ?

## 2022-02-15 NOTE — Procedures (Signed)
Procedure: Left knee I&D   Indication: Left knee abscess   Surgeon: Charma Igo, PA-C   Assist: None   Anesthesia: 43ml 1% lidocaine with epi   EBL: None   Complications: None   Findings: After risks/benefits explained patient desires to undergo procedure. Consent obtained and time out performed. The left knee was sterilely prepped and draped. A 4cm longitudinal incision was made and the wound explored digitally and with a hemostat. A small amount of bloody fluid was obtained and sent for culture. Wound packed with 1/4" iodoform gauze and dressed. Pt tolerated the procedure well.       Freeman Caldron, PA-C Orthopedic Surgery [phone]

## 2022-02-15 NOTE — Progress Notes (Signed)
  Echocardiogram 2D Echocardiogram has been performed.  [name] 02/15/2022, 2:48 PM

## 2022-02-15 NOTE — H&P (View-Only) (Signed)
Reason for Consult:Left knee septic bursitis Referring Physician: Jonah Blue Time called: [phone] Time at bedside: 0913   Eric Woods is an 46 y.o. male.  HPI: Vian is well known to the orthopedic service. He suffers from frequent skin infections 2/2 his meth use. He comes in today with left knee pain and redness that's been going on about 2d. It's rapidly getting worse, enough so he doesn't think he can walk on it at this point. He also has a small lesion on his right hand that also cropped up recently and is painful. He squeezed that and got some clearish fluid out of it.  History reviewed. No pertinent past medical history.  Past Surgical History:  Procedure Laterality Date   ABDOMINAL SURGERY     HAND SURGERY     I & D EXTREMITY Right 12/19/2021   Procedure: IRRIGATION AND DEBRIDEMENT EXTREMITY;  Surgeon: Gomez Cleverly, MD;  Location: [facility] OR;  Service: Orthopedics;  Laterality: Right;    No family history on file.  Social History:  reports that he has been smoking cigarettes. He does not have any smokeless tobacco history on file. He reports current alcohol use. He reports current drug use. Drugs: Marijuana and Methamphetamines.  Allergies:  Allergies  Allergen Reactions   Penicillins Other (See Comments)    Childhood allergy    Medications: I have reviewed the patient's current medications.  Results for orders placed or performed during the hospital encounter of 02/14/22 (from the past 48 hour(s))  Lactic acid, plasma     Status: None   Collection Time: 02/15/22  4:58 AM  Result Value Ref Range   Lactic Acid, Venous 1.1 0.5 - 1.9 mmol/L    Comment: Performed at [facility], [address], [place], [place] [address]  Comprehensive metabolic panel     Status: Abnormal   Collection Time: 02/15/22  4:58 AM  Result Value Ref Range   Sodium 132 (L) 135 - 145 mmol/L   Potassium 3.4 (L) 3.5 - 5.1 mmol/L   Chloride 95 (L) 98 - 111 mmol/L   CO2 27 22 - 32 mmol/L    Glucose, Bld 126 (H) 70 - 99 mg/dL    Comment: Glucose reference range applies only to samples taken after fasting for at least 8 hours.   BUN 11 6 - 20 mg/dL   Creatinine, Ser 2.95 0.61 - 1.24 mg/dL   Calcium 9.4 8.9 - 18.8 mg/dL   Total Protein 7.3 6.5 - 8.1 g/dL   Albumin 3.7 3.5 - 5.0 g/dL   AST 19 15 - 41 U/L   ALT 18 0 - 44 U/L   Alkaline Phosphatase 64 38 - 126 U/L   Total Bilirubin 0.7 0.3 - 1.2 mg/dL   GFR, Estimated >41 >66 mL/min    Comment: (NOTE) Calculated using the CKD-EPI Creatinine Equation (2021)    Anion gap 10 5 - 15    Comment: Performed at [facility], [address], [place], [place] [address]  CBC with Differential     Status: Abnormal   Collection Time: 02/15/22  4:58 AM  Result Value Ref Range   WBC 15.9 (H) 4.0 - 10.5 K/uL   RBC 4.30 4.22 - 5.81 MIL/uL   Hemoglobin 14.4 13.0 - 17.0 g/dL   HCT 60.1 09.3 - 23.5 %   MCV 97.2 80.0 - 100.0 fL   MCH 33.5 26.0 - 34.0 pg   MCHC 34.4 30.0 - 36.0 g/dL   RDW 57.3 22.0 -  15.5 %   Platelets 312 150 - 400 K/uL   nRBC 0.0 0.0 - 0.2 %   Neutrophils Relative % 83 %   Neutro Abs 13.2 (H) 1.7 - 7.7 K/uL   Lymphocytes Relative 7 %   Lymphs Abs 1.1 0.7 - 4.0 K/uL   Monocytes Relative 8 %   Monocytes Absolute 1.3 (H) 0.1 - 1.0 K/uL   Eosinophils Relative 1 %   Eosinophils Absolute 0.1 0.0 - 0.5 K/uL   Basophils Relative 0 %   Basophils Absolute 0.1 0.0 - 0.1 K/uL   Immature Granulocytes 1 %   Abs Immature Granulocytes 0.08 (H) 0.00 - 0.07 K/uL    Comment: Performed at [facility], [address] [place], [place] [address]  Blood Culture (routine x 2)     Status: None (Preliminary result)   Collection Time: 02/15/22  4:58 AM   Specimen: BLOOD LEFT ARM  Result Value Ref Range   Specimen Description BLOOD LEFT ARM    Special Requests      BOTTLES DRAWN AEROBIC AND ANAEROBIC Blood Culture results may not be optimal due to an excessive volume of blood received in culture bottles   Culture      NO  GROWTH <12 HOURS Performed at [facility], [address] [place], [place] [address]    Report Status PENDING   Blood Culture (routine x 2)     Status: None (Preliminary result)   Collection Time: 02/15/22  4:58 AM   Specimen: BLOOD RIGHT FOREARM  Result Value Ref Range   Specimen Description BLOOD RIGHT FOREARM    Special Requests      BOTTLES DRAWN AEROBIC AND ANAEROBIC Blood Culture results may not be optimal due to an excessive volume of blood received in culture bottles   Culture      NO GROWTH <12 HOURS Performed at [facility], [address] [place], [place] [address]    Report Status PENDING     DG Knee Complete 4 Views Left  Result Date: 02/15/2022 CLINICAL DATA:  Left knee swelling and pain. EXAM: LEFT KNEE - COMPLETE 4+ VIEW COMPARISON:  None Available. FINDINGS: No evidence of fracture, dislocation, or joint effusion. No evidence of arthropathy or other focal bone abnormality. There is moderate broad-based anterior soft tissue swelling. IMPRESSION: Anterior soft tissue swelling without acute underlying bone abnormality or visible joint effusion. Electronically Signed   By: Keith  Chesser M.D.   On: 02/15/2022 05:56    Review of Systems  HENT:  Negative for ear discharge, ear pain, hearing loss and tinnitus.   Eyes:  Negative for photophobia and pain.  Respiratory:  Negative for cough and shortness of breath.   Cardiovascular:  Negative for chest pain.  Gastrointestinal:  Negative for abdominal pain, nausea and vomiting.  Genitourinary:  Negative for dysuria, flank pain, frequency and urgency.  Musculoskeletal:  Positive for arthralgias (Left knee, right hand). Negative for back pain, myalgias and neck pain.  Neurological:  Negative for dizziness and headaches.  Hematological:  Does not bruise/bleed easily.  Psychiatric/Behavioral:  The patient is not nervous/anxious.    Blood pressure (!) 158/105, pulse (!) 110, temperature 98.9 F (37.2 C), resp. rate  18, height 6' (1.829 m), weight 77.1 kg, SpO2 100 %. Physical Exam Constitutional:      General: He is not in acute distress.    Appearance: He is well-developed. He is not diaphoretic.  HENT:     Head: Normocephalic and atraumatic.    Eyes:     General: No scleral icterus.       Right eye: No discharge.        Left eye: No discharge.     Conjunctiva/sclera: Conjunctivae normal.  Cardiovascular:     Rate and Rhythm: Normal rate and regular rhythm.  Pulmonary:     Effort: Pulmonary effort is normal. No respiratory distress.  Musculoskeletal:     Cervical back: Normal range of motion.     Comments: Right shoulder, elbow, wrist, digits- Nodular mass base of palm, mobile, mild TTP, surrounding ecchymosis, no instability, no blocks to motion  Sens  Ax/R/M/U intact  Mot   Ax/ R/ PIN/ M/ AIN/ U intact  Rad 2+  LLE No traumatic wounds, ecchymosis, or rash  Knee erythematous, edematous, fluctuant. PROM 180-135 painless.  No knee effusion  Sens DPN, SPN, TN intact  Motor EHL, ext, flex, evers 5/5  DP 2+, PT 2+, No significant edema  Skin:    General: Skin is warm and dry.  Neurological:     Mental Status: He is alert.  Psychiatric:        Mood and Affect: Mood normal.        Behavior: Behavior normal.     Assessment/Plan: Left knee septic bursitis -- Will I&D. Already admitted for IV abx. 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 Should be checked for IE. He should f/u with Dr. Yehuda Budd for further workup.    Freeman Caldron, PA-C Orthopedic Surgery [phone] 02/15/2022, 9:20 AM

## 2022-02-15 NOTE — Progress Notes (Signed)
PHARMACY - PHYSICIAN COMMUNICATION CRITICAL VALUE ALERT - BLOOD CULTURE IDENTIFICATION (BCID)  Eric Woods is an 46 y.o. male who presented to [facility] on [facility]/14[facility]2022 with a chief complaint of cellulitis  Assessment:   2/2 blood cultures growing MRSA  Name of physician (or Provider) Contacted: Dr. Leafy Half  Current antibiotics: Vancomycin   Changes to prescribed antibiotics recommended:  No changes needed  Results for orders placed or performed during the hospital encounter of 02/14/22  Blood Culture ID Panel (Reflexed) (Collected: 02/15/2022  4:58 AM)  Result Value Ref Range   Enterococcus faecalis NOT DETECTED NOT DETECTED   Enterococcus Faecium NOT DETECTED NOT DETECTED   Listeria monocytogenes NOT DETECTED NOT DETECTED   Staphylococcus species DETECTED (A) NOT DETECTED   Staphylococcus aureus (BCID) DETECTED (A) NOT DETECTED   Staphylococcus epidermidis NOT DETECTED NOT DETECTED   Staphylococcus lugdunensis NOT DETECTED NOT DETECTED   Streptococcus species NOT DETECTED NOT DETECTED   Streptococcus agalactiae NOT DETECTED NOT DETECTED   Streptococcus pneumoniae NOT DETECTED NOT DETECTED   Streptococcus pyogenes NOT DETECTED NOT DETECTED   A.calcoaceticus-baumannii NOT DETECTED NOT DETECTED   Bacteroides fragilis NOT DETECTED NOT DETECTED   Enterobacterales NOT DETECTED NOT DETECTED   Enterobacter cloacae complex NOT DETECTED NOT DETECTED   Escherichia coli NOT DETECTED NOT DETECTED   Klebsiella aerogenes NOT DETECTED NOT DETECTED   Klebsiella oxytoca NOT DETECTED NOT DETECTED   Klebsiella pneumoniae NOT DETECTED NOT DETECTED   Proteus species NOT DETECTED NOT DETECTED   Salmonella species NOT DETECTED NOT DETECTED   Serratia marcescens NOT DETECTED NOT DETECTED   Haemophilus influenzae NOT DETECTED NOT DETECTED   Neisseria meningitidis NOT DETECTED NOT DETECTED   Pseudomonas aeruginosa NOT DETECTED NOT DETECTED   Stenotrophomonas maltophilia NOT DETECTED NOT DETECTED    Candida albicans NOT DETECTED NOT DETECTED   Candida auris NOT DETECTED NOT DETECTED   Candida glabrata NOT DETECTED NOT DETECTED   Candida krusei NOT DETECTED NOT DETECTED   Candida parapsilosis NOT DETECTED NOT DETECTED   Candida tropicalis NOT DETECTED NOT DETECTED   Cryptococcus neoformans/gattii NOT DETECTED NOT DETECTED   Meth resistant mecA/C and MREJ DETECTED (A) NOT DETECTED    Eddie Candle 02/15/2022  11:19 PM

## 2022-02-15 NOTE — Progress Notes (Signed)
Pharmacy Antibiotic Note  Eric Woods is a 46 y.o. male admitted on 02/14/2022 with cellulitis.  Pharmacy has been consulted for vancomycin dosing.  Plan: Vancomycin 1500 mg iv x1 dose f/b 1000 mg IV every 12 hours.  Scr utilized: 1.11 mg/dL eAUC 812 mcg*hr/mL   Height: 6' (182.9 cm) Weight: 77.1 kg (170 lb) IBW/kg (Calculated) : 77.6  Temp (24hrs), Avg:98.2 F (36.8 C), Min:97.4 F (36.3 C), Max:98.9 F (37.2 C)  Recent Labs  Lab 02/15/22 0458  WBC 15.9*  CREATININE 1.11  LATICACIDVEN 1.1    Estimated Creatinine Clearance: 91.6 mL/min (by C-G formula based on SCr of 1.11 mg/dL).    Allergies  Allergen Reactions   Penicillins Other (See Comments)    Childhood allergy    Antimicrobials this admission: vanc 9/8 >>    Thank you for allowing pharmacy to be a part of this patient's care.  Greta Doom BS, PharmD, BCPS Clinical Pharmacist 02/15/2022 7:01 AM  Contact: [phone] after 3 PM  "Be curious, not judgmental..." -Debbora Dus

## 2022-02-15 NOTE — H&P (Signed)
History and Physical    Patient: Eric Woods JGG:[id number] DOB: [birth year] DOA: 02/14/2022 DOS: the patient was seen and examined on 02/15/2022 PCP: Patient, No Pcp Per  Patient coming from: Home - lives with girlfriend; NOK: Girlfriend, Eric Woods, [phone]   Chief Complaint: Skin infection  HPI: Eric Woods is a 46 y.o. male with medical history significant of methamphetamine abuse and skin picking disorder with prior MRSA infection in 12/2021 presenting with skin infection.  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  He moved with his girlfriend to [place] and has been trying to stay clean but has struggled.  He continues to use meth, but "is done" with it now.  He also periodically smokes marijuana.  He is a chronic skin picker and picked the area on his forehead and also his left knee.  Yesterday, both his eyes were swollen shut but they are somewhat better today.  His left knee is swollen and painful.  He denies h/o ever using IV drugs.    ER Course:  Carryover, per Dr. Margo Aye:  46 year old male with history of methamphetamine abuse and chronically picking at his skin, presents with cellulitis involving his left knee, various stages of skin lesions due to picking.  Started on broad-spectrum IV antibiotics.     Review of Systems: As mentioned in the history of present illness. All other systems reviewed and are negative. Past Medical History:  Diagnosis Date   Infection of skin due to methicillin resistant Staphylococcus aureus (MRSA)    Polysubstance abuse (HCC)    Tobacco dependence    Past Surgical History:  Procedure Laterality Date   ABDOMINAL SURGERY     HAND SURGERY     I & D EXTREMITY Right 12/19/2021   Procedure: IRRIGATION AND DEBRIDEMENT EXTREMITY;  Surgeon: Gomez Cleverly, MD;  Location: [facility] OR;  Service: Orthopedics;  Laterality: Right;   Social History:  reports that he has been smoking  cigarettes. He does not have any smokeless tobacco history on file. He reports that he does not currently use alcohol. He reports current drug use. Drugs: Marijuana and Methamphetamines.  Allergies  Allergen Reactions   Penicillins Other (See Comments)    Childhood allergy    History reviewed. No pertinent family history.  Prior to Admission medications   Medication Sig Start Date End Date Taking? Authorizing Provider  acetaminophen (TYLENOL) 325 MG tablet Take 2 tablets (650 mg total) by mouth every 6 (six) hours as needed for mild pain or moderate pain. 12/27/21   Leroy Sea, MD  doxycycline (VIBRA-TABS) 100 MG tablet Take 1 tablet (100 mg total) by mouth every 12 (twelve) hours. 12/27/21   Leroy Sea, MD    Physical Exam: Vitals:   02/15/22 0454 02/15/22 0549 02/15/22 1130 02/15/22 1635  BP:  (!) 158/105 (!) 134/93 (!) 138/95  Pulse:  (!) 110 98 95  Resp:  18 18 16   Temp:   98.1 F (36.7 C) 98.4 F (36.9 C)  TempSrc:      SpO2:  100% 100% 100%  Weight: 77.1 kg     Height: 6' (1.829 m)      General:  Appears calm and comfortable and is in NAD, marked forehead lesions and L eyelid edema Eyes:  L eyelid edema    ENT:  grossly normal hearing, lips & tongue, mmm Neck:  no LAD, masses or thyromegaly Cardiovascular:  RRR, no m/r/g. No LE edema.  Respiratory:   CTA bilaterally with no wheezes/rales/rhonchi.  Normal respiratory effort. Abdomen:  soft, NT, ND, NABS Skin:  facial lesions as above; also with lesions of L knee with effusion and surrounding erythema/edema    Musculoskeletal:  grossly normal tone BUE/BLE, good ROM, no bony abnormality; L ankle monitoring system in place Psychiatric:  grossly normal mood and affect, speech fluent and appropriate, AOx3 Neurologic:  CN 2-12 grossly intact, moves all extremities in coordinated fashion   Radiological Exams on Admission: Independently reviewed - see discussion in A/P where applicable  ECHOCARDIOGRAM  COMPLETE  Result Date: 02/15/2022    ECHOCARDIOGRAM REPORT   Patient Name:   Eric Woods Date of Exam: 02/15/2022 Medical Rec #:  [id number]       Height:       72.0 in Accession #:    [id number]      Weight:       170.0 lb Date of Birth:  [birth year]       BSA:          1.988 m Patient Age:    45 years        BP:           134/93 mmHg Patient Gender: M               HR:           102 bpm. Exam Location:  Inpatient Procedure: 2D Echo Indications:    endocarditis  History:        Patient has no prior history of Echocardiogram examinations.  Sonographer:    [name] RDCS Referring Phys: [id number] Eric Woods IMPRESSIONS  1. Redundant MV chordae noted.  2. Left ventricular ejection fraction, by estimation, is 60 to 65%. The left ventricle has normal function. The left ventricle has no regional wall motion abnormalities. Left ventricular diastolic parameters were normal.  3. Right ventricular systolic function is normal. The right ventricular size is normal. Tricuspid regurgitation signal is inadequate for assessing PA pressure.  4. The mitral valve is normal in structure. No evidence of mitral valve regurgitation. No evidence of mitral stenosis.  5. The aortic valve is tricuspid. Aortic valve regurgitation is trivial. No aortic stenosis is present.  6. There is borderline dilatation of the aortic root, measuring 38 mm.  7. The inferior vena cava is normal in size with greater than 50% respiratory variability, suggesting right atrial pressure of 3 mmHg. FINDINGS  Left Ventricle: Left ventricular ejection fraction, by estimation, is 60 to 65%. The left ventricle has normal function. The left ventricle has no regional wall motion abnormalities. The left ventricular internal cavity size was normal in size. There is  no left ventricular hypertrophy. Left ventricular diastolic parameters were normal. Right Ventricle: The right ventricular size is normal. Right ventricular systolic function is normal. Tricuspid  regurgitation signal is inadequate for assessing PA pressure. The tricuspid regurgitant velocity is 2.22 m/s, and with an assumed right atrial  pressure of 3 mmHg, the estimated right ventricular systolic pressure is 22.7 mmHg. Left Atrium: Left atrial size was normal in size. Right Atrium: Right atrial size was normal in size. Pericardium: There is no evidence of pericardial effusion. Mitral Valve: The mitral valve is normal in structure. No evidence of mitral valve regurgitation. No evidence of mitral valve stenosis. Tricuspid Valve: The tricuspid valve is normal in structure. Tricuspid valve regurgitation is trivial. No evidence of tricuspid stenosis. Aortic Valve: The aortic valve is tricuspid. Aortic valve regurgitation is trivial. No aortic  stenosis is present. Pulmonic Valve: The pulmonic valve was normal in structure. Pulmonic valve regurgitation is not visualized. No evidence of pulmonic stenosis. Aorta: The aortic root is normal in size and structure. There is borderline dilatation of the aortic root, measuring 38 mm. Venous: The inferior vena cava is normal in size with greater than 50% respiratory variability, suggesting right atrial pressure of 3 mmHg. IAS/Shunts: No atrial level shunt detected by color flow Doppler. Additional Comments: Redundant MV chordae noted.  LEFT VENTRICLE PLAX 2D LVIDd:         5.00 cm   Diastology LVIDs:         3.20 cm   LV e' medial:    10.40 cm/s LV PW:         1.00 cm   LV E/e' medial:  8.4 LV IVS:        1.00 cm   LV e' lateral:   14.10 cm/s LVOT diam:     2.00 cm   LV E/e' lateral: 6.2 LV SV:         51 LV SV Index:   26 LVOT Area:     3.14 cm  RIGHT VENTRICLE             IVC RV S prime:     17.50 cm/s  IVC diam: 2.10 cm TAPSE (M-mode): 2.2 cm LEFT ATRIUM             Index        RIGHT ATRIUM           Index LA diam:        3.60 cm 1.81 cm/m   RA Area:     14.30 cm LA Vol (A2C):   45.7 ml 22.98 ml/m  RA Volume:   32.60 ml  16.40 ml/m LA Vol (A4C):   46.5 ml 23.39  ml/m LA Biplane Vol: 47.1 ml 23.69 ml/m  AORTIC VALVE LVOT Vmax:   113.00 cm/s LVOT Vmean:  73.900 cm/s LVOT VTI:    0.163 m  AORTA Ao Root diam: 3.80 cm Ao Asc diam:  3.30 cm MITRAL VALVE               TRICUSPID VALVE MV Area (PHT): 5.02 cm    TR Peak grad:   19.7 mmHg MV Decel Time: 151 msec    TR Vmax:        222.00 cm/s MV E velocity: 87.30 cm/s MV A velocity: 96.30 cm/s  SHUNTS MV E/A ratio:  0.91        Systemic VTI:  0.16 m                            Systemic Diam: 2.00 cm Kirk Ruths MD Electronically signed by Kirk Ruths MD Signature Date/Time: 02/15/2022/2:52:02 PM    Final    DG Knee Complete 4 Views Left  Result Date: 02/15/2022 CLINICAL DATA:  Left knee swelling and pain. EXAM: LEFT KNEE - COMPLETE 4+ VIEW COMPARISON:  None Available. FINDINGS: No evidence of fracture, dislocation, or joint effusion. No evidence of arthropathy or other focal bone abnormality. There is moderate broad-based anterior soft tissue swelling. IMPRESSION: Anterior soft tissue swelling without acute underlying bone abnormality or visible joint effusion. Electronically Signed   By: Telford Nab M.D.   On: 02/15/2022 05:56    EKG: not done   Labs on Admission: I have personally reviewed the available labs and imaging studies at the  time of the admission.  Pertinent labs:    Na++ 132 K+ 3.4 Glucose 126 Lactate 1.1 WBC 15.9   Assessment and Plan: Principal Problem:   Cellulitis Active Problems:   Polysubstance abuse (HCC)   Infection of skin due to methicillin resistant Staphylococcus aureus (MRSA)   Tobacco dependence    Cellulitis of L knee and L periorbital/forehead region, presumed MRSA -Patient with h/o MRSA infection in July -He is unsure whether he took abx post-hospitalization (doxy was prescribed) -He has compulsive skin picking d/o and has been picking again -Currently with apparent infection of forehead and L periorbital region as well as L knee -He was given Vanc in the ER, will  continue based on prior cultures -Admission in this patient is warranted due to:  -outpatient IV therapy is not appropriate because clinical presentation is judged to require immediate initiation of IV antibiotics and intensity of patient monitoring cannot be provided without inpatient level of care -severe periorbital infection -Will consult ortho for possible I&D of L knee -Pain control with low-dose oxy without escalation  -Will order echo to evaluate for endocarditis - may need TEE -Will need ID consult if blood cultures are positive  Polysubstance abuse -Patient has "graduated" from prior long-term substance abuse program but is using again post-prison release -Likely needs ongoing substance abuse support, recommended halfway house program -Cessation is encouraged -Will request TOC team consult  Tobacco dependence -Encourage cessation.     -Patch declined by patient      Advance Care Planning:   Code Status: Full Code   Consults: Orthopedics; TOC team  DVT Prophylaxis: Lovenox  Family Communication: Girlfriend was present throughout evaluation  Severity of Illness: The appropriate patient status for this patient is INPATIENT. Inpatient status is judged to be reasonable and necessary in order to provide the required intensity of service to ensure the patient's safety. The patient's presenting symptoms, physical exam findings, and initial radiographic and laboratory data in the context of their chronic comorbidities is felt to place them at high risk for further clinical deterioration. Furthermore, it is not anticipated that the patient will be medically stable for discharge from the hospital within 2 midnights of admission.   * I certify that at the point of admission it is my clinical judgment that the patient will require inpatient hospital care spanning beyond 2 midnights from the point of admission due to high intensity of service, high risk for further deterioration and  high frequency of surveillance required.*  Author: Karmen Bongo, MD 02/15/2022 6:41 PM  For on call review [email or web address].

## 2022-02-15 NOTE — ED Notes (Signed)
With the patient permission the mother would like an update asap

## 2022-02-15 NOTE — Consult Note (Signed)
Reason for Consult:Left knee septic bursitis Referring Physician: Jonah Blue Time called: [id number] Time at bedside: 0913   Eric Woods is an 46 y.o. male.  HPI: Eric Woods is well known to the orthopedic service. He suffers from frequent skin infections 2/2 his meth use. He comes in today with left knee pain and redness that's been going on about 2d. It's rapidly getting worse, enough so he doesn't think he can walk on it at this point. He also has a small lesion on his right hand that also cropped up recently and is painful. He squeezed that and got some clearish fluid out of it.  History reviewed. No pertinent past medical history.  Past Surgical History:  Procedure Laterality Date   ABDOMINAL SURGERY     HAND SURGERY     I & D EXTREMITY Right 12/19/2021   Procedure: IRRIGATION AND DEBRIDEMENT EXTREMITY;  Surgeon: Gomez Cleverly, MD;  Location: [facility] OR;  Service: Orthopedics;  Laterality: Right;    No family history on file.  Social History:  reports that he has been smoking cigarettes. He does not have any smokeless tobacco history on file. He reports current alcohol use. He reports current drug use. Drugs: Marijuana and Methamphetamines.  Allergies:  Allergies  Allergen Reactions   Penicillins Other (See Comments)    Childhood allergy    Medications: I have reviewed the patient's current medications.  Results for orders placed or performed during the hospital encounter of 02/14/22 (from the past 48 hour(s))  Lactic acid, plasma     Status: None   Collection Time: 02/15/22  4:58 AM  Result Value Ref Range   Lactic Acid, Venous 1.1 0.5 - 1.9 mmol/L    Comment: Performed at [facility], [address], [place], [place] [address]  Comprehensive metabolic panel     Status: Abnormal   Collection Time: 02/15/22  4:58 AM  Result Value Ref Range   Sodium 132 (L) 135 - 145 mmol/L   Potassium 3.4 (L) 3.5 - 5.1 mmol/L   Chloride 95 (L) 98 - 111 mmol/L   CO2 27 22 - 32 mmol/L    Glucose, Bld 126 (H) 70 - 99 mg/dL    Comment: Glucose reference range applies only to samples taken after fasting for at least 8 hours.   BUN 11 6 - 20 mg/dL   Creatinine, Ser 2.95 0.61 - 1.24 mg/dL   Calcium 9.4 8.9 - 18.8 mg/dL   Total Protein 7.3 6.5 - 8.1 g/dL   Albumin 3.7 3.5 - 5.0 g/dL   AST 19 15 - 41 U/L   ALT 18 0 - 44 U/L   Alkaline Phosphatase 64 38 - 126 U/L   Total Bilirubin 0.7 0.3 - 1.2 mg/dL   GFR, Estimated >41 >66 mL/min    Comment: (NOTE) Calculated using the CKD-EPI Creatinine Equation (2021)    Anion gap 10 5 - 15    Comment: Performed at [facility], [address], [place], [place] [address]  CBC with Differential     Status: Abnormal   Collection Time: 02/15/22  4:58 AM  Result Value Ref Range   WBC 15.9 (H) 4.0 - 10.5 K/uL   RBC 4.30 4.22 - 5.81 MIL/uL   Hemoglobin 14.4 13.0 - 17.0 g/dL   HCT 60.1 09.3 - 23.5 %   MCV 97.2 80.0 - 100.0 fL   MCH 33.5 26.0 - 34.0 pg   MCHC 34.4 30.0 - 36.0 g/dL   RDW 57.3 22.0 -  15.5 %   Platelets 312 150 - 400 K/uL   nRBC 0.0 0.0 - 0.2 %   Neutrophils Relative % 83 %   Neutro Abs 13.2 (H) 1.7 - 7.7 K/uL   Lymphocytes Relative 7 %   Lymphs Abs 1.1 0.7 - 4.0 K/uL   Monocytes Relative 8 %   Monocytes Absolute 1.3 (H) 0.1 - 1.0 K/uL   Eosinophils Relative 1 %   Eosinophils Absolute 0.1 0.0 - 0.5 K/uL   Basophils Relative 0 %   Basophils Absolute 0.1 0.0 - 0.1 K/uL   Immature Granulocytes 1 %   Abs Immature Granulocytes 0.08 (H) 0.00 - 0.07 K/uL    Comment: Performed at [facility] [address] [place], [place] [address]  Blood Culture (routine x 2)     Status: None (Preliminary result)   Collection Time: 02/15/22  4:58 AM   Specimen: BLOOD LEFT ARM  Result Value Ref Range   Specimen Description BLOOD LEFT ARM    Special Requests      BOTTLES DRAWN AEROBIC AND ANAEROBIC Blood Culture results may not be optimal due to an excessive volume of blood received in culture bottles   Culture      NO  GROWTH <12 HOURS Performed at [facility] [address], [place], [place] [address]    Report Status PENDING   Blood Culture (routine x 2)     Status: None (Preliminary result)   Collection Time: 02/15/22  4:58 AM   Specimen: BLOOD RIGHT FOREARM  Result Value Ref Range   Specimen Description BLOOD RIGHT FOREARM    Special Requests      BOTTLES DRAWN AEROBIC AND ANAEROBIC Blood Culture results may not be optimal due to an excessive volume of blood received in culture bottles   Culture      NO GROWTH <12 HOURS Performed at [facility], [place] [address], [place],  [address]    Report Status PENDING     DG Knee Complete 4 Views Left  Result Date: 02/15/2022 CLINICAL DATA:  Left knee swelling and pain. EXAM: LEFT KNEE - COMPLETE 4+ VIEW COMPARISON:  None Available. FINDINGS: No evidence of fracture, dislocation, or joint effusion. No evidence of arthropathy or other focal bone abnormality. There is moderate broad-based anterior soft tissue swelling. IMPRESSION: Anterior soft tissue swelling without acute underlying bone abnormality or visible joint effusion. Electronically Signed   By: Telford Nab M.D.   On: 02/15/2022 05:56    Review of Systems  HENT:  Negative for ear discharge, ear pain, hearing loss and tinnitus.   Eyes:  Negative for photophobia and pain.  Respiratory:  Negative for cough and shortness of breath.   Cardiovascular:  Negative for chest pain.  Gastrointestinal:  Negative for abdominal pain, nausea and vomiting.  Genitourinary:  Negative for dysuria, flank pain, frequency and urgency.  Musculoskeletal:  Positive for arthralgias (Left knee, right hand). Negative for back pain, myalgias and neck pain.  Neurological:  Negative for dizziness and headaches.  Hematological:  Does not bruise/bleed easily.  Psychiatric/Behavioral:  The patient is not nervous/anxious.    Blood pressure (!) 158/105, pulse (!) 110, temperature 98.9 F (37.2 C), resp. rate  18, height 6' (1.829 m), weight 77.1 kg, SpO2 100 %. Physical Exam Constitutional:      General: He is not in acute distress.    Appearance: He is well-developed. He is not diaphoretic.  HENT:     Head: Normocephalic and atraumatic.  Eyes:     General: No scleral icterus.       Right eye: No discharge.        Left eye: No discharge.     Conjunctiva/sclera: Conjunctivae normal.  Cardiovascular:     Rate and Rhythm: Normal rate and regular rhythm.  Pulmonary:     Effort: Pulmonary effort is normal. No respiratory distress.  Musculoskeletal:     Cervical back: Normal range of motion.     Comments: Right shoulder, elbow, wrist, digits- Nodular mass base of palm, mobile, mild TTP, surrounding ecchymosis, no instability, no blocks to motion  Sens  Ax/R/M/U intact  Mot   Ax/ R/ PIN/ M/ AIN/ U intact  Rad 2+  LLE No traumatic wounds, ecchymosis, or rash  Knee erythematous, edematous, fluctuant. PROM 180-135 painless.  No knee effusion  Sens DPN, SPN, TN intact  Motor EHL, ext, flex, evers 5/5  DP 2+, PT 2+, No significant edema  Skin:    General: Skin is warm and dry.  Neurological:     Mental Status: He is alert.  Psychiatric:        Mood and Affect: Mood normal.        Behavior: Behavior normal.     Assessment/Plan: Left knee septic bursitis -- Will I&D. Already admitted for IV abx. 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 Should be checked for IE. He should f/u with Dr. Yehuda Budd for further workup.    Freeman Caldron, PA-C Orthopedic Surgery [phone] 02/15/2022, 9:20 AM

## 2022-02-16 ENCOUNTER — Inpatient Hospital Stay (HOSPITAL_COMMUNITY): Payer: Self-pay

## 2022-02-16 DIAGNOSIS — L03116 Cellulitis of left lower limb: Secondary | ICD-10-CM

## 2022-02-16 DIAGNOSIS — B9562 Methicillin resistant Staphylococcus aureus infection as the cause of diseases classified elsewhere: Secondary | ICD-10-CM

## 2022-02-16 DIAGNOSIS — F1910 Other psychoactive substance abuse, uncomplicated: Secondary | ICD-10-CM

## 2022-02-16 DIAGNOSIS — L089 Local infection of the skin and subcutaneous tissue, unspecified: Secondary | ICD-10-CM

## 2022-02-16 DIAGNOSIS — R7881 Bacteremia: Secondary | ICD-10-CM

## 2022-02-16 DIAGNOSIS — F17210 Nicotine dependence, cigarettes, uncomplicated: Secondary | ICD-10-CM

## 2022-02-16 LAB — CBC
HCT: 32.9 % — ABNORMAL LOW (ref 39.0–52.0)
Hemoglobin: 11.4 g/dL — ABNORMAL LOW (ref 13.0–17.0)
MCH: 33.7 pg (ref 26.0–34.0)
MCHC: 34.7 g/dL (ref 30.0–36.0)
MCV: 97.3 fL (ref 80.0–100.0)
Platelets: 262 10*3/uL (ref 150–400)
RBC: 3.38 MIL/uL — ABNORMAL LOW (ref 4.22–5.81)
RDW: 11.9 % (ref 11.5–15.5)
WBC: 16.8 10*3/uL — ABNORMAL HIGH (ref 4.0–10.5)
nRBC: 0 % (ref 0.0–0.2)

## 2022-02-16 LAB — BASIC METABOLIC PANEL
Anion gap: 12 (ref 5–15)
BUN: 16 mg/dL (ref 6–20)
CO2: 22 mmol/L (ref 22–32)
Calcium: 8.5 mg/dL — ABNORMAL LOW (ref 8.9–10.3)
Chloride: 97 mmol/L — ABNORMAL LOW (ref 98–111)
Creatinine, Ser: 0.83 mg/dL (ref 0.61–1.24)
GFR, Estimated: >60 mL/min (ref >60)
Glucose, Bld: 92 mg/dL (ref 70–99)
Potassium: 3.9 mmol/L (ref 3.5–5.1)
Sodium: 131 mmol/L — ABNORMAL LOW (ref 135–145)

## 2022-02-16 MED ORDER — VANCOMYCIN HCL 1500 MG/300ML IV SOLN
1500.0000 mg | Freq: Two times a day (BID) | INTRAVENOUS | Status: AC
  Administered 2022-02-16 – 2022-02-19 (×6): 1500 mg via INTRAVENOUS
  Filled 2022-02-16 (×6): qty 300

## 2022-02-16 MED ORDER — GADOBUTROL 1 MMOL/ML IV SOLN
7.5000 mL | Freq: Once | INTRAVENOUS | Status: AC | PRN
Start: 2022-02-16 — End: 2022-02-16
  Administered 2022-02-16: 7.5 mL via INTRAVENOUS

## 2022-02-16 NOTE — Consult Note (Signed)
Date of Admission:  02/14/2022          Reason for Consult: MRSA bacteremia the abscess and multiple soft tissue infections due to skin picking   Referring Provider: [facility] auto consult and Hazeline Junker, MD   Assessment:  MRSA bacteremia in context of skin picking and multiple soft tissue infections including Knee abscess Rule out septic joint Large cellulitic area on forehead and involving his eyelids bilaterally Methamphetamine abuse On house arrest with ankle bracelet   Plan:  Continue vancomycin Repeat blood cultures tomorrow 2D echocardiogram and patient should have transesophageal echocardiogram later in the week If an MRI can be performed the left knee we should get 1 to exclude septic joint Continue to monitor his soft tissue infections for improvement and to monitor and for any evidence of deep infection in bone or joint We will rescreen for HIV and viral hepatitides  Principal Problem:   Cellulitis Active Problems:   Polysubstance abuse (HCC)   Infection of skin due to methicillin resistant Staphylococcus aureus (MRSA)   Tobacco dependence   Scheduled Meds:  docusate sodium  100 mg Oral BID   enoxaparin (LOVENOX) injection  40 mg Subcutaneous Q24H   sodium chloride flush  3 mL Intravenous Q12H   Continuous Infusions:  lactated ringers 75 mL/hr at 02/16/22 0046   vancomycin     PRN Meds:.acetaminophen **OR** acetaminophen, bisacodyl, hydrALAZINE, ondansetron **OR** ondansetron (ZOFRAN) IV, oxyCODONE, polyethylene glycol  HPI: Eric Woods is a 46 y.o. male with history of methamphetamine abuse and compulsive skin picking who has been seen multiple times by orthopedic surgery who had recently been released from prison for drug trafficking but has had relapse of his methamphetamine abuse and which leads to worsening of his skin picking.  He was picking at his skin on his knee and face recently and developed soft tissue infection of both to the point where  both of his eyelids were shot.  He was admitted to the hospitalist service in the ER and blood cultures were taken.  He was seen by orthopedic surgery who performed an I&D of a soft tissue abscess.  Blood cultures have subsequently come back positive for methicillin resistant Staphylococcus aureus.  We will continue vancomycin repeat blood cultures tomorrow 2D echocardiogram will be obtained.  I have ordered an MRI of the left knee but I do not know if it could be done with his ankle bracelet on.  We may have to do a CT with contrast instead.  I do not think he has any other areas where deep infection would be likely.  I spent 121 minutes with the patient including than 50% of the time in face to face counseling of the patient guarding his MRSA bacteremia methamphetamine abuse, soft tissue infections, personally reviewing films the knee, along with review of medical records in preparation for the visit and during the visit and in coordination of his care.    Review of Systems: Review of Systems  Constitutional:  Positive for fever. Negative for chills, malaise/fatigue and weight loss.  HENT:  Negative for congestion and sore throat.   Eyes:  Negative for blurred vision and photophobia.  Respiratory:  Negative for cough, shortness of breath and wheezing.   Cardiovascular:  Negative for chest pain, palpitations and leg swelling.  Gastrointestinal:  Negative for abdominal pain, blood in stool, constipation, diarrhea, heartburn, melena, nausea and vomiting.  Genitourinary:  Negative for dysuria, flank pain and hematuria.  Musculoskeletal:  Positive for myalgias.  Negative for back pain, falls and joint pain.  Skin:  Negative for itching and rash.  Neurological:  Negative for dizziness, focal weakness, loss of consciousness, weakness and headaches.  Endo/Heme/Allergies:  Does not bruise/bleed easily.  Psychiatric/Behavioral:  Positive for substance abuse. Negative for depression and suicidal  ideas. The patient does not have insomnia.     Past Medical History:  Diagnosis Date   Infection of skin due to methicillin resistant Staphylococcus aureus (MRSA)    Polysubstance abuse (HCC)    Tobacco dependence     Social History   Tobacco Use   Smoking status: Every Day    Types: Cigarettes  Substance Use Topics   Alcohol use: Not Currently    Comment: not his drug of choice   Drug use: Yes    Types: Marijuana, Methamphetamines    History reviewed. No pertinent family history. Allergies  Allergen Reactions   Penicillins Other (See Comments)    Childhood allergy    OBJECTIVE: Blood pressure 130/87, pulse 98, temperature 98.2 F (36.8 C), resp. rate 19, height 6' (1.829 m), weight 77.1 kg, SpO2 98 %.  Physical Exam Vitals reviewed.  Constitutional:      Appearance: He is well-developed.  HENT:     Head: Normocephalic and atraumatic.  Cardiovascular:     Rate and Rhythm: Normal rate and regular rhythm.     Heart sounds: No murmur heard.    No friction rub. No gallop.  Pulmonary:     Effort: Pulmonary effort is normal. No respiratory distress.     Breath sounds: Normal breath sounds. No stridor. No wheezing or rhonchi.  Abdominal:     General: There is no distension.     Palpations: Abdomen is soft.  Musculoskeletal:        General: No tenderness. Normal range of motion.     Cervical back: Neck supple.  Skin:    General: Skin is warm and dry.     Coloration: Skin is not pale.     Findings: Erythema present. No rash.  Neurological:     General: No focal deficit present.     Mental Status: He is alert and oriented to person, place, and time.  Psychiatric:        Mood and Affect: Mood normal.        Behavior: Behavior normal.        Thought Content: Thought content normal.        Judgment: Judgment normal.   Face with marked erythema involving forehead and eyelids bilateral left worse than right with left eyelid shut when he open the eyelid for me there  was no purulence seen over the cornea.  02/16/2022:    Left knee with bandage  Lab Results Lab Results  Component Value Date   WBC 16.8 (H) 02/16/2022   HGB 11.4 (L) 02/16/2022   HCT 32.9 (L) 02/16/2022   MCV 97.3 02/16/2022   PLT 262 02/16/2022    Lab Results  Component Value Date   CREATININE 0.83 02/16/2022   BUN 16 02/16/2022   NA 131 (L) 02/16/2022   K 3.9 02/16/2022   CL 97 (L) 02/16/2022   CO2 22 02/16/2022    Lab Results  Component Value Date   ALT 18 02/15/2022   AST 19 02/15/2022   ALKPHOS 64 02/15/2022   BILITOT 0.7 02/15/2022     Microbiology: Recent Results (from the past 240 hour(s))  Blood Culture (routine x 2)     Status: Abnormal (Preliminary result)  Collection Time: 02/15/22  4:58 AM   Specimen: BLOOD LEFT ARM  Result Value Ref Range Status   Specimen Description BLOOD LEFT ARM  Final   Special Requests   Final    BOTTLES DRAWN AEROBIC AND ANAEROBIC Blood Culture results may not be optimal due to an excessive volume of blood received in culture bottles   Culture  Setup Time   Final    GRAM POSITIVE COCCI IN CLUSTERS IN BOTH AEROBIC AND ANAEROBIC BOTTLES CRITICAL RESULT CALLED TO, READ BACK BY AND VERIFIED WITH: PHARMD FRANK ABBOTT ON 02/15/22 @ [id number] BY DRT    Culture (A)  Final    STAPHYLOCOCCUS AUREUS SUSCEPTIBILITIES TO FOLLOW Performed at [facility], [place] [address], [place], [place] [address]    Report Status PENDING  Incomplete  Blood Culture (routine x 2)     Status: Abnormal (Preliminary result)   Collection Time: 02/15/22  4:58 AM   Specimen: BLOOD RIGHT FOREARM  Result Value Ref Range Status   Specimen Description BLOOD RIGHT FOREARM  Final   Special Requests   Final    BOTTLES DRAWN AEROBIC AND ANAEROBIC Blood Culture results may not be optimal due to an excessive volume of blood received in culture bottles   Culture  Setup Time   Final    ANAEROBIC BOTTLE ONLY GRAM POSITIVE COCCI IN CLUSTERS CRITICAL VALUE NOTED.  VALUE  IS CONSISTENT WITH PREVIOUSLY REPORTED AND CALLED VALUE. Performed at [facility], [place] [address] [place], [place] [address]    Culture STAPHYLOCOCCUS AUREUS (A)  Final   Report Status PENDING  Incomplete  Blood Culture ID Panel (Reflexed)     Status: Abnormal   Collection Time: 02/15/22  4:58 AM  Result Value Ref Range Status   Enterococcus faecalis NOT DETECTED NOT DETECTED Final   Enterococcus Faecium NOT DETECTED NOT DETECTED Final   Listeria monocytogenes NOT DETECTED NOT DETECTED Final   Staphylococcus species DETECTED (A) NOT DETECTED Final    Comment: CRITICAL RESULT CALLED TO, READ BACK BY AND VERIFIED WITH: PHARMD FRANK ABBOTT ON 02/15/22 @ 2314 BY DRT    Staphylococcus aureus (BCID) DETECTED (A) NOT DETECTED Final    Comment: Methicillin (oxacillin)-resistant Staphylococcus aureus (MRSA). MRSA is predictably resistant to beta-lactam antibiotics (except ceftaroline). Preferred therapy is vancomycin unless clinically contraindicated. Patient requires contact precautions if  hospitalized. CRITICAL RESULT CALLED TO, READ BACK BY AND VERIFIED WITH: PHARMD FRANK ABBOTT ON 02/15/22 @ [id number] BY DRT    Staphylococcus epidermidis NOT DETECTED NOT DETECTED Final   Staphylococcus lugdunensis NOT DETECTED NOT DETECTED Final   Streptococcus species NOT DETECTED NOT DETECTED Final   Streptococcus agalactiae NOT DETECTED NOT DETECTED Final   Streptococcus pneumoniae NOT DETECTED NOT DETECTED Final   Streptococcus pyogenes NOT DETECTED NOT DETECTED Final   A.calcoaceticus-baumannii NOT DETECTED NOT DETECTED Final   Bacteroides fragilis NOT DETECTED NOT DETECTED Final   Enterobacterales NOT DETECTED NOT DETECTED Final   Enterobacter cloacae complex NOT DETECTED NOT DETECTED Final   Escherichia coli NOT DETECTED NOT DETECTED Final   Klebsiella aerogenes NOT DETECTED NOT DETECTED Final   Klebsiella oxytoca NOT DETECTED NOT DETECTED Final   Klebsiella pneumoniae NOT DETECTED NOT DETECTED  Final   Proteus species NOT DETECTED NOT DETECTED Final   Salmonella species NOT DETECTED NOT DETECTED Final   Serratia marcescens NOT DETECTED NOT DETECTED Final   Haemophilus influenzae NOT DETECTED NOT DETECTED Final   Neisseria meningitidis NOT DETECTED NOT DETECTED Final   Pseudomonas aeruginosa  NOT DETECTED NOT DETECTED Final   Stenotrophomonas maltophilia NOT DETECTED NOT DETECTED Final   Candida albicans NOT DETECTED NOT DETECTED Final   Candida auris NOT DETECTED NOT DETECTED Final   Candida glabrata NOT DETECTED NOT DETECTED Final   Candida krusei NOT DETECTED NOT DETECTED Final   Candida parapsilosis NOT DETECTED NOT DETECTED Final   Candida tropicalis NOT DETECTED NOT DETECTED Final   Cryptococcus neoformans/gattii NOT DETECTED NOT DETECTED Final   Meth resistant mecA/C and MREJ DETECTED (A) NOT DETECTED Final    Comment: CRITICAL RESULT CALLED TO, READ BACK BY AND VERIFIED WITH: PHARMD FRANK ABBOTT ON 02/15/22 @ 2314 BY DRT Performed at [facility], [place] [address], [place], [place] [address]   Aerobic Culture w Gram Stain (superficial specimen)     Status: None (Preliminary result)   Collection Time: 02/15/22 10:20 AM   Specimen: KNEE  Result Value Ref Range Status   Specimen Description KNEE  Final   Special Requests NONE  Final   Gram Stain NO WBC SEEN RARE GRAM POSITIVE COCCI   Final   Culture   Final    MODERATE STAPHYLOCOCCUS AUREUS SUSCEPTIBILITIES TO FOLLOW Performed at [facility], [place] [address] [place],  [address]    Report Status PENDING  Incomplete    Alcide Evener, [facility] [phone] pager  02/16/2022, 12:28 PM

## 2022-02-16 NOTE — Progress Notes (Signed)
Due to MRI contacted parole officer at [phone] to see about getting ankle monitor temporarily removed.  No answer left voice mail.  Parole office is closed on weekends.  Called [employer] office non emergent line [phone], left them with the charge nurse number here, [phone]. They said that they would get back with [place].

## 2022-02-16 NOTE — Progress Notes (Signed)
Progress Note  Patient: Eric Woods PTW:[id number] DOB: 09-17-1975  DOA: 02/14/2022  DOS: 02/16/2022    Brief hospital course: Eric Woods is a 46 y.o. male with a history of methamphetamine use, incarceration, skin picking behavior, recurrent MRSA infections who presented with left knee pain and several areas of skin and soft tissue infections. Broad IV antibiotics were started, narrowed to vancomycin and the patient was admitted. Orthopedics consulted, performed bedside left knee I&D 9/8. Blood cultures have grown MRSA. TTE without vegetation. Continues on vancomycin.  Assessment and Plan: Multifocal SSTI, MRSA bacteremia, left knee cellulitis:  - Continue vancomycin. Note previous hospitalization for MRSA infection and patient is "unsure" whether he took oral doxycycline after discharge.  - D/w ID, Dr. Daiva Eves. Recommendations appreciated. Will order left knee MRI (pending compatibility check with house arrest ankle monitor) for septic joint evaluation.  - Some lesions appear consistent with Osler nodes vs. Janeway lesions. No vegetation on TTE. Will request TEE next week.  - Continue oral analgesic only. - Repeat blood cultures ordered for tomorrow AM - HIV, hepatitis panel ordered per ID. Note preceding painful ulcer of penile shaft. ?H. ducreyi. Will add on RPR as well though this lesion is painful and likely bacterial. - Trend neutrophilic leukocytosis.   Left preseptal cellulitis: Favored over orbital cellulitis at this time due to minimal pain on EOM. D/w ophthalmology Dr. Allena Katz. Patient is being treated with adequate IV antibiotics as above. If this does not improve, or pain develops would scan to evaluate for abscess.   Polysubstance abuse: UDS +amphetamine, THC.  - Cessation counseling provided. Consider re-enrollment in treatment program  - TOC consulted  Tobacco use:  - Cessation counseling provided  Hyponatremia: Mild  Hypokalemia: Mild, resolved  Subjective: Pain at  multiple skin sites though worst on left knee. Tells me that pain is improved after I&D, worse with weight bearing but able to bear weight. Left eyelid swelling has returned. Feels like it's got a headache, but not severely painful, no worse with eye movements.   Objective: Vitals:   02/15/22 2038 02/15/22 2053 02/15/22 2100 02/16/22 0347  BP: (!) 137/105 (!) 144/89 (!) 144/89 130/87  Pulse: (!) 115 99 99 98  Resp: 18 18 18 19   Temp: (!) 102 F (38.9 C) 100 F (37.8 C) 100 F (37.8 C) 98.2 F (36.8 C)  TempSrc: Oral  Oral   SpO2: 100%  100% 98%  Weight:      Height:       Gen: `46 y.o. male in no distress Pulm: Nonlabored breathing room air. Clear CV: Regular rate and rhythm. No murmur, rub, or gallop. No JVD, no dependent edema. GI: Abdomen soft, non-tender, non-distended, with normoactive bowel sounds.  Ext: Warm, no deformities Skin: Widespread papular eruptions with excoriation, prominently on forehead, left eyelid swelling and modestly painful. Not significantly painful when I open his eyelid, no pain with EOM, no proptosis. As pictured in ID note. Also has less severe, more sparse erythematous papules involving right axilla/trunk, thenar surfaces bilaterally. Left knee in wrap, no proximal erythema.  Neuro: Alert and oriented. No focal neurological deficits. Psych: Judgement and insight appear fair. Mood euthymic & affect congruent. Behavior is appropriate.    Data Personally reviewed: CBC: Recent Labs  Lab 02/15/22 0458 02/16/22 0050  WBC 15.9* 16.8*  NEUTROABS 13.2*  --   HGB 14.4 11.4*  HCT 41.8 32.9*  MCV 97.2 97.3  PLT 312 262   Basic Metabolic Panel: Recent Labs  Lab 02/15/22 0458  02/16/22 0050  NA 132* 131*  K 3.4* 3.9  CL 95* 97*  CO2 27 22  GLUCOSE 126* 92  BUN 11 16  CREATININE 1.11 0.83  CALCIUM 9.4 8.5*   GFR: Estimated Creatinine Clearance: 122.6 mL/min (by C-G formula based on SCr of 0.83 mg/dL). Liver Function Tests: Recent Labs  Lab  02/15/22 0458  AST 19  ALT 18  ALKPHOS 64  BILITOT 0.7  PROT 7.3  ALBUMIN 3.7   No results for input(s): "LIPASE", "AMYLASE" in the last 168 hours. No results for input(s): "AMMONIA" in the last 168 hours. Coagulation Profile: No results for input(s): "INR", "PROTIME" in the last 168 hours. Cardiac Enzymes: No results for input(s): "CKTOTAL", "CKMB", "CKMBINDEX", "TROPONINI" in the last 168 hours. BNP (last 3 results) No results for input(s): "PROBNP" in the last 8760 hours. HbA1C: No results for input(s): "HGBA1C" in the last 72 hours. CBG: No results for input(s): "GLUCAP" in the last 168 hours. Lipid Profile: No results for input(s): "CHOL", "HDL", "LDLCALC", "TRIG", "CHOLHDL", "LDLDIRECT" in the last 72 hours. Thyroid Function Tests: No results for input(s): "TSH", "T4TOTAL", "FREET4", "T3FREE", "THYROIDAB" in the last 72 hours. Anemia Panel: No results for input(s): "VITAMINB12", "FOLATE", "FERRITIN", "TIBC", "IRON", "RETICCTPCT" in the last 72 hours. Urine analysis:    Component Value Date/Time   COLORURINE YELLOW 09/23/2014 0050   APPEARANCEUR CLEAR 09/23/2014 0050   LABSPEC 1.025 09/23/2014 0050   PHURINE 5.0 09/23/2014 0050   GLUCOSEU NEGATIVE 09/23/2014 0050   HGBUR TRACE (A) 09/23/2014 0050   BILIRUBINUR NEGATIVE 09/23/2014 0050   KETONESUR NEGATIVE 09/23/2014 0050   PROTEINUR TRACE (A) 09/23/2014 0050   UROBILINOGEN 0.2 09/23/2014 0050   NITRITE NEGATIVE 09/23/2014 0050   LEUKOCYTESUR NEGATIVE 09/23/2014 0050   Recent Results (from the past 240 hour(s))  Blood Culture (routine x 2)     Status: Abnormal (Preliminary result)   Collection Time: 02/15/22  4:58 AM   Specimen: BLOOD LEFT ARM  Result Value Ref Range Status   Specimen Description BLOOD LEFT ARM  Final   Special Requests   Final    BOTTLES DRAWN AEROBIC AND ANAEROBIC Blood Culture results may not be optimal due to an excessive volume of blood received in culture bottles   Culture  Setup Time    Final    GRAM POSITIVE COCCI IN CLUSTERS IN BOTH AEROBIC AND ANAEROBIC BOTTLES CRITICAL RESULT CALLED TO, READ BACK BY AND VERIFIED WITH: PHARMD FRANK ABBOTT ON 02/15/22 @ 2314 BY DRT    Culture (A)  Final    STAPHYLOCOCCUS AUREUS SUSCEPTIBILITIES TO FOLLOW Performed at [facility], [place] [address], [place], [place] [address]    Report Status PENDING  Incomplete  Blood Culture (routine x 2)     Status: Abnormal (Preliminary result)   Collection Time: 02/15/22  4:58 AM   Specimen: BLOOD RIGHT FOREARM  Result Value Ref Range Status   Specimen Description BLOOD RIGHT FOREARM  Final   Special Requests   Final    BOTTLES DRAWN AEROBIC AND ANAEROBIC Blood Culture results may not be optimal due to an excessive volume of blood received in culture bottles   Culture  Setup Time   Final    ANAEROBIC BOTTLE ONLY GRAM POSITIVE COCCI IN CLUSTERS CRITICAL VALUE NOTED.  VALUE IS CONSISTENT WITH PREVIOUSLY REPORTED AND CALLED VALUE. Performed at [facility], [place] [address], [place], [place] [address]    Culture STAPHYLOCOCCUS AUREUS (A)  Final   Report Status PENDING  Incomplete  Blood Culture ID Panel (Reflexed)     Status: Abnormal   Collection Time: 02/15/22  4:58 AM  Result Value Ref Range Status   Enterococcus faecalis NOT DETECTED NOT DETECTED Final   Enterococcus Faecium NOT DETECTED NOT DETECTED Final   Listeria monocytogenes NOT DETECTED NOT DETECTED Final   Staphylococcus species DETECTED (A) NOT DETECTED Final    Comment: CRITICAL RESULT CALLED TO, READ BACK BY AND VERIFIED WITH: PHARMD FRANK ABBOTT ON 02/15/22 @ 2314 BY DRT    Staphylococcus aureus (BCID) DETECTED (A) NOT DETECTED Final    Comment: Methicillin (oxacillin)-resistant Staphylococcus aureus (MRSA). MRSA is predictably resistant to beta-lactam antibiotics (except ceftaroline). Preferred therapy is vancomycin unless clinically contraindicated. Patient requires contact precautions if  hospitalized. CRITICAL RESULT  CALLED TO, READ BACK BY AND VERIFIED WITH: PHARMD FRANK ABBOTT ON 02/15/22 @ [id number] BY DRT    Staphylococcus epidermidis NOT DETECTED NOT DETECTED Final   Staphylococcus lugdunensis NOT DETECTED NOT DETECTED Final   Streptococcus species NOT DETECTED NOT DETECTED Final   Streptococcus agalactiae NOT DETECTED NOT DETECTED Final   Streptococcus pneumoniae NOT DETECTED NOT DETECTED Final   Streptococcus pyogenes NOT DETECTED NOT DETECTED Final   A.calcoaceticus-baumannii NOT DETECTED NOT DETECTED Final   Bacteroides fragilis NOT DETECTED NOT DETECTED Final   Enterobacterales NOT DETECTED NOT DETECTED Final   Enterobacter cloacae complex NOT DETECTED NOT DETECTED Final   Escherichia coli NOT DETECTED NOT DETECTED Final   Klebsiella aerogenes NOT DETECTED NOT DETECTED Final   Klebsiella oxytoca NOT DETECTED NOT DETECTED Final   Klebsiella pneumoniae NOT DETECTED NOT DETECTED Final   Proteus species NOT DETECTED NOT DETECTED Final   Salmonella species NOT DETECTED NOT DETECTED Final   Serratia marcescens NOT DETECTED NOT DETECTED Final   Haemophilus influenzae NOT DETECTED NOT DETECTED Final   Neisseria meningitidis NOT DETECTED NOT DETECTED Final   Pseudomonas aeruginosa NOT DETECTED NOT DETECTED Final   Stenotrophomonas maltophilia NOT DETECTED NOT DETECTED Final   Candida albicans NOT DETECTED NOT DETECTED Final   Candida auris NOT DETECTED NOT DETECTED Final   Candida glabrata NOT DETECTED NOT DETECTED Final   Candida krusei NOT DETECTED NOT DETECTED Final   Candida parapsilosis NOT DETECTED NOT DETECTED Final   Candida tropicalis NOT DETECTED NOT DETECTED Final   Cryptococcus neoformans/gattii NOT DETECTED NOT DETECTED Final   Meth resistant mecA/C and MREJ DETECTED (A) NOT DETECTED Final    Comment: CRITICAL RESULT CALLED TO, READ BACK BY AND VERIFIED WITH: PHARMD FRANK ABBOTT ON 02/15/22 @ [id number] BY DRT Performed at [facility], [address], [place], [place] [address]   Aerobic  Culture w Gram Stain (superficial specimen)     Status: None (Preliminary result)   Collection Time: 02/15/22 10:20 AM   Specimen: KNEE  Result Value Ref Range Status   Specimen Description KNEE  Final   Special Requests NONE  Final   Gram Stain NO WBC SEEN RARE GRAM POSITIVE COCCI   Final   Culture   Final    MODERATE STAPHYLOCOCCUS AUREUS SUSCEPTIBILITIES TO FOLLOW Performed at [facility], [place] [address] [place], [place] [address]    Report Status PENDING  Incomplete     ECHOCARDIOGRAM COMPLETE  Result Date: 02/15/2022    ECHOCARDIOGRAM REPORT   Patient Name:   Eric Woods Date of Exam: 02/15/2022 Medical Rec #:  [id number]       Height:       72.0 in Accession #:  [id number]      Weight:       170.0 lb Date of Birth:  28-Mar-1976       BSA:          1.988 m Patient Age:    45 years        BP:           134/93 mmHg Patient Gender: M               HR:           102 bpm. Exam Location:  Inpatient Procedure: 2D Echo Indications:    endocarditis  History:        Patient has no prior history of Echocardiogram examinations.  Sonographer:    [name] RDCS Referring Phys: [id number] JENNIFER YATES IMPRESSIONS  1. Redundant MV chordae noted.  2. Left ventricular ejection fraction, by estimation, is 60 to 65%. The left ventricle has normal function. The left ventricle has no regional wall motion abnormalities. Left ventricular diastolic parameters were normal.  3. Right ventricular systolic function is normal. The right ventricular size is normal. Tricuspid regurgitation signal is inadequate for assessing PA pressure.  4. The mitral valve is normal in structure. No evidence of mitral valve regurgitation. No evidence of mitral stenosis.  5. The aortic valve is tricuspid. Aortic valve regurgitation is trivial. No aortic stenosis is present.  6. There is borderline dilatation of the aortic root, measuring 38 mm.  7. The inferior vena cava is normal in size with greater than 50% respiratory  variability, suggesting right atrial pressure of 3 mmHg. FINDINGS  Left Ventricle: Left ventricular ejection fraction, by estimation, is 60 to 65%. The left ventricle has normal function. The left ventricle has no regional wall motion abnormalities. The left ventricular internal cavity size was normal in size. There is  no left ventricular hypertrophy. Left ventricular diastolic parameters were normal. Right Ventricle: The right ventricular size is normal. Right ventricular systolic function is normal. Tricuspid regurgitation signal is inadequate for assessing PA pressure. The tricuspid regurgitant velocity is 2.22 m/s, and with an assumed right atrial  pressure of 3 mmHg, the estimated right ventricular systolic pressure is 22.7 mmHg. Left Atrium: Left atrial size was normal in size. Right Atrium: Right atrial size was normal in size. Pericardium: There is no evidence of pericardial effusion. Mitral Valve: The mitral valve is normal in structure. No evidence of mitral valve regurgitation. No evidence of mitral valve stenosis. Tricuspid Valve: The tricuspid valve is normal in structure. Tricuspid valve regurgitation is trivial. No evidence of tricuspid stenosis. Aortic Valve: The aortic valve is tricuspid. Aortic valve regurgitation is trivial. No aortic stenosis is present. Pulmonic Valve: The pulmonic valve was normal in structure. Pulmonic valve regurgitation is not visualized. No evidence of pulmonic stenosis. Aorta: The aortic root is normal in size and structure. There is borderline dilatation of the aortic root, measuring 38 mm. Venous: The inferior vena cava is normal in size with greater than 50% respiratory variability, suggesting right atrial pressure of 3 mmHg. IAS/Shunts: No atrial level shunt detected by color flow Doppler. Additional Comments: Redundant MV chordae noted.  LEFT VENTRICLE PLAX 2D LVIDd:         5.00 cm   Diastology LVIDs:         3.20 cm   LV e' medial:    10.40 cm/s LV PW:         1.00  cm   LV E/e' medial:  8.4 LV IVS:  1.00 cm   LV e' lateral:   14.10 cm/s LVOT diam:     2.00 cm   LV E/e' lateral: 6.2 LV SV:         51 LV SV Index:   26 LVOT Area:     3.14 cm  RIGHT VENTRICLE             IVC RV S prime:     17.50 cm/s  IVC diam: 2.10 cm TAPSE (M-mode): 2.2 cm LEFT ATRIUM             Index        RIGHT ATRIUM           Index LA diam:        3.60 cm 1.81 cm/m   RA Area:     14.30 cm LA Vol (A2C):   45.7 ml 22.98 ml/m  RA Volume:   32.60 ml  16.40 ml/m LA Vol (A4C):   46.5 ml 23.39 ml/m LA Biplane Vol: 47.1 ml 23.69 ml/m  AORTIC VALVE LVOT Vmax:   113.00 cm/s LVOT Vmean:  73.900 cm/s LVOT VTI:    0.163 m  AORTA Ao Root diam: 3.80 cm Ao Asc diam:  3.30 cm MITRAL VALVE               TRICUSPID VALVE MV Area (PHT): 5.02 cm    TR Peak grad:   19.7 mmHg MV Decel Time: 151 msec    TR Vmax:        222.00 cm/s MV E velocity: 87.30 cm/s MV A velocity: 96.30 cm/s  SHUNTS MV E/A ratio:  0.91        Systemic VTI:  0.16 m                            Systemic Diam: 2.00 cm Kirk Ruths MD Electronically signed by Kirk Ruths MD Signature Date/Time: 02/15/2022/2:52:02 PM    Final    DG Knee Complete 4 Views Left  Result Date: 02/15/2022 CLINICAL DATA:  Left knee swelling and pain. EXAM: LEFT KNEE - COMPLETE 4+ VIEW COMPARISON:  None Available. FINDINGS: No evidence of fracture, dislocation, or joint effusion. No evidence of arthropathy or other focal bone abnormality. There is moderate broad-based anterior soft tissue swelling. IMPRESSION: Anterior soft tissue swelling without acute underlying bone abnormality or visible joint effusion. Electronically Signed   By: Telford Nab M.D.   On: 02/15/2022 05:56     Family Communication: Patient did not request that I speak to anyone.  Disposition: Status is: Inpatient Remains inpatient appropriate because: Continued IV antibiotics for MRSA bacteremia Planned Discharge Destination: Home  Patrecia Pour, MD 02/16/2022 2:50 PM Page by [email or web address]

## 2022-02-16 NOTE — Plan of Care (Signed)

## 2022-02-16 NOTE — Progress Notes (Signed)
Pharmacy Antibiotic Note  Eric Woods is a 46 y.o. male admitted on 02/14/2022 with cellulitis and now concern for MRSA bacteremia with positive BCID. S/p knee I&D on 9/9. Pharmacy has been consulted for vancomycin dosing.  Renal function improved some since presentation (Scr 1.11 > 0.83). WBC 16.8 and afebrile.   Plan: Increase dose of vancomycin to 1500 mg IV q12h (SCr 0.83, eAUC 506) F/u micro data, renal function and length of therapy  Height: 6' (182.9 cm) Weight: 77.1 kg (170 lb) IBW/kg (Calculated) : 77.6  Temp (24hrs), Avg:99.5 F (37.5 C), Min:98.1 F (36.7 C), Max:102 F (38.9 C)  Recent Labs  Lab 02/15/22 0458 02/16/22 0050  WBC 15.9* 16.8*  CREATININE 1.11 0.83  LATICACIDVEN 1.1  --     Estimated Creatinine Clearance: 122.6 mL/min (by C-G formula based on SCr of 0.83 mg/dL).    Allergies  Allergen Reactions   Penicillins Other (See Comments)    Childhood allergy    Antimicrobials this admission: vanc 9/8 >>   Microbiology: 9/8 Bcx: staph aureus, susceptibilities pending 9/8 knee cx: staph aureus, susceptibilities pending 9/8 BCID: MRSA   Thank you for allowing pharmacy to participate in this patient's care.  Marja Kays, PharmD PGY1 Acute Care Resident  02/16/2022,11:23 AM

## 2022-02-17 ENCOUNTER — Encounter (HOSPITAL_COMMUNITY)
Admission: EM | Disposition: A | Discharge status: Home / Self Care | Payer: Self-pay | Source: Home / Self Care | Attending: Internal Medicine

## 2022-02-17 ENCOUNTER — Inpatient Hospital Stay (HOSPITAL_COMMUNITY): Payer: Self-pay | Admitting: Anesthesiology

## 2022-02-17 ENCOUNTER — Other Ambulatory Visit: Payer: Self-pay

## 2022-02-17 ENCOUNTER — Encounter (HOSPITAL_COMMUNITY): Payer: Self-pay | Admitting: Internal Medicine

## 2022-02-17 DIAGNOSIS — F17210 Nicotine dependence, cigarettes, uncomplicated: Secondary | ICD-10-CM

## 2022-02-17 DIAGNOSIS — L03211 Cellulitis of face: Secondary | ICD-10-CM

## 2022-02-17 DIAGNOSIS — I088 Other rheumatic multiple valve diseases: Secondary | ICD-10-CM

## 2022-02-17 DIAGNOSIS — L02419 Cutaneous abscess of limb, unspecified: Secondary | ICD-10-CM

## 2022-02-17 HISTORY — PX: IRRIGATION AND DEBRIDEMENT KNEE: SHX5185

## 2022-02-17 LAB — CBC WITH DIFFERENTIAL/PLATELET
Abs Immature Granulocytes: 0.04 10*3/uL (ref 0.00–0.07)
Basophils Absolute: 0.1 10*3/uL (ref 0.0–0.1)
Basophils Relative: 1 %
Eosinophils Absolute: 0.4 10*3/uL (ref 0.0–0.5)
Eosinophils Relative: 3 %
HCT: 33.5 % — ABNORMAL LOW (ref 39.0–52.0)
Hemoglobin: 11.7 g/dL — ABNORMAL LOW (ref 13.0–17.0)
Immature Granulocytes: 0 %
Lymphocytes Relative: 13 %
Lymphs Abs: 1.7 10*3/uL (ref 0.7–4.0)
MCH: 33.3 pg (ref 26.0–34.0)
MCHC: 34.9 g/dL (ref 30.0–36.0)
MCV: 95.4 fL (ref 80.0–100.0)
Monocytes Absolute: 1.2 10*3/uL — ABNORMAL HIGH (ref 0.1–1.0)
Monocytes Relative: 9 %
Neutro Abs: 9.8 10*3/uL — ABNORMAL HIGH (ref 1.7–7.7)
Neutrophils Relative %: 74 %
Platelets: 277 10*3/uL (ref 150–400)
RBC: 3.51 MIL/uL — ABNORMAL LOW (ref 4.22–5.81)
RDW: 11.9 % (ref 11.5–15.5)
WBC: 13.3 10*3/uL — ABNORMAL HIGH (ref 4.0–10.5)
nRBC: 0 % (ref 0.0–0.2)

## 2022-02-17 LAB — URINALYSIS, ROUTINE W REFLEX MICROSCOPIC
Bacteria, UA: NONE SEEN
Bilirubin Urine: NEGATIVE
Glucose, UA: NEGATIVE mg/dL
Ketones, ur: NEGATIVE mg/dL
Leukocytes,Ua: NEGATIVE
Nitrite: NEGATIVE
Protein, ur: NEGATIVE mg/dL
Specific Gravity, Urine: 1.009 (ref 1.005–1.030)
pH: 6 (ref 5.0–8.0)

## 2022-02-17 LAB — HCV INTERPRETATION

## 2022-02-17 LAB — HEPATITIS B SURFACE ANTIBODY, QUANTITATIVE: Hep B S AB Quant (Post): 65.4 m[IU]/mL (ref >9.9)

## 2022-02-17 LAB — CULTURE, BLOOD (ROUTINE X 2)

## 2022-02-17 LAB — SURGICAL PCR SCREEN
MRSA, PCR: POSITIVE — AB
Staphylococcus aureus: POSITIVE — AB

## 2022-02-17 LAB — AEROBIC CULTURE W GRAM STAIN (SUPERFICIAL SPECIMEN): Gram Stain: NONE SEEN

## 2022-02-17 LAB — RPR: RPR Ser Ql: NONREACTIVE

## 2022-02-17 LAB — HCV AB W REFLEX TO QUANT PCR: HCV Ab: NONREACTIVE

## 2022-02-17 SURGERY — IRRIGATION AND DEBRIDEMENT KNEE
Anesthesia: General | Site: Knee | Laterality: Left

## 2022-02-17 SURGERY — IRRIGATION AND DEBRIDEMENT EXTREMITY
Anesthesia: General | Laterality: Left

## 2022-02-17 MED ORDER — LACTATED RINGERS IV SOLN: INTRAVENOUS | Status: DC

## 2022-02-17 MED ORDER — KETAMINE HCL 50 MG/5ML IJ SOSY
PREFILLED_SYRINGE | INTRAMUSCULAR | Status: AC
  Filled 2022-02-17: qty 5

## 2022-02-17 MED ORDER — ACETAMINOPHEN 325 MG PO TABS
650.0000 mg | ORAL_TABLET | Freq: Three times a day (TID) | ORAL | Status: AC
  Administered 2022-02-17 – 2022-02-21 (×12): 650 mg via ORAL
  Filled 2022-02-17 (×12): qty 2

## 2022-02-17 MED ORDER — FENTANYL CITRATE (PF) 100 MCG/2ML IJ SOLN
INTRAMUSCULAR | Status: AC
  Filled 2022-02-17: qty 2

## 2022-02-17 MED ORDER — DEXAMETHASONE SODIUM PHOSPHATE 10 MG/ML IJ SOLN
INTRAMUSCULAR | Status: AC
Start: 2022-02-17 — End: ?
  Filled 2022-02-17: qty 1

## 2022-02-17 MED ORDER — CHLORHEXIDINE GLUCONATE 0.12 % MT SOLN
OROMUCOSAL | Status: AC
  Administered 2022-02-17: 15 mL via OROMUCOSAL
  Filled 2022-02-17: qty 15

## 2022-02-17 MED ORDER — ONDANSETRON HCL 4 MG/2ML IJ SOLN
INTRAMUSCULAR | Status: AC
  Filled 2022-02-17: qty 2

## 2022-02-17 MED ORDER — BUPIVACAINE HCL 0.25 % IJ SOLN
INTRAMUSCULAR | Status: DC | PRN
  Administered 2022-02-17: 10 mL

## 2022-02-17 MED ORDER — METHOCARBAMOL 500 MG PO TABS
500.0000 mg | ORAL_TABLET | Freq: Three times a day (TID) | ORAL | Status: DC | PRN
  Administered 2022-02-18 – 2022-03-04 (×22): 500 mg via ORAL
  Filled 2022-02-17 (×22): qty 1

## 2022-02-17 MED ORDER — PROPOFOL 10 MG/ML IV BOLUS
INTRAVENOUS | Status: AC
  Filled 2022-02-17: qty 20

## 2022-02-17 MED ORDER — MIDAZOLAM HCL 2 MG/2ML IJ SOLN
INTRAMUSCULAR | Status: AC
  Filled 2022-02-17: qty 2

## 2022-02-17 MED ORDER — BUPIVACAINE HCL (PF) 0.25 % IJ SOLN
INTRAMUSCULAR | Status: AC
  Filled 2022-02-17: qty 10

## 2022-02-17 MED ORDER — KETAMINE HCL 10 MG/ML IJ SOLN
INTRAMUSCULAR | Status: DC | PRN
  Administered 2022-02-17: 15 mg via INTRAVENOUS
  Administered 2022-02-17: 10 mg via INTRAVENOUS
  Administered 2022-02-17: 25 mg via INTRAVENOUS

## 2022-02-17 MED ORDER — ONDANSETRON HCL 4 MG/2ML IJ SOLN
INTRAMUSCULAR | Status: DC | PRN
  Administered 2022-02-17: 4 mg via INTRAVENOUS

## 2022-02-17 MED ORDER — FENTANYL CITRATE (PF) 250 MCG/5ML IJ SOLN
INTRAMUSCULAR | Status: DC | PRN
  Administered 2022-02-17 (×2): 50 ug via INTRAVENOUS

## 2022-02-17 MED ORDER — SODIUM CHLORIDE 0.9 % IV SOLN: INTRAVENOUS | Status: DC

## 2022-02-17 MED ORDER — MIDAZOLAM HCL 2 MG/2ML IJ SOLN
INTRAMUSCULAR | Status: DC | PRN
  Administered 2022-02-17: 2 mg via INTRAVENOUS

## 2022-02-17 MED ORDER — LIDOCAINE 2% (20 MG/ML) 5 ML SYRINGE
INTRAMUSCULAR | Status: DC | PRN
  Administered 2022-02-17: 60 mg via INTRAVENOUS

## 2022-02-17 MED ORDER — CELECOXIB 100 MG PO CAPS
100.0000 mg | ORAL_CAPSULE | Freq: Two times a day (BID) | ORAL | Status: DC
  Administered 2022-02-17 – 2022-03-05 (×32): 100 mg via ORAL
  Filled 2022-02-17 (×33): qty 1

## 2022-02-17 MED ORDER — MUPIROCIN 2 % EX OINT
1.0000 | TOPICAL_OINTMENT | Freq: Two times a day (BID) | CUTANEOUS | Status: DC
  Administered 2022-02-17 – 2022-02-21 (×8): 1 via NASAL
  Filled 2022-02-17 (×2): qty 22

## 2022-02-17 MED ORDER — CHLORHEXIDINE GLUCONATE 0.12 % MT SOLN: 15.0000 mL | Freq: Once | OROMUCOSAL | Status: AC

## 2022-02-17 MED ORDER — DEXAMETHASONE SODIUM PHOSPHATE 10 MG/ML IJ SOLN
INTRAMUSCULAR | Status: DC | PRN
  Administered 2022-02-17: 5 mg via INTRAVENOUS

## 2022-02-17 MED ORDER — FENTANYL CITRATE (PF) 250 MCG/5ML IJ SOLN
INTRAMUSCULAR | Status: AC
  Filled 2022-02-17: qty 5

## 2022-02-17 MED ORDER — CHLORHEXIDINE GLUCONATE CLOTH 2 % EX PADS
6.0000 | MEDICATED_PAD | Freq: Every day | CUTANEOUS | Status: AC
  Administered 2022-02-19 – 2022-02-21 (×3): 6 via TOPICAL

## 2022-02-17 MED ORDER — FENTANYL CITRATE (PF) 100 MCG/2ML IJ SOLN
25.0000 ug | INTRAMUSCULAR | Status: DC | PRN
  Administered 2022-02-17: 50 ug via INTRAVENOUS

## 2022-02-17 MED ORDER — LIDOCAINE 2% (20 MG/ML) 5 ML SYRINGE
INTRAMUSCULAR | Status: AC
  Filled 2022-02-17: qty 5

## 2022-02-17 MED ORDER — PROPOFOL 10 MG/ML IV BOLUS
INTRAVENOUS | Status: DC | PRN
  Administered 2022-02-17: 200 mg via INTRAVENOUS

## 2022-02-17 MED ORDER — TOBRAMYCIN SULFATE 1.2 G IJ SOLR
INTRAMUSCULAR | Status: AC
  Filled 2022-02-17: qty 1.2

## 2022-02-17 MED ORDER — SODIUM CHLORIDE 0.9 % IR SOLN
Status: DC | PRN
  Administered 2022-02-17: 3000 mL

## 2022-02-17 MED ORDER — ORAL CARE MOUTH RINSE: 15.0000 mL | Freq: Once | OROMUCOSAL | Status: AC

## 2022-02-17 MED ORDER — VANCOMYCIN HCL 1000 MG IV SOLR
INTRAVENOUS | Status: AC
  Filled 2022-02-17: qty 20

## 2022-02-17 SURGICAL SUPPLY — 44 items
BAG COUNTER SPONGE SURGICOUNT (BAG) ×1 IMPLANT
BNDG ELASTIC 4X5.8 VLCR STR LF (GAUZE/BANDAGES/DRESSINGS) ×1 IMPLANT
BNDG ELASTIC 6X5.8 VLCR STR LF (GAUZE/BANDAGES/DRESSINGS) ×1 IMPLANT
BNDG GAUZE DERMACEA FLUFF 4 (GAUZE/BANDAGES/DRESSINGS) ×1 IMPLANT
CNTNR URN SCR LID CUP LEK RST (MISCELLANEOUS) IMPLANT
CONT SPEC 4OZ STRL OR WHT (MISCELLANEOUS) ×2
COVER SURGICAL LIGHT HANDLE (MISCELLANEOUS) ×1 IMPLANT
CUFF TOURN SGL LL 12 NO SLV (MISCELLANEOUS) IMPLANT
DRAPE U-SHAPE 47X51 STRL (DRAPES) IMPLANT
DURAPREP 26ML APPLICATOR (WOUND CARE) ×1 IMPLANT
ELECT REM PT RETURN 9FT ADLT (ELECTROSURGICAL) ×1
ELECTRODE REM PT RTRN 9FT ADLT (ELECTROSURGICAL) IMPLANT
GAUZE PACKING IODOFORM 1/4X15 (PACKING) IMPLANT
GAUZE PAD ABD 8X10 STRL (GAUZE/BANDAGES/DRESSINGS) ×1 IMPLANT
GAUZE SPONGE 4X4 12PLY STRL (GAUZE/BANDAGES/DRESSINGS) ×1 IMPLANT
GAUZE XEROFORM 1X8 LF (GAUZE/BANDAGES/DRESSINGS) ×1 IMPLANT
GAUZE XEROFORM 5X9 LF (GAUZE/BANDAGES/DRESSINGS) IMPLANT
GLOVE BIO SURGEON STRL SZ 6.5 (GLOVE) ×1 IMPLANT
GLOVE BIOGEL PI IND STRL 8 (GLOVE) ×1 IMPLANT
GLOVE ECLIPSE 8.0 STRL XLNG CF (GLOVE) ×2 IMPLANT
GLOVE INDICATOR 6.5 STRL GRN (GLOVE) ×1 IMPLANT
GOWN STRL REUS W/ TWL LRG LVL3 (GOWN DISPOSABLE) ×2 IMPLANT
GOWN STRL REUS W/ TWL XL LVL3 (GOWN DISPOSABLE) ×1 IMPLANT
GOWN STRL REUS W/TWL LRG LVL3 (GOWN DISPOSABLE) ×2
GOWN STRL REUS W/TWL XL LVL3 (GOWN DISPOSABLE) ×1
HANDPIECE INTERPULSE COAX TIP (DISPOSABLE) ×1
KIT BASIN OR (CUSTOM PROCEDURE TRAY) ×1 IMPLANT
KIT TURNOVER KIT B (KITS) ×1 IMPLANT
MANIFOLD NEPTUNE II (INSTRUMENTS) ×1 IMPLANT
NS IRRIG 1000ML POUR BTL (IV SOLUTION) ×1 IMPLANT
PACK ORTHO EXTREMITY (CUSTOM PROCEDURE TRAY) ×1 IMPLANT
PAD ABD 8X10 STRL (GAUZE/BANDAGES/DRESSINGS) IMPLANT
PAD ARMBOARD 7.5X6 YLW CONV (MISCELLANEOUS) ×2 IMPLANT
PAD CAST 4YDX4 CTTN HI CHSV (CAST SUPPLIES) ×1 IMPLANT
PADDING CAST COTTON 4X4 STRL (CAST SUPPLIES) ×1
PADDING CAST COTTON 6X4 STRL (CAST SUPPLIES) ×1 IMPLANT
SET HNDPC FAN SPRY TIP SCT (DISPOSABLE) IMPLANT
SUT ETHILON 2 0 FS 18 (SUTURE) ×1 IMPLANT
SYR CONTROL 10ML LL (SYRINGE) IMPLANT
TOWEL GREEN STERILE (TOWEL DISPOSABLE) ×1 IMPLANT
TOWEL GREEN STERILE FF (TOWEL DISPOSABLE) ×1 IMPLANT
TUBE CONNECTING 12X1/4 (SUCTIONS) ×1 IMPLANT
UNDERPAD 30X36 HEAVY ABSORB (UNDERPADS AND DIAPERS) ×1 IMPLANT
YANKAUER SUCT BULB TIP NO VENT (SUCTIONS) ×1 IMPLANT

## 2022-02-17 NOTE — Progress Notes (Signed)
   [facility] has been requested to perform a transesophageal echocardiogram on [facility] for bacteremia (IV drug use).  After careful review of history and examination, the risks and benefits of transesophageal echocardiogram have been explained including risks of esophageal damage, perforation (1:10,000 risk), bleeding, pharyngeal hematoma as well as other potential complications associated with conscious sedation including aspiration, arrhythmia, respiratory failure and death. Alternatives to treatment were discussed, questions were answered. Patient is willing to proceed.   Will make patient NPO at midnight in case he can be added to the schedule tomorrow.  Corrin Parker, PA-C 02/17/2022 4:39 PM

## 2022-02-17 NOTE — Progress Notes (Signed)
Progress Note  Patient: Eric Woods CWU:[id number] DOB: 1976-05-20  DOA: 02/14/2022  DOS: 02/17/2022    Brief hospital course: Eric Woods is a 46 y.o. male with a history of methamphetamine use, incarceration, skin picking behavior, recurrent MRSA infections who presented with left knee pain and several areas of skin and soft tissue infections. Broad IV antibiotics were started, narrowed to vancomycin and the patient was admitted. Orthopedics consulted, performed bedside left knee I&D 9/8. Blood cultures have grown MRSA. TTE without vegetation. Continues on vancomycin.  Assessment and Plan: Multifocal SSTI, MRSA bacteremia:  - Continue vancomycin. Note previous hospitalization for MRSA infection and patient is "unsure" whether he took oral doxycycline after discharge.  - Appreciate ID recommendations. Continuing vancomycin.   - Some lesions appear consistent with Osler nodes vs. Janeway lesions. No vegetation on TTE. Touched base with Dr. Mayford Knife who will facilitate TEE arrangement this week. - Continue oral analgesic only. - Repeat blood cultures 9/10 NGTD. - HIV NR July 2023, RPR NR, hepatitis panel pending.   - Trend neutrophilic leukocytosis. Improving.  Left knee cellulitis and abscess: Intraarticular signal more consistent with reactive change, not suspected to be joint involvement per orthopedics.  - Orthopedics plans washout in the OR. - Wound Cx also predictably grew MRSA.   Left preseptal cellulitis: Significantly improved. Favored over orbital cellulitis at this time due to minimal pain on EOM. D/w ophthalmology Dr. Allena Katz. Patient is being treated with adequate IV antibiotics as above. If this does not improve, or pain develops would scan to evaluate for abscess.   Polysubstance abuse: UDS +amphetamine, THC.  - Cessation counseling provided. Consider re-enrollment in treatment program  - TOC consulted  Tobacco use:  - Cessation counseling provided  Hyponatremia:  Mild  Hypokalemia: Mild, resolved  Urinary hesitancy:  - Check UA and bladder scan.   Subjective: Fever decreased but recurrent last night, knee pain is severe, slightly worse than before. Reports intermittent urinary hesitancy. May be associated with inebriation, but wanted to let me know.  Objective: Vitals:   02/16/22 1834 02/16/22 2000 02/17/22 0355 02/17/22 1024  BP: (!) 133/93 139/76 (!) 105/92 127/77  Pulse: (!) 111 (!) 102 84 69  Resp: 16 17 19 18   Temp: 99 F (37.2 C) (!) 100.6 F (38.1 C) 98.5 F (36.9 C) 98.3 F (36.8 C)  TempSrc:  Oral  Oral  SpO2: 100% 100%    Weight:      Height:      Gen: 47 y.o. male in no distress Pulm: Nonlabored breathing room air. Clear. CV: Regular rate and rhythm. No murmur, rub, or gallop. No JVD, no dependent edema. GI: Abdomen soft, non-tender, non-distended, with normoactive bowel sounds.  Ext: Warm, no deformities Skin: Eyelid swelling L > R is significantly improved. Forehead eruptions are stable, coalescing a bit without active discharge. Left knee very red and swollen. Examined at the time pictures were taken in orthopedics PN. No NEW rashes, lesions or ulcers on visualized skin. Neuro: Alert and oriented. No focal neurological deficits. Psych: Judgement and insight appear fair. Mood euthymic & affect congruent. Behavior is appropriate.    Data Personally reviewed: CBC: Recent Labs  Lab 02/15/22 0458 02/16/22 0050 02/17/22 0630  WBC 15.9* 16.8* 13.3*  NEUTROABS 13.2*  --  9.8*  HGB 14.4 11.4* 11.7*  HCT 41.8 32.9* 33.5*  MCV 97.2 97.3 95.4  PLT 312 262 277   Basic Metabolic Panel: Recent Labs  Lab 02/15/22 0458 02/16/22 0050  NA 132* 131*  K 3.4*  3.9  CL 95* 97*  CO2 27 22  GLUCOSE 126* 92  BUN 11 16  CREATININE 1.11 0.83  CALCIUM 9.4 8.5*   GFR: Estimated Creatinine Clearance: 122.6 mL/min (by C-G formula based on SCr of 0.83 mg/dL). Liver Function Tests: Recent Labs  Lab 02/15/22 0458  AST 19  ALT 18   ALKPHOS 64  BILITOT 0.7  PROT 7.3  ALBUMIN 3.7    Recent Results (from the past 240 hour(s))  Blood Culture (routine x 2)     Status: Abnormal   Collection Time: 02/15/22  4:58 AM   Specimen: BLOOD LEFT ARM  Result Value Ref Range Status   Specimen Description BLOOD LEFT ARM  Final   Special Requests   Final    BOTTLES DRAWN AEROBIC AND ANAEROBIC Blood Culture results may not be optimal due to an excessive volume of blood received in culture bottles   Culture  Setup Time   Final    GRAM POSITIVE COCCI IN CLUSTERS IN BOTH AEROBIC AND ANAEROBIC BOTTLES CRITICAL RESULT CALLED TO, READ BACK BY AND VERIFIED WITH: PHARMD FRANK ABBOTT ON 02/15/22 @ 2314 BY DRT Performed at [facility], [address], [place], [place] [address]    Culture METHICILLIN RESISTANT STAPHYLOCOCCUS AUREUS (A)  Final   Report Status 02/17/2022 FINAL  Final   Organism ID, Bacteria METHICILLIN RESISTANT STAPHYLOCOCCUS AUREUS  Final      Susceptibility   Methicillin resistant staphylococcus aureus - MIC*    CIPROFLOXACIN >=8 RESISTANT Resistant     ERYTHROMYCIN >=8 RESISTANT Resistant     GENTAMICIN <=0.5 SENSITIVE Sensitive     OXACILLIN >=4 RESISTANT Resistant     TETRACYCLINE <=1 SENSITIVE Sensitive     VANCOMYCIN 1 SENSITIVE Sensitive     TRIMETH/SULFA >=320 RESISTANT Resistant     CLINDAMYCIN >=8 RESISTANT Resistant     RIFAMPIN <=0.5 SENSITIVE Sensitive     Inducible Clindamycin NEGATIVE Sensitive     * METHICILLIN RESISTANT STAPHYLOCOCCUS AUREUS  Blood Culture (routine x 2)     Status: Abnormal   Collection Time: 02/15/22  4:58 AM   Specimen: BLOOD RIGHT FOREARM  Result Value Ref Range Status   Specimen Description BLOOD RIGHT FOREARM  Final   Special Requests   Final    BOTTLES DRAWN AEROBIC AND ANAEROBIC Blood Culture results may not be optimal due to an excessive volume of blood received in culture bottles   Culture  Setup Time   Final    ANAEROBIC BOTTLE ONLY GRAM POSITIVE COCCI IN  CLUSTERS CRITICAL VALUE NOTED.  VALUE IS CONSISTENT WITH PREVIOUSLY REPORTED AND CALLED VALUE.    Culture (A)  Final    STAPHYLOCOCCUS AUREUS SUSCEPTIBILITIES PERFORMED ON PREVIOUS CULTURE WITHIN THE LAST 5 DAYS. Performed at [facility], [address], [place], [place] [address]    Report Status 02/17/2022 FINAL  Final  Blood Culture ID Panel (Reflexed)     Status: Abnormal   Collection Time: 02/15/22  4:58 AM  Result Value Ref Range Status   Enterococcus faecalis NOT DETECTED NOT DETECTED Final   Enterococcus Faecium NOT DETECTED NOT DETECTED Final   Listeria monocytogenes NOT DETECTED NOT DETECTED Final   Staphylococcus species DETECTED (A) NOT DETECTED Final    Comment: CRITICAL RESULT CALLED TO, READ BACK BY AND VERIFIED WITH: PHARMD FRANK ABBOTT ON 02/15/22 @ 2314 BY DRT    Staphylococcus aureus (BCID) DETECTED (A) NOT DETECTED Final    Comment: Methicillin (oxacillin)-resistant Staphylococcus aureus (MRSA). MRSA is  predictably resistant to beta-lactam antibiotics (except ceftaroline). Preferred therapy is vancomycin unless clinically contraindicated. Patient requires contact precautions if  hospitalized. CRITICAL RESULT CALLED TO, READ BACK BY AND VERIFIED WITH: PHARMD FRANK ABBOTT ON 02/15/22 @ 2314 BY DRT    Staphylococcus epidermidis NOT DETECTED NOT DETECTED Final   Staphylococcus lugdunensis NOT DETECTED NOT DETECTED Final   Streptococcus species NOT DETECTED NOT DETECTED Final   Streptococcus agalactiae NOT DETECTED NOT DETECTED Final   Streptococcus pneumoniae NOT DETECTED NOT DETECTED Final   Streptococcus pyogenes NOT DETECTED NOT DETECTED Final   A.calcoaceticus-baumannii NOT DETECTED NOT DETECTED Final   Bacteroides fragilis NOT DETECTED NOT DETECTED Final   Enterobacterales NOT DETECTED NOT DETECTED Final   Enterobacter cloacae complex NOT DETECTED NOT DETECTED Final   Escherichia coli NOT DETECTED NOT DETECTED Final   Klebsiella aerogenes NOT DETECTED NOT  DETECTED Final   Klebsiella oxytoca NOT DETECTED NOT DETECTED Final   Klebsiella pneumoniae NOT DETECTED NOT DETECTED Final   Proteus species NOT DETECTED NOT DETECTED Final   Salmonella species NOT DETECTED NOT DETECTED Final   Serratia marcescens NOT DETECTED NOT DETECTED Final   Haemophilus influenzae NOT DETECTED NOT DETECTED Final   Neisseria meningitidis NOT DETECTED NOT DETECTED Final   Pseudomonas aeruginosa NOT DETECTED NOT DETECTED Final   Stenotrophomonas maltophilia NOT DETECTED NOT DETECTED Final   Candida albicans NOT DETECTED NOT DETECTED Final   Candida auris NOT DETECTED NOT DETECTED Final   Candida glabrata NOT DETECTED NOT DETECTED Final   Candida krusei NOT DETECTED NOT DETECTED Final   Candida parapsilosis NOT DETECTED NOT DETECTED Final   Candida tropicalis NOT DETECTED NOT DETECTED Final   Cryptococcus neoformans/gattii NOT DETECTED NOT DETECTED Final   Meth resistant mecA/C and MREJ DETECTED (A) NOT DETECTED Final    Comment: CRITICAL RESULT CALLED TO, READ BACK BY AND VERIFIED WITH: PHARMD FRANK ABBOTT ON 02/15/22 @ 2314 BY DRT Performed at [facility], [address] [place], [place] [address]   Aerobic Culture w Gram Stain (superficial specimen)     Status: None   Collection Time: 02/15/22 10:20 AM   Specimen: KNEE  Result Value Ref Range Status   Specimen Description KNEE  Final   Special Requests NONE  Final   Gram Stain   Final    NO WBC SEEN RARE GRAM POSITIVE COCCI Performed at [facility], [address], [place], [place] [address]    Culture   Final    MODERATE METHICILLIN RESISTANT STAPHYLOCOCCUS AUREUS   Report Status 02/17/2022 FINAL  Final   Organism ID, Bacteria METHICILLIN RESISTANT STAPHYLOCOCCUS AUREUS  Final      Susceptibility   Methicillin resistant staphylococcus aureus - MIC*    CIPROFLOXACIN >=8 RESISTANT Resistant     ERYTHROMYCIN >=8 RESISTANT Resistant     GENTAMICIN <=0.5 SENSITIVE Sensitive     OXACILLIN >=4  RESISTANT Resistant     TETRACYCLINE <=1 SENSITIVE Sensitive     VANCOMYCIN 1 SENSITIVE Sensitive     TRIMETH/SULFA >=320 RESISTANT Resistant     CLINDAMYCIN >=8 RESISTANT Resistant     RIFAMPIN <=0.5 SENSITIVE Sensitive     Inducible Clindamycin NEGATIVE Sensitive     * MODERATE METHICILLIN RESISTANT STAPHYLOCOCCUS AUREUS     MR KNEE LEFT W WO CONTRAST  Result Date: 02/17/2022 CLINICAL DATA:  Knee redness, pain, swelling. Septic arthritis suspected, knee, xray done EXAM: MRI OF THE LEFT KNEE WITHOUT AND WITH CONTRAST TECHNIQUE: Multiplanar, multisequence MR imaging of the left  knee was performed both before and after administration of intravenous contrast. CONTRAST:  7.59mL GADAVIST GADOBUTROL 1 MMOL/ML IV SOLN COMPARISON:  X-ray 02/15/2022 FINDINGS: MENISCI Medial meniscus:  Intact. Lateral meniscus:  Intact. LIGAMENTS Cruciates: Intact ACL and PCL. Collaterals: Intact MCL. Lateral collateral ligament complex intact. CARTILAGE Patellofemoral: Minimal cartilage surface irregularity along the patellar apex inferiorly. No trochlear cartilage defect. Medial:  No chondral defect. Lateral:  No chondral defect. MISCELLANEOUS Joint: Small joint effusion without synovitis. Fat pads within normal limits. Popliteal Fossa:  No Baker's cyst. Intact popliteus tendon. Extensor Mechanism:  Intact quadriceps and patellar tendons. Bones: No acute fracture. No dislocation. No bone marrow edema. No periostitis. No erosion. No marrow replacing bone lesion. Other: Prepatellar soft tissue wound with underlying complex fluid collection with partial rim enhancement. Approximate measurements of 6.0 x 1.0 x 3.0 cm (series 19, image 14). Extensive subcutaneous edema and enhancement throughout the soft tissues of the lower extremity centered at the knee anteriorly. There is deep fascial edema and enhancement. Mild intramuscular edema within the superficial aspects of the distal vastus lateralis and vastus medialis muscles.  IMPRESSION: 1. Prepatellar soft tissue wound with underlying complex fluid collection with partial rim enhancement measuring approximately 6 x 1 x 3 cm compatible with phlegmon/developing abscess. 2. Extensive findings of cellulitis and myofasciitis of the imaged lower extremity. 3. Small joint effusion without synovitis, favored reactive. Septic arthritis is not entirely excluded and arthrocentesis with joint fluid analysis should be considered. 4. No evidence of osteomyelitis. 5. Intact menisci.  Intact cruciate and collateral ligaments. Electronically Signed   By: Duanne Guess D.O.   On: 02/17/2022 09:09   ECHOCARDIOGRAM COMPLETE  Result Date: 02/15/2022    ECHOCARDIOGRAM REPORT   Patient Name:   Eric Woods Date of Exam: 02/15/2022 Medical Rec #:  [id number]       Height:       72.0 in Accession #:    [id number]      Weight:       170.0 lb Date of Birth:  17-Nov-1975       BSA:          1.988 m Patient Age:    45 years        BP:           134/93 mmHg Patient Gender: M               HR:           102 bpm. Exam Location:  Inpatient Procedure: 2D Echo Indications:    endocarditis  History:        Patient has no prior history of Echocardiogram examinations.  Sonographer:    [name] RDCS Referring Phys: [id number] JENNIFER YATES IMPRESSIONS  1. Redundant MV chordae noted.  2. Left ventricular ejection fraction, by estimation, is 60 to 65%. The left ventricle has normal function. The left ventricle has no regional wall motion abnormalities. Left ventricular diastolic parameters were normal.  3. Right ventricular systolic function is normal. The right ventricular size is normal. Tricuspid regurgitation signal is inadequate for assessing PA pressure.  4. The mitral valve is normal in structure. No evidence of mitral valve regurgitation. No evidence of mitral stenosis.  5. The aortic valve is tricuspid. Aortic valve regurgitation is trivial. No aortic stenosis is present.  6. There is borderline dilatation of  the aortic root, measuring 38 mm.  7. The inferior vena cava is normal in size with greater than 50% respiratory variability, suggesting right atrial  pressure of 3 mmHg. FINDINGS  Left Ventricle: Left ventricular ejection fraction, by estimation, is 60 to 65%. The left ventricle has normal function. The left ventricle has no regional wall motion abnormalities. The left ventricular internal cavity size was normal in size. There is  no left ventricular hypertrophy. Left ventricular diastolic parameters were normal. Right Ventricle: The right ventricular size is normal. Right ventricular systolic function is normal. Tricuspid regurgitation signal is inadequate for assessing PA pressure. The tricuspid regurgitant velocity is 2.22 m/s, and with an assumed right atrial  pressure of 3 mmHg, the estimated right ventricular systolic pressure is 22.7 mmHg. Left Atrium: Left atrial size was normal in size. Right Atrium: Right atrial size was normal in size. Pericardium: There is no evidence of pericardial effusion. Mitral Valve: The mitral valve is normal in structure. No evidence of mitral valve regurgitation. No evidence of mitral valve stenosis. Tricuspid Valve: The tricuspid valve is normal in structure. Tricuspid valve regurgitation is trivial. No evidence of tricuspid stenosis. Aortic Valve: The aortic valve is tricuspid. Aortic valve regurgitation is trivial. No aortic stenosis is present. Pulmonic Valve: The pulmonic valve was normal in structure. Pulmonic valve regurgitation is not visualized. No evidence of pulmonic stenosis. Aorta: The aortic root is normal in size and structure. There is borderline dilatation of the aortic root, measuring 38 mm. Venous: The inferior vena cava is normal in size with greater than 50% respiratory variability, suggesting right atrial pressure of 3 mmHg. IAS/Shunts: No atrial level shunt detected by color flow Doppler. Additional Comments: Redundant MV chordae noted.  LEFT VENTRICLE  PLAX 2D LVIDd:         5.00 cm   Diastology LVIDs:         3.20 cm   LV e' medial:    10.40 cm/s LV PW:         1.00 cm   LV E/e' medial:  8.4 LV IVS:        1.00 cm   LV e' lateral:   14.10 cm/s LVOT diam:     2.00 cm   LV E/e' lateral: 6.2 LV SV:         51 LV SV Index:   26 LVOT Area:     3.14 cm  RIGHT VENTRICLE             IVC RV S prime:     17.50 cm/s  IVC diam: 2.10 cm TAPSE (M-mode): 2.2 cm LEFT ATRIUM             Index        RIGHT ATRIUM           Index LA diam:        3.60 cm 1.81 cm/m   RA Area:     14.30 cm LA Vol (A2C):   45.7 ml 22.98 ml/m  RA Volume:   32.60 ml  16.40 ml/m LA Vol (A4C):   46.5 ml 23.39 ml/m LA Biplane Vol: 47.1 ml 23.69 ml/m  AORTIC VALVE LVOT Vmax:   113.00 cm/s LVOT Vmean:  73.900 cm/s LVOT VTI:    0.163 m  AORTA Ao Root diam: 3.80 cm Ao Asc diam:  3.30 cm MITRAL VALVE               TRICUSPID VALVE MV Area (PHT): 5.02 cm    TR Peak grad:   19.7 mmHg MV Decel Time: 151 msec    TR Vmax:        222.00 cm/s  MV E velocity: 87.30 cm/s MV A velocity: 96.30 cm/s  SHUNTS MV E/A ratio:  0.91        Systemic VTI:  0.16 m                            Systemic Diam: 2.00 cm Olga Millers MD Electronically signed by Olga Millers MD Signature Date/Time: 02/15/2022/2:52:02 PM    Final      Family Communication: Pt's mom reportedly going to be present for rounds tomorrow. I also called her today with patient's consent, no answer.  Disposition: Status is: Inpatient Remains inpatient appropriate because: Continued IV antibiotics for MRSA bacteremia Planned Discharge Destination: Home  Tyrone Nine, MD 02/17/2022 12:46 PM Page by [email or web address]

## 2022-02-17 NOTE — Anesthesia Preprocedure Evaluation (Signed)
Anesthesia Evaluation  Patient identified by MRN, date of birth, ID band Patient awake    Airway Mallampati: II  TM Distance: >3 FB Neck ROM: Full    Dental no notable dental hx.    Pulmonary neg pulmonary ROS, Current Smoker,    Pulmonary exam normal        Cardiovascular negative cardio ROS   Rhythm:Regular Rate:Normal     Neuro/Psych negative neurological ROS  negative psych ROS   GI/Hepatic negative GI ROS, (+)     substance abuse  ,   Endo/Other  negative endocrine ROS  Renal/GU negative Renal ROS  negative genitourinary   Musculoskeletal Left knee infection   Abdominal Normal abdominal exam  (+)   Peds  Hematology negative hematology ROS (+)   Anesthesia Other Findings   Reproductive/Obstetrics                             Anesthesia Physical Anesthesia Plan  ASA: 2  Anesthesia Plan: General   Post-op Pain Management:    Induction: Intravenous  PONV Risk Score and Plan: 1 and Ondansetron, Dexamethasone, Midazolam and Treatment may vary due to age or medical condition  Airway Management Planned: Mask and LMA  Additional Equipment: None  Intra-op Plan:   Post-operative Plan: Extubation in OR  Informed Consent: I have reviewed the patients History and Physical, chart, labs and discussed the procedure including the risks, benefits and alternatives for the proposed anesthesia with the patient or authorized representative who has indicated his/her understanding and acceptance.     Dental advisory given  Plan Discussed with: CRNA  Anesthesia Plan Comments:         Anesthesia Quick Evaluation

## 2022-02-17 NOTE — Anesthesia Postprocedure Evaluation (Signed)
Anesthesia Post Note  Patient: [name]  Procedure(s) Performed: IRRIGATION AND DEBRIDEMENT KNEE (Left: Knee)     Patient location during evaluation: PACU Anesthesia Type: General Level of consciousness: awake and alert Pain management: pain level controlled Vital Signs Assessment: post-procedure vital signs reviewed and stable Respiratory status: spontaneous breathing, nonlabored ventilation, respiratory function stable and patient connected to nasal cannula oxygen Cardiovascular status: blood pressure returned to baseline and stable Postop Assessment: no apparent nausea or vomiting Anesthetic complications: no   No notable events documented.  Last Vitals:  Vitals:   02/17/22 1930 02/17/22 2005  BP: (!) 135/104 130/86  Pulse: 67 84  Resp: 14 14  Temp:  37.7 C  SpO2: 100% 100%    Last Pain:  Vitals:   02/17/22 2005  TempSrc: Oral  PainSc: 0-No pain                 Earl Lites P Normajean Nash

## 2022-02-17 NOTE — Interval H&P Note (Signed)
Unfortunately, redness is not getting better and knee. Well, the MRI reports a fluid collection that seems more likely to be the packing from the patients previous superficial incision debridement. We feel that a surgical debridement may help him and avoid sealing the joint. The patient demonstrates a 60 arc of motion currently and has no concern for septic joint. Risks and benefits were discussed.

## 2022-02-17 NOTE — Progress Notes (Signed)
   ORTHOPAEDIC PROGRESS NOTE  s/p Left knee I&D performed at bedside for left knee septic bursitis  SUBJECTIVE: Patient continues to have pain in the left knee. He states it is not improving since the bedside I&D. He has been on IV antibiotics. He is able to bear weight but it continues to hurt. He states his knee feels stiff and tight.   OBJECTIVE: PE: Left lower extremity: Significant erythema surrounding I&D incision. Fluctuance noted on the lateral aspect of the incision. He is tender to palpation throughout the left knee. Cellulitis of the left lower extremity. Significant edema of left lower leg compared to the contralateral side. No knee effusion. He is able to SLR without assistance. ROM from 5-approximately 45 degrees. This is limited due to "tightness" per patient. He is able to weight bear on the left leg. Distal motor and sensory function are intact.         Vitals:   02/16/22 2000 02/17/22 0355  BP: 139/76 (!) 105/92  Pulse: (!) 102 84  Resp: 17 19  Temp: (!) 100.6 F (38.1 C) 98.5 F (36.9 C)  SpO2: 100%     ASSESSMENT: Eric Woods is a 46 y.o. male prepatellar bursitis in a patient with a history of methamphetamine use, incarceration, skin picking behavior, recurrent MRSA infections   IMAGING:  MRI of the left knee demonstrates no signs of septic joint. He has signs of reactive synovitis. Complex fluid collection noted on MRI could be consistent with packing from previous I&D. Extensive cellulitis of lower extremity.   PLAN: At this point, patient symptoms are worsening. He continues to have significant pain and has not had any improvement after bedside I&D and IV antibiotics. He will require washout in the operating room. We tried to proceed with this at a reasonable time today, however the patient had already eaten this morning. His surgery will have to be delayed until this evening. Keep NPO for now. It may even get pushed back until tomorrow depending on  surgical availability tonight.   The risks benefits and alternatives were discussed with the patient including but not limited to the risks of nonoperative treatment, versus surgical intervention including continued infection, bleeding, nerve injury, the need for additional surgery, gait change, blood clots, cardiopulmonary complications, morbidity, mortality, among others, and they were willing to proceed.    - Plan: Washout in the OR later today. Delayed as patient has already eaten this morning.  - KEEP NPO - Weight Bearing Status/Activity: WBAT - VTE Prophylaxis: SCDs for now - Pain control: PRN pain medications - Contact information: Dr. Ramond Marrow, Alfonse Alpers, PA-C. After hours and holidays please check [email or web address] for group call information for [facility]   Alfonse Alpers, PA-C 02/17/2022

## 2022-02-17 NOTE — Progress Notes (Signed)
Subjective:  Left knee still hurting  Eyelid  edema is improved bilaterally   Antibiotics:  Anti-infectives (From admission, onward)    Start     Dose/Rate Route Frequency Ordered Stop   02/16/22 2200  vancomycin (VANCOREADY) IVPB 1500 mg/300 mL        1,500 mg 150 mL/hr over 120 Minutes Intravenous Every 12 hours 02/16/22 1123     02/15/22 1730  vancomycin (VANCOCIN) IVPB 1000 mg/200 mL premix  Status:  Discontinued        1,000 mg 200 mL/hr over 60 Minutes Intravenous Every 12 hours 02/15/22 0658 02/15/22 0658   02/15/22 1730  vancomycin (VANCOCIN) IVPB 1000 mg/200 mL premix  Status:  Discontinued        1,000 mg 200 mL/hr over 60 Minutes Intravenous Every 12 hours 02/15/22 0658 02/16/22 1123   02/15/22 0445  vancomycin (VANCOREADY) IVPB 1500 mg/300 mL        1,500 mg 150 mL/hr over 120 Minutes Intravenous  Once 02/15/22 0442 02/15/22 0836       Medications: Scheduled Meds:  docusate sodium  100 mg Oral BID   enoxaparin (LOVENOX) injection  40 mg Subcutaneous Q24H   sodium chloride flush  3 mL Intravenous Q12H   Continuous Infusions:  lactated ringers 75 mL/hr at 02/16/22 0046   vancomycin 1,500 mg (02/17/22 1033)   PRN Meds:.acetaminophen **OR** acetaminophen, bisacodyl, hydrALAZINE, ondansetron **OR** ondansetron (ZOFRAN) IV, oxyCODONE, polyethylene glycol    Objective: Weight change:   Intake/Output Summary (Last 24 hours) at 02/17/2022 1221 Last data filed at 02/17/2022 0224 Gross per 24 hour  Intake 780 ml  Output --  Net 780 ml   Blood pressure 127/77, pulse 69, temperature 98.3 F (36.8 C), temperature source Oral, resp. rate 18, height 6' (1.829 m), weight 77.1 kg, SpO2 100 %. Temp:  [98.3 F (36.8 C)-100.6 F (38.1 C)] 98.3 F (36.8 C) (09/10 1024) Pulse Rate:  [69-111] 69 (09/10 1024) Resp:  [16-19] 18 (09/10 1024) BP: (105-139)/(76-93) 127/77 (09/10 1024) SpO2:  [100 %] 100 % (09/09 2000)  Physical Exam: Physical  Exam Constitutional:      Appearance: He is well-developed.  HENT:     Head: Atraumatic.  Eyes:     Conjunctiva/sclera: Conjunctivae normal.  Cardiovascular:     Rate and Rhythm: Normal rate and regular rhythm.  Pulmonary:     Effort: Pulmonary effort is normal. No respiratory distress.     Breath sounds: Normal breath sounds. No stridor. No wheezing.  Abdominal:     General: There is no distension.     Palpations: Abdomen is soft.  Musculoskeletal:     Cervical back: Normal range of motion and neck supple.  Skin:    General: Skin is warm and dry.     Findings: No erythema or rash.  Neurological:     General: No focal deficit present.     Mental Status: He is alert and oriented to person, place, and time.  Psychiatric:        Mood and Affect: Mood normal.        Behavior: Behavior normal.        Thought Content: Thought content normal.        Judgment: Judgment normal.    Face 02/17/2022:     CBC:    BMET Recent Labs    02/15/22 0458 02/16/22 0050  NA 132* 131*  K 3.4* 3.9  CL 95* 97*  CO2 27 22  GLUCOSE 126* 92  BUN 11 16  CREATININE 1.11 0.83  CALCIUM 9.4 8.5*     Liver Panel  Recent Labs    02/15/22 0458  PROT 7.3  ALBUMIN 3.7  AST 19  ALT 18  ALKPHOS 64  BILITOT 0.7       Sedimentation Rate No results for input(s): "ESRSEDRATE" in the last 72 hours. C-Reactive Protein No results for input(s): "CRP" in the last 72 hours.  Micro Results: Recent Results (from the past 720 hour(s))  Blood Culture (routine x 2)     Status: Abnormal   Collection Time: 02/15/22  4:58 AM   Specimen: BLOOD LEFT ARM  Result Value Ref Range Status   Specimen Description BLOOD LEFT ARM  Final   Special Requests   Final    BOTTLES DRAWN AEROBIC AND ANAEROBIC Blood Culture results may not be optimal due to an excessive volume of blood received in culture bottles   Culture  Setup Time   Final    GRAM POSITIVE COCCI IN CLUSTERS IN BOTH AEROBIC AND ANAEROBIC  BOTTLES CRITICAL RESULT CALLED TO, READ BACK BY AND VERIFIED WITH: PHARMD FRANK ABBOTT ON 02/15/22 @ 2314 BY DRT Performed at [facility], [address] [place], [place] [address]    Culture METHICILLIN RESISTANT STAPHYLOCOCCUS AUREUS (A)  Final   Report Status 02/17/2022 FINAL  Final   Organism ID, Bacteria METHICILLIN RESISTANT STAPHYLOCOCCUS AUREUS  Final      Susceptibility   Methicillin resistant staphylococcus aureus - MIC*    CIPROFLOXACIN >=8 RESISTANT Resistant     ERYTHROMYCIN >=8 RESISTANT Resistant     GENTAMICIN <=0.5 SENSITIVE Sensitive     OXACILLIN >=4 RESISTANT Resistant     TETRACYCLINE <=1 SENSITIVE Sensitive     VANCOMYCIN 1 SENSITIVE Sensitive     TRIMETH/SULFA >=320 RESISTANT Resistant     CLINDAMYCIN >=8 RESISTANT Resistant     RIFAMPIN <=0.5 SENSITIVE Sensitive     Inducible Clindamycin NEGATIVE Sensitive     * METHICILLIN RESISTANT STAPHYLOCOCCUS AUREUS  Blood Culture (routine x 2)     Status: Abnormal   Collection Time: 02/15/22  4:58 AM   Specimen: BLOOD RIGHT FOREARM  Result Value Ref Range Status   Specimen Description BLOOD RIGHT FOREARM  Final   Special Requests   Final    BOTTLES DRAWN AEROBIC AND ANAEROBIC Blood Culture results may not be optimal due to an excessive volume of blood received in culture bottles   Culture  Setup Time   Final    ANAEROBIC BOTTLE ONLY GRAM POSITIVE COCCI IN CLUSTERS CRITICAL VALUE NOTED.  VALUE IS CONSISTENT WITH PREVIOUSLY REPORTED AND CALLED VALUE.    Culture (A)  Final    STAPHYLOCOCCUS AUREUS SUSCEPTIBILITIES PERFORMED ON PREVIOUS CULTURE WITHIN THE LAST 5 DAYS. Performed at [facility], [address] [place], [place] [address]    Report Status 02/17/2022 FINAL  Final  Blood Culture ID Panel (Reflexed)     Status: Abnormal   Collection Time: 02/15/22  4:58 AM  Result Value Ref Range Status   Enterococcus faecalis NOT DETECTED NOT DETECTED Final   Enterococcus Faecium NOT DETECTED NOT DETECTED  Final   Listeria monocytogenes NOT DETECTED NOT DETECTED Final   Staphylococcus species DETECTED (A) NOT DETECTED Final    Comment: CRITICAL RESULT CALLED TO, READ BACK BY AND VERIFIED WITH: PHARMD FRANK ABBOTT ON 02/15/22 @ 2314 BY DRT    Staphylococcus aureus (BCID) DETECTED (A) NOT DETECTED Final  Comment: Methicillin (oxacillin)-resistant Staphylococcus aureus (MRSA). MRSA is predictably resistant to beta-lactam antibiotics (except ceftaroline). Preferred therapy is vancomycin unless clinically contraindicated. Patient requires contact precautions if  hospitalized. CRITICAL RESULT CALLED TO, READ BACK BY AND VERIFIED WITH: PHARMD FRANK ABBOTT ON 02/15/22 @ 2314 BY DRT    Staphylococcus epidermidis NOT DETECTED NOT DETECTED Final   Staphylococcus lugdunensis NOT DETECTED NOT DETECTED Final   Streptococcus species NOT DETECTED NOT DETECTED Final   Streptococcus agalactiae NOT DETECTED NOT DETECTED Final   Streptococcus pneumoniae NOT DETECTED NOT DETECTED Final   Streptococcus pyogenes NOT DETECTED NOT DETECTED Final   A.calcoaceticus-baumannii NOT DETECTED NOT DETECTED Final   Bacteroides fragilis NOT DETECTED NOT DETECTED Final   Enterobacterales NOT DETECTED NOT DETECTED Final   Enterobacter cloacae complex NOT DETECTED NOT DETECTED Final   Escherichia coli NOT DETECTED NOT DETECTED Final   Klebsiella aerogenes NOT DETECTED NOT DETECTED Final   Klebsiella oxytoca NOT DETECTED NOT DETECTED Final   Klebsiella pneumoniae NOT DETECTED NOT DETECTED Final   Proteus species NOT DETECTED NOT DETECTED Final   Salmonella species NOT DETECTED NOT DETECTED Final   Serratia marcescens NOT DETECTED NOT DETECTED Final   Haemophilus influenzae NOT DETECTED NOT DETECTED Final   Neisseria meningitidis NOT DETECTED NOT DETECTED Final   Pseudomonas aeruginosa NOT DETECTED NOT DETECTED Final   Stenotrophomonas maltophilia NOT DETECTED NOT DETECTED Final   Candida albicans NOT DETECTED NOT DETECTED  Final   Candida auris NOT DETECTED NOT DETECTED Final   Candida glabrata NOT DETECTED NOT DETECTED Final   Candida krusei NOT DETECTED NOT DETECTED Final   Candida parapsilosis NOT DETECTED NOT DETECTED Final   Candida tropicalis NOT DETECTED NOT DETECTED Final   Cryptococcus neoformans/gattii NOT DETECTED NOT DETECTED Final   Meth resistant mecA/C and MREJ DETECTED (A) NOT DETECTED Final    Comment: CRITICAL RESULT CALLED TO, READ BACK BY AND VERIFIED WITH: PHARMD FRANK ABBOTT ON 02/15/22 @ 2314 BY DRT Performed at [facility], [address] [place], [place] [address]   Aerobic Culture w Gram Stain (superficial specimen)     Status: None   Collection Time: 02/15/22 10:20 AM   Specimen: KNEE  Result Value Ref Range Status   Specimen Description KNEE  Final   Special Requests NONE  Final   Gram Stain   Final    NO WBC SEEN RARE GRAM POSITIVE COCCI Performed at [facility], [address], [place], [place] [address]    Culture   Final    MODERATE METHICILLIN RESISTANT STAPHYLOCOCCUS AUREUS   Report Status 02/17/2022 FINAL  Final   Organism ID, Bacteria METHICILLIN RESISTANT STAPHYLOCOCCUS AUREUS  Final      Susceptibility   Methicillin resistant staphylococcus aureus - MIC*    CIPROFLOXACIN >=8 RESISTANT Resistant     ERYTHROMYCIN >=8 RESISTANT Resistant     GENTAMICIN <=0.5 SENSITIVE Sensitive     OXACILLIN >=4 RESISTANT Resistant     TETRACYCLINE <=1 SENSITIVE Sensitive     VANCOMYCIN 1 SENSITIVE Sensitive     TRIMETH/SULFA >=320 RESISTANT Resistant     CLINDAMYCIN >=8 RESISTANT Resistant     RIFAMPIN <=0.5 SENSITIVE Sensitive     Inducible Clindamycin NEGATIVE Sensitive     * MODERATE METHICILLIN RESISTANT STAPHYLOCOCCUS AUREUS    Studies/Results: MR KNEE LEFT W WO CONTRAST  Result Date: 02/17/2022 CLINICAL DATA:  Knee redness, pain, swelling. Septic arthritis suspected, knee, xray done EXAM: MRI OF THE LEFT KNEE WITHOUT AND WITH CONTRAST TECHNIQUE:  Multiplanar, multisequence MR imaging of the left knee was performed both before and after administration of intravenous contrast. CONTRAST:  7.23mL GADAVIST GADOBUTROL 1 MMOL/ML IV SOLN COMPARISON:  X-ray 02/15/2022 FINDINGS: MENISCI Medial meniscus:  Intact. Lateral meniscus:  Intact. LIGAMENTS Cruciates: Intact ACL and PCL. Collaterals: Intact MCL. Lateral collateral ligament complex intact. CARTILAGE Patellofemoral: Minimal cartilage surface irregularity along the patellar apex inferiorly. No trochlear cartilage defect. Medial:  No chondral defect. Lateral:  No chondral defect. MISCELLANEOUS Joint: Small joint effusion without synovitis. Fat pads within normal limits. Popliteal Fossa:  No Baker's cyst. Intact popliteus tendon. Extensor Mechanism:  Intact quadriceps and patellar tendons. Bones: No acute fracture. No dislocation. No bone marrow edema. No periostitis. No erosion. No marrow replacing bone lesion. Other: Prepatellar soft tissue wound with underlying complex fluid collection with partial rim enhancement. Approximate measurements of 6.0 x 1.0 x 3.0 cm (series 19, image 14). Extensive subcutaneous edema and enhancement throughout the soft tissues of the lower extremity centered at the knee anteriorly. There is deep fascial edema and enhancement. Mild intramuscular edema within the superficial aspects of the distal vastus lateralis and vastus medialis muscles. IMPRESSION: 1. Prepatellar soft tissue wound with underlying complex fluid collection with partial rim enhancement measuring approximately 6 x 1 x 3 cm compatible with phlegmon/developing abscess. 2. Extensive findings of cellulitis and myofasciitis of the imaged lower extremity. 3. Small joint effusion without synovitis, favored reactive. Septic arthritis is not entirely excluded and arthrocentesis with joint fluid analysis should be considered. 4. No evidence of osteomyelitis. 5. Intact menisci.  Intact cruciate and collateral ligaments.  Electronically Signed   By: Duanne Guess D.O.   On: 02/17/2022 09:09   ECHOCARDIOGRAM COMPLETE  Result Date: 02/15/2022    ECHOCARDIOGRAM REPORT   Patient Name:   Eric Woods Date of Exam: 02/15/2022 Medical Rec #:  [id number]       Height:       72.0 in Accession #:    [id number]      Weight:       170.0 lb Date of Birth:  23-Sep-1975       BSA:          1.988 m Patient Age:    45 years        BP:           134/93 mmHg Patient Gender: M               HR:           102 bpm. Exam Location:  Inpatient Procedure: 2D Echo Indications:    endocarditis  History:        Patient has no prior history of Echocardiogram examinations.  Sonographer:    [name] RDCS Referring Phys: [id number] JENNIFER YATES IMPRESSIONS  1. Redundant MV chordae noted.  2. Left ventricular ejection fraction, by estimation, is 60 to 65%. The left ventricle has normal function. The left ventricle has no regional wall motion abnormalities. Left ventricular diastolic parameters were normal.  3. Right ventricular systolic function is normal. The right ventricular size is normal. Tricuspid regurgitation signal is inadequate for assessing PA pressure.  4. The mitral valve is normal in structure. No evidence of mitral valve regurgitation. No evidence of mitral stenosis.  5. The aortic valve is tricuspid. Aortic valve regurgitation is trivial. No aortic stenosis is present.  6. There is borderline dilatation of the aortic root, measuring 38 mm.  7. The inferior vena cava is normal in size with greater than  50% respiratory variability, suggesting right atrial pressure of 3 mmHg. FINDINGS  Left Ventricle: Left ventricular ejection fraction, by estimation, is 60 to 65%. The left ventricle has normal function. The left ventricle has no regional wall motion abnormalities. The left ventricular internal cavity size was normal in size. There is  no left ventricular hypertrophy. Left ventricular diastolic parameters were normal. Right Ventricle: The right  ventricular size is normal. Right ventricular systolic function is normal. Tricuspid regurgitation signal is inadequate for assessing PA pressure. The tricuspid regurgitant velocity is 2.22 m/s, and with an assumed right atrial  pressure of 3 mmHg, the estimated right ventricular systolic pressure is 22.7 mmHg. Left Atrium: Left atrial size was normal in size. Right Atrium: Right atrial size was normal in size. Pericardium: There is no evidence of pericardial effusion. Mitral Valve: The mitral valve is normal in structure. No evidence of mitral valve regurgitation. No evidence of mitral valve stenosis. Tricuspid Valve: The tricuspid valve is normal in structure. Tricuspid valve regurgitation is trivial. No evidence of tricuspid stenosis. Aortic Valve: The aortic valve is tricuspid. Aortic valve regurgitation is trivial. No aortic stenosis is present. Pulmonic Valve: The pulmonic valve was normal in structure. Pulmonic valve regurgitation is not visualized. No evidence of pulmonic stenosis. Aorta: The aortic root is normal in size and structure. There is borderline dilatation of the aortic root, measuring 38 mm. Venous: The inferior vena cava is normal in size with greater than 50% respiratory variability, suggesting right atrial pressure of 3 mmHg. IAS/Shunts: No atrial level shunt detected by color flow Doppler. Additional Comments: Redundant MV chordae noted.  LEFT VENTRICLE PLAX 2D LVIDd:         5.00 cm   Diastology LVIDs:         3.20 cm   LV e' medial:    10.40 cm/s LV PW:         1.00 cm   LV E/e' medial:  8.4 LV IVS:        1.00 cm   LV e' lateral:   14.10 cm/s LVOT diam:     2.00 cm   LV E/e' lateral: 6.2 LV SV:         51 LV SV Index:   26 LVOT Area:     3.14 cm  RIGHT VENTRICLE             IVC RV S prime:     17.50 cm/s  IVC diam: 2.10 cm TAPSE (M-mode): 2.2 cm LEFT ATRIUM             Index        RIGHT ATRIUM           Index LA diam:        3.60 cm 1.81 cm/m   RA Area:     14.30 cm LA Vol (A2C):    45.7 ml 22.98 ml/m  RA Volume:   32.60 ml  16.40 ml/m LA Vol (A4C):   46.5 ml 23.39 ml/m LA Biplane Vol: 47.1 ml 23.69 ml/m  AORTIC VALVE LVOT Vmax:   113.00 cm/s LVOT Vmean:  73.900 cm/s LVOT VTI:    0.163 m  AORTA Ao Root diam: 3.80 cm Ao Asc diam:  3.30 cm MITRAL VALVE               TRICUSPID VALVE MV Area (PHT): 5.02 cm    TR Peak grad:   19.7 mmHg MV Decel Time: 151 msec    TR Vmax:  222.00 cm/s MV E velocity: 87.30 cm/s MV A velocity: 96.30 cm/s  SHUNTS MV E/A ratio:  0.91        Systemic VTI:  0.16 m                            Systemic Diam: 2.00 cm Olga Millers MD Electronically signed by Olga Millers MD Signature Date/Time: 02/15/2022/2:52:02 PM    Final       Assessment/Plan:  INTERVAL HISTORY: MRI performed yesterday read as showing complex fluid collection with rim enhancement that 6 x 1 x 3 cm with a small joint effusion without synovitis thought to be reactive.     Principal Problem:   Cellulitis Active Problems:   Polysubstance abuse (HCC)   Infection of skin due to methicillin resistant Staphylococcus aureus (MRSA)   Tobacco dependence    Eric Woods is a 46 y.o. 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  #1 MRSA bacteremia  2D echocardiogram does not show evidence of endocarditis  He is going back to the operating room with orthopedic surgery for I&D exploration of wound again though they do not believe that the joint itself is involved.  We will continue vancomycin  Blood cultures have been repeated  He should have a transesophageal echocardiogram at some point this week.   #2 Methamphetamine abuse: he needs a long term plan to stop this as it is what drives , worsen his skin pickign  I spent 51  minutes with the patient including than 50% of the time  in face to face counseling of the patient the nature of MRSA bacteremia soft tissue infections deeper infections, personally reviewing MRI of the left knee as well as transthoracic echocardiogram along with review of medical records in preparation for the visit and during the visit and in coordination of his care.   New infectious disease team to take over tomorrow.   LOS: 2 days   Eric Woods 02/17/2022, 12:21 PM

## 2022-02-17 NOTE — Progress Notes (Signed)
Pt seen in room, alert/oriented in no apparent distress. Pt seen in room without ankle monitor. Per pt probation officer Rodrilgluez removed the monitor since he doesn't need it anymore, confirmed by NS Victorino Dike,  Pt informed this Clinical research associate he need to call the probation officer once he'll be discharge.

## 2022-02-17 NOTE — Op Note (Signed)
Orthopaedic Surgery Operative Note (CSN: [id number])  Eric Woods  1975/11/10 Date of Surgery: 02/14/2022 - 02/17/2022   Diagnoses:  Left knee infected prepatellar bursa  Procedure: Left knee incision and debridement of deep abscess Left knee complex wound closure Left knee prepatellar bursectomy   Operative Finding Successful completion of the planned procedure.  Patient had a superficial I&D at the bedside however there was slow E flux of the purulent material.  We felt that a surgical debridement would help him clear this infection more quickly.  There is a 6 x 4 x 2 cm cavity.  This extended to the suprapatellar lateral recess however did not enter the joint.  The patient's motion preop was consistent with a cellulitis rather than a septic joint.  Patient will have 3 inches of packing removed daily, can be discharged at the discretion of the medicine and infectious disease teams.  Post-operative plan: The patient will be weightbearing to tolerance.  The patient will be readmitted to the hospitalist.  DVT prophylaxis per primary team, no orthopedic contraindications.   Pain control with PRN pain medication preferring oral medicines.  Follow up plan will be scheduled in approximately 14 days for incision check and suture removal.  Post-Op Diagnosis: Same Surgeons:Primary: Bjorn Pippin, MD Assistants:Caroline McBane PA-C Location: [facility] OR ROOM 08 Anesthesia: General with local anesthesia Antibiotics:  Vancomycin, 1 g vancomycin powder, 1.2 g of tobramycin powder Tourniquet time: None Estimated Blood Loss: Minimal Complications: None Specimens: 2 for culture, left knee 1 and 2 Implants: * No implants in log *  Indications for Surgery:   Eric Woods is a 46 y.o. male with history of drug abuse with multiple skin lesions in the past.  Patient was not improving with antibiotics alone.  Benefits and risks of operative and nonoperative management were discussed prior to surgery with  patient/guardian(s) and informed consent form was completed.  Specific risks including infection, need for additional surgery, continued infection, need for further washout, extension into the joint, worsening infection.   Procedure:   The patient was identified properly. Informed consent was obtained and the surgical site was marked. The patient was taken up to suite where general anesthesia was induced.  The patient was positioned supine on a regular table.  The left knee was prepped and draped in the usual sterile fashion.  Timeout was performed before the beginning of the case.  Began by making a 6 cm incision over the anterior knee, extending the incision previously made by about 2 cm on each edge.  With the skin sharply achieving hemostasis we progressed.  We performed a complete bursectomy using a rondure and scissor.  Once the bursectomy is complete we did a abscess decompression noting there was purulent material medial to her incision.  We decompressed this area and performed excisional debridement of the bursal sac, skin, fascia and some purulent tissue.  We irrigated with 3 L of normal saline.  We confirmed that our debridement is appropriate.  No other unhealthy tissue was noted.  We then placed local vancomycin and tobramycin powder.  We closed our incision loosely with nylon suture.  We then packed with gauze, iodoform.  Sterile dressing was placed.  Patient awoken taken to PACU in stable condition.  Alfonse Alpers, PA-C, present and scrubbed throughout the case, critical for completion in a timely fashion, and for retraction, instrumentation, closure.

## 2022-02-17 NOTE — Transfer of Care (Signed)
Immediate Anesthesia Transfer of Care Note  Patient: Eric Woods  Procedure(s) Performed: IRRIGATION AND DEBRIDEMENT KNEE (Left: Knee)  Patient Location: PACU  Anesthesia Type:General  Level of Consciousness: drowsy  Airway & Oxygen Therapy: Patient Spontanous Breathing  Post-op Assessment: Report given to RN and Post -op Vital signs reviewed and stable  Post vital signs: Reviewed and stable  Last Vitals:  Vitals Value Taken Time  BP 150/98 02/17/22 1835  Temp 36.8 C 02/17/22 1835  Pulse 66 02/17/22 1839  Resp 16 02/17/22 1839  SpO2 99 % 02/17/22 1839  Vitals shown include unvalidated device data.  Last Pain:  Vitals:   02/17/22 1835  TempSrc:   PainSc: 0-No pain      Patients Stated Pain Goal: 5 (02/16/22 1123)  Complications: No notable events documented.

## 2022-02-17 NOTE — Consult Note (Addendum)
WOC Nurse Consult Note: WOC Nursing is simultaneously consulted with Orthopedics and Ortho has seen and taken to OR for debridement of area of fluid collection.  Orders received for daily wound care are in the Bedside RN's scope of practice and do not require WOC Nursing.  I attempted to contact via Secure Chat but Dr. Everardo Pacific is not available. I called the Service and am awaiting a call back to request that he write those orders for Nursing. His PA-C has returned my call and is making those changes tonight.  No role for WOC nursing at this time.  WOC nursing team will not follow, but will remain available to this patient, the nursing and medical teams.  Please re-consult if needed.  Thank you for inviting [place] to participate in this patient's Plan of Care.  Ladona Mow, MSN, RN, CNS, GNP, Leda Min, [employer], [employer] phone:  [phone]

## 2022-02-17 NOTE — Plan of Care (Signed)
  Problem: Clinical Measurements: Goal: Ability to avoid or minimize complications of infection will improve Outcome: Not Progressing   Problem: Skin Integrity: Goal: Skin integrity will improve Outcome: Not Progressing   Problem: Education: Goal: Knowledge of General Education information will improve Description: Including pain rating scale, medication(s)/side effects and non-pharmacologic comfort measures Outcome: Not Progressing   Problem: Health Behavior/Discharge Planning: Goal: Ability to manage health-related needs will improve Outcome: Not Progressing   Problem: Clinical Measurements: Goal: Ability to maintain clinical measurements within normal limits will improve Outcome: Not Progressing Goal: Will remain free from infection Outcome: Not Progressing Goal: Diagnostic test results will improve Outcome: Not Progressing Goal: Respiratory complications will improve Outcome: Not Progressing Goal: Cardiovascular complication will be avoided Outcome: Not Progressing   

## 2022-02-17 NOTE — Anesthesia Procedure Notes (Signed)
Procedure Name: LMA Insertion Date/Time: 02/17/2022 5:55 PM  Performed by: Laruth Bouchard., CRNAPre-anesthesia Checklist: Patient identified, Emergency Drugs available, Suction available, Patient being monitored and Timeout performed Patient Re-evaluated:Patient Re-evaluated prior to induction Oxygen Delivery Method: Circle system utilized Preoxygenation: Pre-oxygenation with 100% oxygen Induction Type: IV induction LMA: LMA inserted LMA Size: 4.0 Number of attempts: 1 Placement Confirmation: positive ETCO2 and breath sounds checked- equal and bilateral Tube secured with: Tape Dental Injury: Teeth and Oropharynx as per pre-operative assessment

## 2022-02-18 ENCOUNTER — Inpatient Hospital Stay (HOSPITAL_COMMUNITY): Payer: Self-pay

## 2022-02-18 ENCOUNTER — Encounter (HOSPITAL_COMMUNITY)
Admission: EM | Disposition: A | Discharge status: Home / Self Care | Payer: Self-pay | Source: Home / Self Care | Attending: Internal Medicine

## 2022-02-18 ENCOUNTER — Inpatient Hospital Stay (HOSPITAL_COMMUNITY): Payer: Self-pay | Admitting: Anesthesiology

## 2022-02-18 ENCOUNTER — Encounter (HOSPITAL_COMMUNITY): Payer: Self-pay | Admitting: Orthopaedic Surgery

## 2022-02-18 DIAGNOSIS — R7881 Bacteremia: Secondary | ICD-10-CM

## 2022-02-18 DIAGNOSIS — F1290 Cannabis use, unspecified, uncomplicated: Secondary | ICD-10-CM

## 2022-02-18 DIAGNOSIS — L0201 Cutaneous abscess of face: Secondary | ICD-10-CM

## 2022-02-18 DIAGNOSIS — R609 Edema, unspecified: Secondary | ICD-10-CM

## 2022-02-18 DIAGNOSIS — M009 Pyogenic arthritis, unspecified: Secondary | ICD-10-CM

## 2022-02-18 DIAGNOSIS — F1510 Other stimulant abuse, uncomplicated: Secondary | ICD-10-CM

## 2022-02-18 DIAGNOSIS — I38 Endocarditis, valve unspecified: Secondary | ICD-10-CM

## 2022-02-18 DIAGNOSIS — M7042 Prepatellar bursitis, left knee: Secondary | ICD-10-CM

## 2022-02-18 HISTORY — PX: TEE WITHOUT CARDIOVERSION: SHX5443

## 2022-02-18 LAB — HIV ANTIBODY (ROUTINE TESTING W REFLEX): HIV Screen 4th Generation wRfx: NONREACTIVE

## 2022-02-18 SURGERY — ECHOCARDIOGRAM, TRANSESOPHAGEAL
Anesthesia: Monitor Anesthesia Care

## 2022-02-18 MED ORDER — CEFAZOLIN SODIUM-DEXTROSE 2-4 GM/100ML-% IV SOLN
2.0000 g | INTRAVENOUS | Status: AC
  Administered 2022-02-19: 2 g via INTRAVENOUS
  Filled 2022-02-18: qty 100

## 2022-02-18 MED ORDER — CHLORHEXIDINE GLUCONATE 4 % EX LIQD: 60.0000 mL | Freq: Once | CUTANEOUS | Status: DC

## 2022-02-18 MED ORDER — SODIUM CHLORIDE 0.9 % IV SOLN
INTRAVENOUS | Status: AC | PRN
  Administered 2022-02-18: 500 mL via INTRAVENOUS

## 2022-02-18 MED ORDER — PROPOFOL 500 MG/50ML IV EMUL
INTRAVENOUS | Status: DC | PRN
  Administered 2022-02-18: 150 ug/kg/min via INTRAVENOUS

## 2022-02-18 MED ORDER — LIDOCAINE-EPINEPHRINE (PF) 2 %-1:200000 IJ SOLN
20.0000 mL | Freq: Once | INTRAMUSCULAR | Status: DC
  Filled 2022-02-18 (×2): qty 20

## 2022-02-18 MED ORDER — POVIDONE-IODINE 10 % EX SWAB: 2.0000 | Freq: Once | CUTANEOUS | Status: DC

## 2022-02-18 NOTE — Anesthesia Procedure Notes (Signed)
Procedure Name: MAC Date/Time: 02/18/2022 10:40 AM  Performed by: Griffin Dakin, CRNAPre-anesthesia Checklist: Patient identified, Emergency Drugs available, Suction available, Patient being monitored and Timeout performed Patient Re-evaluated:Patient Re-evaluated prior to induction Oxygen Delivery Method: Nasal cannula Induction Type: IV induction Placement Confirmation: positive ETCO2 and breath sounds checked- equal and bilateral Tube secured with: Tape Dental Injury: Teeth and Oropharynx as per pre-operative assessment

## 2022-02-18 NOTE — Progress Notes (Signed)
Lower extremity venous has been completed.   Preliminary results in CV Proc.   Aundra Millet Cannen Dupras 02/18/2022 3:42 PM

## 2022-02-18 NOTE — Progress Notes (Signed)
   ORTHOPAEDIC PROGRESS NOTE  s/p Procedure(s):  Left knee incision and debridement of deep abscess Left knee prepatellar bursectomy on 02/17/2022  SUBJECTIVE: Patient continues to have pain in the left knee. He reports burning at the top of the knee. He notes it does feel better than he did yesterday morning. Swelling in the left lower leg has improved. Doppler was negative for DVT. Mother at bedside.    OBJECTIVE: PE: Left lower extremity: continues to have significant erythema of the left knee, incision with 3 nylon sutures, packing in place, no fluctuance noted around the incision, lower extremity edema has improved since yesterday, continues to be tender to palpation throughout the left knee, ROM from 0 to about 60 degrees. Distal motor and sensory function intact.   Vitals:   02/18/22 1123 02/18/22 1439  BP: (!) 125/108 126/84  Pulse: (!) 54 76  Resp: 14 19  Temp: 97.7 F (36.5 C) 98 F (36.7 C)  SpO2: 100% 100%   Cultures showing few gram positive cocci in clusters and rare gram positive cocci in pairs.   ASSESSMENT: Eric Woods is a 46 y.o. male POD#1  PLAN: Weightbearing: WBAT LLE Insicional and dressing care: Patient to have 3 inches of packing removed daily with daily dressing changes Orthopedic device(s): None Showering: Hold for now VTE prophylaxis: per primary team, no orthopedic contraindications Pain control: per primary team Follow - up plan: approximately 14 days for incision check and suture removal. Dispo: can be discharged at the discretion of the medicine and infectious disease teams. Recommend continuing antibiotics per infectious disease. Contact information:  Dr. Ramond Marrow, Alfonse Alpers, PA-C, After hours and holidays please check [email or web address] for group call information for [facility]   Alfonse Alpers, PA-C 02/18/2022

## 2022-02-18 NOTE — Progress Notes (Signed)
Echocardiogram 2D Echocardiogram has been performed.  Eric Woods 02/18/2022, 11:24 AM

## 2022-02-18 NOTE — Progress Notes (Signed)
Progress Note  Patient: Eric Woods [id number] DOB: 29-Oct-1975  DOA: 02/14/2022  DOS: 02/18/2022    Brief hospital course: Eric Woods is a 45 y.o. male with a history of methamphetamine use, incarceration, skin picking behavior, recurrent MRSA infections who presented with left knee pain and several areas of skin and soft tissue infections. Broad IV antibiotics were started, narrowed to vancomycin and the patient was admitted. Orthopedics consulted, performed bedside left knee I&D 9/8. Blood cultures have grown MRSA. TTE without vegetation. Left knee washout in OR performed 9/10 noting deep abscess without joint involvement. Continues on vancomycin with plans for TEE. Repeat blood cultures 9/10 are NGTD.  Assessment and Plan: Multifocal SSTI, MRSA bacteremia:  - Continue vancomycin. Note previous hospitalization for MRSA infection and patient is "unsure" whether he took oral doxycycline after discharge.  - Appreciate ID recommendations. Continuing vancomycin.  - Pt NPO for TEE today - Some lesions appear consistent with Osler nodes vs. Janeway lesions. No vegetation on TTE. Touched base with Dr. Radford Pax who will facilitate TEE arrangement this week. - Continue oral analgesic only. - Repeat blood cultures 9/10 sent, not resulted yet. - HIV NR July 2023, RPR NR, HCV and HPV SAg negative/NR. - Trend neutrophilic leukocytosis. Improving.  Left knee cellulitis and abscess: Intraarticular signal more consistent with reactive change, not suspected to be joint involvement per orthopedics.  - Orthopedics, Dr. Griffin Basil, performed operative I&D 9/10. Cultures TYTR though gram stain with GPC in pairs and GPC in clusters.  - Initial wound Cx grew MRSA.  - Distal LLE swelling since surgery. Pt has been on VTE ppx and ambulating regularly, no hx VTE. Elevated LLE Will monitor clinically. Bandage would limit yield of U/S at this time, though proximal clot can be ruled out, will check.   Left preseptal  cellulitis: Significantly improved. Was discussed with ophthalmology, Dr. Posey Pronto on 9/9  Polysubstance abuse: UDS +amphetamine, THC.  - Cessation counseling provided. Consider re-enrollment in treatment program  - TOC consulted  Tobacco use:  - Cessation counseling provided  Hyponatremia: Mild  Hypokalemia: Mild, resolved  Urinary hesitancy: UA negative. Symptoms currently resolved. - Monitor clinically/prn bladder scan.   Subjective: Left knee pain is stable, improved with medications. No new rashes. Clear that he intends never to use substances again. Has been having nocturnal drenching night sweats. Tmax 99.75F.    Objective: Vitals:   02/17/22 1905 02/17/22 1920 02/17/22 1930 02/17/22 2005  BP: (!) 149/89 (!) 135/94 (!) 135/104 130/86  Pulse: 70 69 67 84  Resp: 18 16 14 14   Temp:    99.9 F (37.7 C)  TempSrc:    Oral  SpO2: 100% 100% 100% 100%  Weight:      Height:      Gen: 46 y.o. male in no distress Pulm: Nonlabored breathing room air. Clear. CV: Regular rate and rhythm. No murmur, rub, or gallop. No JVD, no dependent edema. GI: Abdomen soft, non-tender, non-distended, with normoactive bowel sounds.  Ext: Warm, no deformities. LLE with 1-2+ pitting edema distal to knee.  Skin: No new rashes, lesions or ulcers on visualized skin. Eyelids less swollen, eyes wnl, forehead stable. Left knee wrapped, c/d/I.  Neuro: Alert and oriented. No focal neurological deficits. Psych: Judgement and insight appear fair. Mood euthymic & affect congruent. Behavior is appropriate.    Data Personally reviewed: CBC: Recent Labs  Lab 02/15/22 0458 02/16/22 0050 02/17/22 0630  WBC 15.9* 16.8* 13.3*  NEUTROABS 13.2*  --  9.8*  HGB 14.4 11.4* 11.7*  HCT 41.8 32.9* 33.5*  MCV 97.2 97.3 95.4  PLT 312 262 277   Basic Metabolic Panel: Recent Labs  Lab 02/15/22 0458 02/16/22 0050  NA 132* 131*  K 3.4* 3.9  CL 95* 97*  CO2 27 22  GLUCOSE 126* 92  BUN 11 16  CREATININE 1.11 0.83   CALCIUM 9.4 8.5*   GFR: Estimated Creatinine Clearance: 122.6 mL/min (by C-G formula based on SCr of 0.83 mg/dL). Liver Function Tests: Recent Labs  Lab 02/15/22 0458  AST 19  ALT 18  ALKPHOS 64  BILITOT 0.7  PROT 7.3  ALBUMIN 3.7    Recent Results (from the past 240 hour(s))  Blood Culture (routine x 2)     Status: Abnormal   Collection Time: 02/15/22  4:58 AM   Specimen: BLOOD LEFT ARM  Result Value Ref Range Status   Specimen Description BLOOD LEFT ARM  Final   Special Requests   Final    BOTTLES DRAWN AEROBIC AND ANAEROBIC Blood Culture results may not be optimal due to an excessive volume of blood received in culture bottles   Culture  Setup Time   Final    GRAM POSITIVE COCCI IN CLUSTERS IN BOTH AEROBIC AND ANAEROBIC BOTTLES CRITICAL RESULT CALLED TO, READ BACK BY AND VERIFIED WITH: PHARMD FRANK ABBOTT ON 02/15/22 @ 2314 BY DRT Performed at [facility], [address], [place], [place] [address]    Culture METHICILLIN RESISTANT STAPHYLOCOCCUS AUREUS (A)  Final   Report Status 02/17/2022 FINAL  Final   Organism ID, Bacteria METHICILLIN RESISTANT STAPHYLOCOCCUS AUREUS  Final      Susceptibility   Methicillin resistant staphylococcus aureus - MIC*    CIPROFLOXACIN >=8 RESISTANT Resistant     ERYTHROMYCIN >=8 RESISTANT Resistant     GENTAMICIN <=0.5 SENSITIVE Sensitive     OXACILLIN >=4 RESISTANT Resistant     TETRACYCLINE <=1 SENSITIVE Sensitive     VANCOMYCIN 1 SENSITIVE Sensitive     TRIMETH/SULFA >=320 RESISTANT Resistant     CLINDAMYCIN >=8 RESISTANT Resistant     RIFAMPIN <=0.5 SENSITIVE Sensitive     Inducible Clindamycin NEGATIVE Sensitive     * METHICILLIN RESISTANT STAPHYLOCOCCUS AUREUS  Blood Culture (routine x 2)     Status: Abnormal   Collection Time: 02/15/22  4:58 AM   Specimen: BLOOD RIGHT FOREARM  Result Value Ref Range Status   Specimen Description BLOOD RIGHT FOREARM  Final   Special Requests   Final    BOTTLES DRAWN AEROBIC AND  ANAEROBIC Blood Culture results may not be optimal due to an excessive volume of blood received in culture bottles   Culture  Setup Time   Final    ANAEROBIC BOTTLE ONLY GRAM POSITIVE COCCI IN CLUSTERS CRITICAL VALUE NOTED.  VALUE IS CONSISTENT WITH PREVIOUSLY REPORTED AND CALLED VALUE.    Culture (A)  Final    STAPHYLOCOCCUS AUREUS SUSCEPTIBILITIES PERFORMED ON PREVIOUS CULTURE WITHIN THE LAST 5 DAYS. Performed at [facility], [address], [place], [place] [address]    Report Status 02/17/2022 FINAL  Final  Blood Culture ID Panel (Reflexed)     Status: Abnormal   Collection Time: 02/15/22  4:58 AM  Result Value Ref Range Status   Enterococcus faecalis NOT DETECTED NOT DETECTED Final   Enterococcus Faecium NOT DETECTED NOT DETECTED Final   Listeria monocytogenes NOT DETECTED NOT DETECTED Final   Staphylococcus species DETECTED (A) NOT DETECTED Final    Comment: CRITICAL RESULT CALLED TO, READ BACK BY  AND VERIFIED WITH: PHARMD FRANK ABBOTT ON 02/15/22 @ [id number] BY DRT    Staphylococcus aureus (BCID) DETECTED (A) NOT DETECTED Final    Comment: Methicillin (oxacillin)-resistant Staphylococcus aureus (MRSA). MRSA is predictably resistant to beta-lactam antibiotics (except ceftaroline). Preferred therapy is vancomycin unless clinically contraindicated. Patient requires contact precautions if  hospitalized. CRITICAL RESULT CALLED TO, READ BACK BY AND VERIFIED WITH: PHARMD FRANK ABBOTT ON 02/15/22 @ [id number] BY DRT    Staphylococcus epidermidis NOT DETECTED NOT DETECTED Final   Staphylococcus lugdunensis NOT DETECTED NOT DETECTED Final   Streptococcus species NOT DETECTED NOT DETECTED Final   Streptococcus agalactiae NOT DETECTED NOT DETECTED Final   Streptococcus pneumoniae NOT DETECTED NOT DETECTED Final   Streptococcus pyogenes NOT DETECTED NOT DETECTED Final   A.calcoaceticus-baumannii NOT DETECTED NOT DETECTED Final   Bacteroides fragilis NOT DETECTED NOT DETECTED Final    Enterobacterales NOT DETECTED NOT DETECTED Final   Enterobacter cloacae complex NOT DETECTED NOT DETECTED Final   Escherichia coli NOT DETECTED NOT DETECTED Final   Klebsiella aerogenes NOT DETECTED NOT DETECTED Final   Klebsiella oxytoca NOT DETECTED NOT DETECTED Final   Klebsiella pneumoniae NOT DETECTED NOT DETECTED Final   Proteus species NOT DETECTED NOT DETECTED Final   Salmonella species NOT DETECTED NOT DETECTED Final   Serratia marcescens NOT DETECTED NOT DETECTED Final   Haemophilus influenzae NOT DETECTED NOT DETECTED Final   Neisseria meningitidis NOT DETECTED NOT DETECTED Final   Pseudomonas aeruginosa NOT DETECTED NOT DETECTED Final   Stenotrophomonas maltophilia NOT DETECTED NOT DETECTED Final   Candida albicans NOT DETECTED NOT DETECTED Final   Candida auris NOT DETECTED NOT DETECTED Final   Candida glabrata NOT DETECTED NOT DETECTED Final   Candida krusei NOT DETECTED NOT DETECTED Final   Candida parapsilosis NOT DETECTED NOT DETECTED Final   Candida tropicalis NOT DETECTED NOT DETECTED Final   Cryptococcus neoformans/gattii NOT DETECTED NOT DETECTED Final   Meth resistant mecA/C and MREJ DETECTED (A) NOT DETECTED Final    Comment: CRITICAL RESULT CALLED TO, READ BACK BY AND VERIFIED WITH: PHARMD FRANK ABBOTT ON 02/15/22 @ [id number] BY DRT Performed at [facility], [address] [place], [place] [address]   Aerobic Culture w Gram Stain (superficial specimen)     Status: None   Collection Time: 02/15/22 10:20 AM   Specimen: KNEE  Result Value Ref Range Status   Specimen Description KNEE  Final   Special Requests NONE  Final   Gram Stain   Final    NO WBC SEEN RARE GRAM POSITIVE COCCI Performed at [facility], [place] [address], [place], [place] [address]    Culture   Final    MODERATE METHICILLIN RESISTANT STAPHYLOCOCCUS AUREUS   Report Status 02/17/2022 FINAL  Final   Organism ID, Bacteria METHICILLIN RESISTANT STAPHYLOCOCCUS AUREUS  Final       Susceptibility   Methicillin resistant staphylococcus aureus - MIC*    CIPROFLOXACIN >=8 RESISTANT Resistant     ERYTHROMYCIN >=8 RESISTANT Resistant     GENTAMICIN <=0.5 SENSITIVE Sensitive     OXACILLIN >=4 RESISTANT Resistant     TETRACYCLINE <=1 SENSITIVE Sensitive     VANCOMYCIN 1 SENSITIVE Sensitive     TRIMETH/SULFA >=320 RESISTANT Resistant     CLINDAMYCIN >=8 RESISTANT Resistant     RIFAMPIN <=0.5 SENSITIVE Sensitive     Inducible Clindamycin NEGATIVE Sensitive     * MODERATE METHICILLIN RESISTANT STAPHYLOCOCCUS AUREUS  Surgical pcr screen     Status: Abnormal  Collection Time: 02/17/22 11:30 AM   Specimen: Nasal Mucosa; Nasal Swab  Result Value Ref Range Status   MRSA, PCR POSITIVE (A) NEGATIVE Final   Staphylococcus aureus POSITIVE (A) NEGATIVE Final    Comment: RESULT CALLED TO, READ BACK BY AND VERIFIED WITH: 02/17/2022 @ 1605 RN ANGELITO, ADC (NOTE) The Xpert SA Assay (FDA approved for NASAL specimens in patients 35 years of age and older), is one component of a comprehensive surveillance program. It is not intended to diagnose infection nor to guide or monitor treatment. Performed at [facility], [address], [place], [place] [address]   Aerobic/Anaerobic Culture w Gram Stain (surgical/deep wound)     Status: None (Preliminary result)   Collection Time: 02/17/22  6:05 PM   Specimen: Abscess  Result Value Ref Range Status   Specimen Description ABSCESS LEFT KNEE  Final   Special Requests SPECIMEN A  Final   Gram Stain   Final    MODERATE WBC PRESENT,BOTH PMN AND MONONUCLEAR FEW GRAM POSITIVE COCCI IN CLUSTERS RARE GRAM POSITIVE COCCI IN PAIRS    Culture   Final    TOO YOUNG TO READ Performed at [facility], [address], [place], [place] [address]    Report Status PENDING  Incomplete  Aerobic/Anaerobic Culture w Gram Stain (surgical/deep wound)     Status: None (Preliminary result)   Collection Time: 02/17/22  6:05 PM   Specimen:  Abscess  Result Value Ref Range Status   Specimen Description ABSCESS LEFT KNEE  Final   Special Requests SPECIMEN B  Final   Gram Stain   Final    FEW WBC PRESENT,BOTH PMN AND MONONUCLEAR RARE GRAM POSITIVE COCCI IN PAIRS    Culture   Final    TOO YOUNG TO READ Performed at [facility], [address] [place], [place] [address]    Report Status PENDING  Incomplete     MR KNEE LEFT W WO CONTRAST  Result Date: 02/17/2022 CLINICAL DATA:  Knee redness, pain, swelling. Septic arthritis suspected, knee, xray done EXAM: MRI OF THE LEFT KNEE WITHOUT AND WITH CONTRAST TECHNIQUE: Multiplanar, multisequence MR imaging of the left knee was performed both before and after administration of intravenous contrast. CONTRAST:  7.42mL GADAVIST GADOBUTROL 1 MMOL/ML IV SOLN COMPARISON:  X-ray 02/15/2022 FINDINGS: MENISCI Medial meniscus:  Intact. Lateral meniscus:  Intact. LIGAMENTS Cruciates: Intact ACL and PCL. Collaterals: Intact MCL. Lateral collateral ligament complex intact. CARTILAGE Patellofemoral: Minimal cartilage surface irregularity along the patellar apex inferiorly. No trochlear cartilage defect. Medial:  No chondral defect. Lateral:  No chondral defect. MISCELLANEOUS Joint: Small joint effusion without synovitis. Fat pads within normal limits. Popliteal Fossa:  No Baker's cyst. Intact popliteus tendon. Extensor Mechanism:  Intact quadriceps and patellar tendons. Bones: No acute fracture. No dislocation. No bone marrow edema. No periostitis. No erosion. No marrow replacing bone lesion. Other: Prepatellar soft tissue wound with underlying complex fluid collection with partial rim enhancement. Approximate measurements of 6.0 x 1.0 x 3.0 cm (series 19, image 14). Extensive subcutaneous edema and enhancement throughout the soft tissues of the lower extremity centered at the knee anteriorly. There is deep fascial edema and enhancement. Mild intramuscular edema within the superficial aspects of the distal  vastus lateralis and vastus medialis muscles. IMPRESSION: 1. Prepatellar soft tissue wound with underlying complex fluid collection with partial rim enhancement measuring approximately 6 x 1 x 3 cm compatible with phlegmon/developing abscess. 2. Extensive findings of cellulitis and myofasciitis of the  imaged lower extremity. 3. Small joint effusion without synovitis, favored reactive. Septic arthritis is not entirely excluded and arthrocentesis with joint fluid analysis should be considered. 4. No evidence of osteomyelitis. 5. Intact menisci.  Intact cruciate and collateral ligaments. Electronically Signed   By: Davina Poke D.O.   On: 02/17/2022 09:09     Family Communication: Mother at bedside  Disposition: Status is: Inpatient Remains inpatient appropriate because: Continued IV antibiotics for MRSA bacteremia Planned Discharge Destination: Home  Patrecia Pour, MD 02/18/2022 8:34 AM Page by [email or web address]

## 2022-02-18 NOTE — CV Procedure (Signed)
TRANSESOPHAGEAL ECHOCARDIOGRAM (TEE) NOTE  INDICATIONS: infective endocarditis  PROCEDURE:   Informed consent was obtained prior to the procedure. The risks, benefits and alternatives for the procedure were discussed and the patient comprehended these risks.  Risks include, but are not limited to, cough, sore throat, vomiting, nausea, somnolence, esophageal and stomach trauma or perforation, bleeding, low blood pressure, aspiration, pneumonia, infection, trauma to the teeth and death.    After a procedural time-out, the patient was given propofol for sedation by anesthesia. See their separate report.  The patient's heart rate, blood pressure, and oxygen saturation are monitored continuously during the procedure.The oropharynx was anesthetized with topical cetacaine.  The transesophageal probe was inserted in the esophagus and stomach without difficulty and multiple views were obtained.  The patient was kept under observation until the patient left the procedure room.  I was present face-to-face 100% of this time. The patient left the procedure room in stable condition.   Agitated microbubble saline contrast was not administered.  COMPLICATIONS:    There were no immediate complications.  Findings:  LEFT VENTRICLE: The left ventricular wall thickness is normal.  The left ventricular cavity is normal in size. Wall motion is normal.  LVEF is 60-65%.  RIGHT VENTRICLE:  The right ventricle is normal in structure and function without any thrombus or masses.    LEFT ATRIUM:  The left atrium is normal in size without any thrombus or masses.  There is not spontaneous echo contrast ("smoke") in the left atrium consistent with a low flow state.  LEFT ATRIAL APPENDAGE:  The left atrial appendage is free of any thrombus or masses. The appendage has single lobes. Pulse doppler indicates high flow in the appendage.  ATRIAL SEPTUM:  The atrial septum appears intact and is free of thrombus and/or  masses.  There is no evidence for interatrial shunting by color doppler.  RIGHT ATRIUM:  The right atrium is normal in size and function without any thrombus or masses.  MITRAL VALVE:  The mitral valve is normal in structure and function with  trivial  regurgitation. Redundant chordae are noted. There were no vegetations or stenosis.  AORTIC VALVE:  The aortic valve is trileaflet, normal in structure and function with  no  regurgitation.  There were no vegetations or stenosis  TRICUSPID VALVE:  The tricuspid valve is normal in structure and function with  trivial  regurgitation.  There were no vegetations or stenosis   PULMONIC VALVE:  The pulmonic valve is normal in structure and function with  trivial  regurgitation.  There were no vegetations or stenosis.   AORTIC ARCH, ASCENDING AND DESCENDING AORTA:  There was no Myrtis Ser et. Al, 1992) atherosclerosis of the ascending aorta, aortic arch, or proximal descending aorta.  12. PULMONARY VEINS: Anomalous pulmonary venous return was not noted.  13. PERICARDIUM: The pericardium appeared normal and non-thickened.  There is no pericardial effusion.  IMPRESSION:   No evidence of endocarditis. No LAA thrombus Negative for PFO by color doppler Trivial MR, TR, PI LVEF 60-65%, normal wall motion.  RECOMMENDATIONS:     Continue antibiotics per ID for bacteremia  Time Spent Directly with the Patient:  45 minutes   Chrystie Nose, MD, [facility], FACP  [place]  [facility]  Medical Director of the [facility]  [facility] Diplomate of the American Board of Clinical Lipidology Attending Cardiologist  Direct Dial: [phone]  Fax: [phone]  Website:  [email or web address]  Chrystie Nose 02/18/2022,  11:05 AM

## 2022-02-18 NOTE — Interval H&P Note (Signed)
History and Physical Interval Note:  02/18/2022 10:49 AM  [name]  has presented today for surgery, with the diagnosis of bacteremia.  The various methods of treatment have been discussed with the patient and family. After consideration of risks, benefits and other options for treatment, the patient has consented to  Procedure(s): TRANSESOPHAGEAL ECHOCARDIOGRAM (TEE) (N/A) as a surgical intervention.  The patient's history has been reviewed, patient examined, no change in status, stable for surgery.  I have reviewed the patient's chart and labs.  Questions were answered to the patient's satisfaction.     Eric Woods

## 2022-02-18 NOTE — Progress Notes (Signed)
Mobility Specialist Progress Note   02/18/22 1626  Mobility  Activity Ambulated with assistance in hallway  Level of Assistance Modified independent, requires aide device or extra time  Assistive Device  (IV pole)  Distance Ambulated (ft) 550 ft  Activity Response Tolerated well  $Mobility charge 1 Mobility   Received pt in bed having minimal pain(4/10) but agreeable to mobility. Pt ambulating w/ antalgic like gait, having no complaints and able to return to room w/o fault. Left back in bed w/ call bell in reach and all needs met.  Holland Falling Mobility Specialist MS [facility] #:  [phone] Acute Rehab Office:  [phone]

## 2022-02-18 NOTE — Transfer of Care (Signed)
Immediate Anesthesia Transfer of Care Note  Patient: Eric Woods  Procedure(s) Performed: TRANSESOPHAGEAL ECHOCARDIOGRAM (TEE)  Patient Location: PACU  Anesthesia Type:MAC  Level of Consciousness: awake and alert   Airway & Oxygen Therapy: Patient Spontanous Breathing and Patient connected to nasal cannula oxygen  Post-op Assessment: Report given to RN and Post -op Vital signs reviewed and stable  Post vital signs: Reviewed and stable  Last Vitals:  Vitals Value Taken Time  BP 116/78 02/18/22 1108  Temp    Pulse 68 02/18/22 1109  Resp 17 02/18/22 1109  SpO2 100 % 02/18/22 1109  Vitals shown include unvalidated device data.  Last Pain:  Vitals:   02/18/22 1025  TempSrc: Temporal  PainSc: 0-No pain      Patients Stated Pain Goal: 0 (07/61/51 8343)  Complications: No notable events documented.

## 2022-02-18 NOTE — Progress Notes (Signed)
[place] for Infectious Disease  Date of Admission:  02/14/2022      Total days of antibiotics 3   Vancomycin 5          ASSESSMENT: Eric Woods is a 46 y.o. male admitted with MRSA bacteremia complicated by left knee septic arthritis (now s/p I&D 9/10 with Dr. Griffin Basil), forehead abscess / skin infections. Surgical cultures with preliminary GPC in chains/clusters. Would be covered on vancomycin. Likely all MRSA.   Repeat BCx pending from 9/10. Scheduled for TEE today to rule out any endocarditis and ensure we can proceed with PO treatment.   While the surrounding cellulitis is improved, the left forehead abscess appears to be enlarging despite IV antibitoics; quite tense/fluctuant. Suspect this will need to be drained, he is in agreemnt. D/W Dr. Bonner Puna, Dr. Marla Roe to see the patient later today with plastics.   Methamphetamine use, intranasal. HIV non reactive, HCV Ab non-reactive. HBsAg pending. RPR non-reactive.    PLAN: FU TEE results Continue vancomycin  Follow plastics opinion on forehead abscess - suspect it needs drainage  Follow pending blood cultures  Follow pending micro from OR Lt knee    Principal Problem:   MRSA bacteremia Active Problems:   Cellulitis   Infection of skin due to methicillin resistant Staphylococcus aureus (MRSA)   Abscess of forehead   Septic arthritis of knee, left (HCC)   Polysubstance abuse (HCC)   Tobacco dependence    acetaminophen  650 mg Oral Q8H   celecoxib  100 mg Oral BID   chlorhexidine  60 mL Topical Once   Chlorhexidine Gluconate Cloth  6 each Topical Q0600   docusate sodium  100 mg Oral BID   enoxaparin (LOVENOX) injection  40 mg Subcutaneous Q24H   mupirocin ointment  1 Application Nasal BID   povidone-iodine  2 Application Topical Once   sodium chloride flush  3 mL Intravenous Q12H    SUBJECTIVE: Eric Woods says he is doing a little better today. Left knee is still swollen and painful as he would  expect having had surgery yesterday. He has done OK with the antibiotics - no concerns over receiving vancomycin. Feels the abscess on his forehead is larger. No trouble with any ROM of occular movements. No visual field changes.  No new musculoskeletal concerns.   Mom is in the room during our discussion.     Review of Systems: Review of Systems  Constitutional:  Negative for chills, fever and malaise/fatigue.  Eyes:  Negative for blurred vision, pain and discharge.  Cardiovascular:  Positive for leg swelling.  Musculoskeletal:  Positive for joint pain (lt knee). Negative for back pain and neck pain.  Skin:  Positive for rash.  Neurological:  Negative for dizziness and headaches.    Allergies  Allergen Reactions   Penicillins Other (See Comments)    Childhood allergy    OBJECTIVE: Vitals:   02/18/22 1025 02/18/22 1108 02/18/22 1115 02/18/22 1123  BP: (!) 130/91 116/78 116/84 (!) 125/108  Pulse: (!) 53 71 (!) 53 (!) 54  Resp: 15 17 17 14   Temp: 97.6 F (36.4 C) 97.7 F (36.5 C)  97.7 F (36.5 C)  TempSrc: Temporal     SpO2: 100% 97% 100% 100%  Weight:      Height:       Body mass index is 23.05 kg/m.  Physical Exam Vitals reviewed.  Constitutional:      Appearance: Normal appearance. He is not ill-appearing.  HENT:  Head:     Comments: Enlarging fluctuant abscess over left orbit on forehead. No altered ROM of EOMs and visual field normal.     Mouth/Throat:     Mouth: Mucous membranes are moist.     Pharynx: Oropharynx is clear.  Neurological:     Mental Status: He is alert.      Lab Results Lab Results  Component Value Date   WBC 13.3 (H) 02/17/2022   HGB 11.7 (L) 02/17/2022   HCT 33.5 (L) 02/17/2022   MCV 95.4 02/17/2022   PLT 277 02/17/2022    Lab Results  Component Value Date   CREATININE 0.83 02/16/2022   BUN 16 02/16/2022   NA 131 (L) 02/16/2022   K 3.9 02/16/2022   CL 97 (L) 02/16/2022   CO2 22 02/16/2022    Lab Results  Component  Value Date   ALT 18 02/15/2022   AST 19 02/15/2022   ALKPHOS 64 02/15/2022   BILITOT 0.7 02/15/2022     Microbiology: Recent Results (from the past 240 hour(s))  Blood Culture (routine x 2)     Status: Abnormal   Collection Time: 02/15/22  4:58 AM   Specimen: BLOOD LEFT ARM  Result Value Ref Range Status   Specimen Description BLOOD LEFT ARM  Final   Special Requests   Final    BOTTLES DRAWN AEROBIC AND ANAEROBIC Blood Culture results may not be optimal due to an excessive volume of blood received in culture bottles   Culture  Setup Time   Final    GRAM POSITIVE COCCI IN CLUSTERS IN BOTH AEROBIC AND ANAEROBIC BOTTLES CRITICAL RESULT CALLED TO, READ BACK BY AND VERIFIED WITH: PHARMD FRANK ABBOTT ON 02/15/22 @ 2314 BY DRT Performed at [facility], [address], [place], [place] [address]    Culture METHICILLIN RESISTANT STAPHYLOCOCCUS AUREUS (A)  Final   Report Status 02/17/2022 FINAL  Final   Organism ID, Bacteria METHICILLIN RESISTANT STAPHYLOCOCCUS AUREUS  Final      Susceptibility   Methicillin resistant staphylococcus aureus - MIC*    CIPROFLOXACIN >=8 RESISTANT Resistant     ERYTHROMYCIN >=8 RESISTANT Resistant     GENTAMICIN <=0.5 SENSITIVE Sensitive     OXACILLIN >=4 RESISTANT Resistant     TETRACYCLINE <=1 SENSITIVE Sensitive     VANCOMYCIN 1 SENSITIVE Sensitive     TRIMETH/SULFA >=320 RESISTANT Resistant     CLINDAMYCIN >=8 RESISTANT Resistant     RIFAMPIN <=0.5 SENSITIVE Sensitive     Inducible Clindamycin NEGATIVE Sensitive     * METHICILLIN RESISTANT STAPHYLOCOCCUS AUREUS  Blood Culture (routine x 2)     Status: Abnormal   Collection Time: 02/15/22  4:58 AM   Specimen: BLOOD RIGHT FOREARM  Result Value Ref Range Status   Specimen Description BLOOD RIGHT FOREARM  Final   Special Requests   Final    BOTTLES DRAWN AEROBIC AND ANAEROBIC Blood Culture results may not be optimal due to an excessive volume of blood received in culture bottles   Culture  Setup  Time   Final    ANAEROBIC BOTTLE ONLY GRAM POSITIVE COCCI IN CLUSTERS CRITICAL VALUE NOTED.  VALUE IS CONSISTENT WITH PREVIOUSLY REPORTED AND CALLED VALUE.    Culture (A)  Final    STAPHYLOCOCCUS AUREUS SUSCEPTIBILITIES PERFORMED ON PREVIOUS CULTURE WITHIN THE LAST 5 DAYS. Performed at [facility], [address] [place], [place] [address]    Report Status 02/17/2022 FINAL  Final  Blood Culture ID Panel (Reflexed)  Status: Abnormal   Collection Time: 02/15/22  4:58 AM  Result Value Ref Range Status   Enterococcus faecalis NOT DETECTED NOT DETECTED Final   Enterococcus Faecium NOT DETECTED NOT DETECTED Final   Listeria monocytogenes NOT DETECTED NOT DETECTED Final   Staphylococcus species DETECTED (A) NOT DETECTED Final    Comment: CRITICAL RESULT CALLED TO, READ BACK BY AND VERIFIED WITH: PHARMD FRANK ABBOTT ON 02/15/22 @ 2314 BY DRT    Staphylococcus aureus (BCID) DETECTED (A) NOT DETECTED Final    Comment: Methicillin (oxacillin)-resistant Staphylococcus aureus (MRSA). MRSA is predictably resistant to beta-lactam antibiotics (except ceftaroline). Preferred therapy is vancomycin unless clinically contraindicated. Patient requires contact precautions if  hospitalized. CRITICAL RESULT CALLED TO, READ BACK BY AND VERIFIED WITH: PHARMD FRANK ABBOTT ON 02/15/22 @ [id number] BY DRT    Staphylococcus epidermidis NOT DETECTED NOT DETECTED Final   Staphylococcus lugdunensis NOT DETECTED NOT DETECTED Final   Streptococcus species NOT DETECTED NOT DETECTED Final   Streptococcus agalactiae NOT DETECTED NOT DETECTED Final   Streptococcus pneumoniae NOT DETECTED NOT DETECTED Final   Streptococcus pyogenes NOT DETECTED NOT DETECTED Final   A.calcoaceticus-baumannii NOT DETECTED NOT DETECTED Final   Bacteroides fragilis NOT DETECTED NOT DETECTED Final   Enterobacterales NOT DETECTED NOT DETECTED Final   Enterobacter cloacae complex NOT DETECTED NOT DETECTED Final   Escherichia coli NOT  DETECTED NOT DETECTED Final   Klebsiella aerogenes NOT DETECTED NOT DETECTED Final   Klebsiella oxytoca NOT DETECTED NOT DETECTED Final   Klebsiella pneumoniae NOT DETECTED NOT DETECTED Final   Proteus species NOT DETECTED NOT DETECTED Final   Salmonella species NOT DETECTED NOT DETECTED Final   Serratia marcescens NOT DETECTED NOT DETECTED Final   Haemophilus influenzae NOT DETECTED NOT DETECTED Final   Neisseria meningitidis NOT DETECTED NOT DETECTED Final   Pseudomonas aeruginosa NOT DETECTED NOT DETECTED Final   Stenotrophomonas maltophilia NOT DETECTED NOT DETECTED Final   Candida albicans NOT DETECTED NOT DETECTED Final   Candida auris NOT DETECTED NOT DETECTED Final   Candida glabrata NOT DETECTED NOT DETECTED Final   Candida krusei NOT DETECTED NOT DETECTED Final   Candida parapsilosis NOT DETECTED NOT DETECTED Final   Candida tropicalis NOT DETECTED NOT DETECTED Final   Cryptococcus neoformans/gattii NOT DETECTED NOT DETECTED Final   Meth resistant mecA/C and MREJ DETECTED (A) NOT DETECTED Final    Comment: CRITICAL RESULT CALLED TO, READ BACK BY AND VERIFIED WITH: PHARMD FRANK ABBOTT ON 02/15/22 @ [id number] BY DRT Performed at [facility], [address], [place], [place] [address]   Aerobic Culture w Gram Stain (superficial specimen)     Status: None   Collection Time: 02/15/22 10:20 AM   Specimen: KNEE  Result Value Ref Range Status   Specimen Description KNEE  Final   Special Requests NONE  Final   Gram Stain   Final    NO WBC SEEN RARE GRAM POSITIVE COCCI Performed at [facility], [place] [address] [place], [place] [address]    Culture   Final    MODERATE METHICILLIN RESISTANT STAPHYLOCOCCUS AUREUS   Report Status 02/17/2022 FINAL  Final   Organism ID, Bacteria METHICILLIN RESISTANT STAPHYLOCOCCUS AUREUS  Final      Susceptibility   Methicillin resistant staphylococcus aureus - MIC*    CIPROFLOXACIN >=8 RESISTANT Resistant     ERYTHROMYCIN >=8 RESISTANT  Resistant     GENTAMICIN <=0.5 SENSITIVE Sensitive     OXACILLIN >=4 RESISTANT Resistant     TETRACYCLINE <=1  SENSITIVE Sensitive     VANCOMYCIN 1 SENSITIVE Sensitive     TRIMETH/SULFA >=320 RESISTANT Resistant     CLINDAMYCIN >=8 RESISTANT Resistant     RIFAMPIN <=0.5 SENSITIVE Sensitive     Inducible Clindamycin NEGATIVE Sensitive     * MODERATE METHICILLIN RESISTANT STAPHYLOCOCCUS AUREUS  Culture, blood (Routine X 2) w Reflex to ID Panel     Status: None (Preliminary result)   Collection Time: 02/17/22  6:29 AM   Specimen: BLOOD  Result Value Ref Range Status   Specimen Description BLOOD RIGHT ANTECUBITAL  Final   Special Requests   Final    BOTTLES DRAWN AEROBIC AND ANAEROBIC Blood Culture adequate volume   Culture   Final    NO GROWTH < 24 HOURS Performed at [facility], [address] [place], [place] [address]    Report Status PENDING  Incomplete  Culture, blood (Routine X 2) w Reflex to ID Panel     Status: None (Preliminary result)   Collection Time: 02/17/22  6:30 AM   Specimen: BLOOD  Result Value Ref Range Status   Specimen Description BLOOD LEFT ANTECUBITAL  Final   Special Requests   Final    BOTTLES DRAWN AEROBIC AND ANAEROBIC Blood Culture adequate volume   Culture   Final    NO GROWTH < 24 HOURS Performed at [facility], [address] [place], [place] [address]    Report Status PENDING  Incomplete  Surgical pcr screen     Status: Abnormal   Collection Time: 02/17/22 11:30 AM   Specimen: Nasal Mucosa; Nasal Swab  Result Value Ref Range Status   MRSA, PCR POSITIVE (A) NEGATIVE Final   Staphylococcus aureus POSITIVE (A) NEGATIVE Final    Comment: RESULT CALLED TO, READ BACK BY AND VERIFIED WITH: 02/17/2022 @ 1605 RN ANGELITO, ADC (NOTE) The Xpert SA Assay (FDA approved for NASAL specimens in patients 27 years of age and older), is one component of a comprehensive surveillance program. It is not intended to diagnose infection nor to guide or  monitor treatment. Performed at [facility], [address], [place], [place] [address]   Aerobic/Anaerobic Culture w Gram Stain (surgical/deep wound)     Status: None (Preliminary result)   Collection Time: 02/17/22  6:05 PM   Specimen: Abscess  Result Value Ref Range Status   Specimen Description ABSCESS LEFT KNEE  Final   Special Requests SPECIMEN A  Final   Gram Stain   Final    MODERATE WBC PRESENT,BOTH PMN AND MONONUCLEAR FEW GRAM POSITIVE COCCI IN CLUSTERS RARE GRAM POSITIVE COCCI IN PAIRS    Culture   Final    TOO YOUNG TO READ Performed at [facility], [address] [place], [place] [address]    Report Status PENDING  Incomplete  Aerobic/Anaerobic Culture w Gram Stain (surgical/deep wound)     Status: None (Preliminary result)   Collection Time: 02/17/22  6:05 PM   Specimen: Abscess  Result Value Ref Range Status   Specimen Description ABSCESS LEFT KNEE  Final   Special Requests SPECIMEN B  Final   Gram Stain   Final    FEW WBC PRESENT,BOTH PMN AND MONONUCLEAR RARE GRAM POSITIVE COCCI IN PAIRS    Culture   Final    TOO YOUNG TO READ Performed at [facility], [address] [place], [place] [address]    Report Status PENDING  Incomplete     Rexene Alberts, MSN,  NP-C [place] [facility] [email or web address] Pager: [phone] Office: [phone] [facility] Main Line: [place] Communication Welcome

## 2022-02-18 NOTE — Anesthesia Postprocedure Evaluation (Signed)
Anesthesia Post Note  Patient: Miking Usrey  Procedure(s) Performed: TRANSESOPHAGEAL ECHOCARDIOGRAM (TEE)     Patient location during evaluation: Endoscopy Anesthesia Type: MAC Level of consciousness: awake and alert Pain management: pain level controlled Vital Signs Assessment: post-procedure vital signs reviewed and stable Respiratory status: spontaneous breathing, nonlabored ventilation, respiratory function stable and patient connected to nasal cannula oxygen Cardiovascular status: blood pressure returned to baseline and stable Postop Assessment: no apparent nausea or vomiting Anesthetic complications: no   No notable events documented.  Last Vitals:  Vitals:   02/18/22 1123 02/18/22 1439  BP: (!) 125/108 126/84  Pulse: (!) 54 76  Resp: 14 19  Temp: 36.5 C 36.7 C  SpO2: 100% 100%    Last Pain:  Vitals:   02/18/22 1846  TempSrc:   PainSc: 7                  Dezmin Kittelson L Ruchi Stoney

## 2022-02-18 NOTE — Anesthesia Preprocedure Evaluation (Addendum)
Anesthesia Evaluation  Patient identified by MRN, date of birth, ID band Patient awake    Reviewed: Allergy & Precautions, NPO status , Patient's Chart, lab work & pertinent test results  Airway Mallampati: II  TM Distance: >3 FB Neck ROM: Full    Dental  (+) Edentulous Lower, Edentulous Upper, Dental Advisory Given   Pulmonary Current Smoker and Patient abstained from smoking.,    Pulmonary exam normal breath sounds clear to auscultation       Cardiovascular negative cardio ROS Normal cardiovascular exam Rhythm:Regular Rate:Normal  TTE 2023 1. Redundant MV chordae noted.  2. Left ventricular ejection fraction, by estimation, is 60 to 65%. The  left ventricle has normal function. The left ventricle has no regional  wall motion abnormalities. Left ventricular diastolic parameters were  normal.  3. Right ventricular systolic function is normal. The right ventricular  size is normal. Tricuspid regurgitation signal is inadequate for assessing  PA pressure.  4. The mitral valve is normal in structure. No evidence of mitral valve  regurgitation. No evidence of mitral stenosis.  5. The aortic valve is tricuspid. Aortic valve regurgitation is trivial.  No aortic stenosis is present.  6. There is borderline dilatation of the aortic root, measuring 38 mm.  7. The inferior vena cava is normal in size with greater than 50%  respiratory variability, suggesting right atrial pressure of 3 mmHg.    Neuro/Psych negative neurological ROS  negative psych ROS   GI/Hepatic negative GI ROS, (+)     substance abuse  marijuana use and methamphetamine use,   Endo/Other  negative endocrine ROS  Renal/GU negative Renal ROS  negative genitourinary   Musculoskeletal negative musculoskeletal ROS (+)   Abdominal   Peds  Hematology negative hematology ROS (+)   Anesthesia Other Findings MRSA bacteremia  Reproductive/Obstetrics                             Anesthesia Physical Anesthesia Plan  ASA: 2  Anesthesia Plan: MAC   Post-op Pain Management:    Induction: Intravenous  PONV Risk Score and Plan: Propofol infusion and Treatment may vary due to age or medical condition  Airway Management Planned: Natural Airway  Additional Equipment:   Intra-op Plan:   Post-operative Plan:   Informed Consent: I have reviewed the patients History and Physical, chart, labs and discussed the procedure including the risks, benefits and alternatives for the proposed anesthesia with the patient or authorized representative who has indicated his/her understanding and acceptance.     Dental advisory given  Plan Discussed with: CRNA  Anesthesia Plan Comments:         Anesthesia Quick Evaluation

## 2022-02-18 NOTE — Consult Note (Signed)
Reason for Consult:facial abscess Referring Physician: Dr. Redge Gainer Carbajal is an 46 y.o. male.   HPI: This is a 46 year old male with a history of methamphetamine use, skin picking behavior, recurrent MRSA infections who presented originally with left knee pain and several areas of skin and soft tissue infections.  He was started on broad-spectrum antibiotics and was narrowed to vancomycin.  He was seen by orthopedics and underwent left knee I&D on 02/15/2022.  Blood cultures have grown MRSA.  He was taken to the OR on 910 and was noted to have deep space abscess without joint involvement.  The lesion on his face continued to enlarge over the weekend and plastic surgery was consulted for evaluation.  Upon my evaluation today the patient notes that it has significantly worsened over the last several days.  He denies any significant pain to the site.  He notes he continues to receive vancomycin for his MRSA infection.   Past Medical History:  Diagnosis Date   Infection of skin due to methicillin resistant Staphylococcus aureus (MRSA)    Polysubstance abuse (HCC)    Tobacco dependence     Past Surgical History:  Procedure Laterality Date   ABDOMINAL SURGERY     HAND SURGERY     I & D EXTREMITY Right 12/19/2021   Procedure: IRRIGATION AND DEBRIDEMENT EXTREMITY;  Surgeon: Gomez Cleverly, MD;  Location: [facility] OR;  Service: Orthopedics;  Laterality: Right;   IRRIGATION AND DEBRIDEMENT KNEE Left 02/17/2022   Procedure: IRRIGATION AND DEBRIDEMENT KNEE;  Surgeon: Bjorn Pippin, MD;  Location: [facility] OR;  Service: Orthopedics;  Laterality: Left;    History reviewed. No pertinent family history.  Social History:  reports that he has been smoking cigarettes. He does not have any smokeless tobacco history on file. He reports that he does not currently use alcohol. He reports current drug use. Drugs: Marijuana and Methamphetamines.  Allergies:  Allergies  Allergen Reactions   Penicillins Other (See  Comments)    Childhood allergy    Medications:   Results for orders placed or performed during the hospital encounter of 02/14/22 (from the past 48 hour(s))  Culture, blood (Routine X 2) w Reflex to ID Panel     Status: None (Preliminary result)   Collection Time: 02/17/22  6:29 AM   Specimen: BLOOD  Result Value Ref Range   Specimen Description BLOOD RIGHT ANTECUBITAL    Special Requests      BOTTLES DRAWN AEROBIC AND ANAEROBIC Blood Culture adequate volume   Culture      NO GROWTH < 24 HOURS Performed at [facility], [address], [place], [place] [address]    Report Status PENDING   CBC with Differential/Platelet     Status: Abnormal   Collection Time: 02/17/22  6:30 AM  Result Value Ref Range   WBC 13.3 (H) 4.0 - 10.5 K/uL   RBC 3.51 (L) 4.22 - 5.81 MIL/uL   Hemoglobin 11.7 (L) 13.0 - 17.0 g/dL   HCT 56.2 (L) 13.0 - 86.5 %   MCV 95.4 80.0 - 100.0 fL   MCH 33.3 26.0 - 34.0 pg   MCHC 34.9 30.0 - 36.0 g/dL   RDW 78.4 69.6 - 29.5 %   Platelets 277 150 - 400 K/uL   nRBC 0.0 0.0 - 0.2 %   Neutrophils Relative % 74 %   Neutro Abs 9.8 (H) 1.7 - 7.7 K/uL   Lymphocytes Relative 13 %   Lymphs Abs 1.7 0.7 - 4.0 K/uL  Monocytes Relative 9 %   Monocytes Absolute 1.2 (H) 0.1 - 1.0 K/uL   Eosinophils Relative 3 %   Eosinophils Absolute 0.4 0.0 - 0.5 K/uL   Basophils Relative 1 %   Basophils Absolute 0.1 0.0 - 0.1 K/uL   Immature Granulocytes 0 %   Abs Immature Granulocytes 0.04 0.00 - 0.07 K/uL    Comment: Performed at [facility], [address], [place], [place] [address]  Culture, blood (Routine X 2) w Reflex to ID Panel     Status: None (Preliminary result)   Collection Time: 02/17/22  6:30 AM   Specimen: BLOOD  Result Value Ref Range   Specimen Description BLOOD LEFT ANTECUBITAL    Special Requests      BOTTLES DRAWN AEROBIC AND ANAEROBIC Blood Culture adequate volume   Culture      NO GROWTH < 24 HOURS Performed at [facility], [address] [place], [place] [address]    Report Status PENDING   HIV Antibody (routine testing w rflx)     Status: None   Collection Time: 02/17/22  6:30 AM  Result Value Ref Range   HIV Screen 4th Generation wRfx Non Reactive Non Reactive    Comment: (NOTE) HIV Negative HIV-1/HIV-2 antibodies and HIV-1 p24 antigen were NOT detected. There is no laboratory evidence of HIV infection. Performed At: [facility] [address] [place], [place] [id number] Jolene Schimke MD WG:[id number]   Surgical pcr screen     Status: Abnormal   Collection Time: 02/17/22 11:30 AM   Specimen: Nasal Mucosa; Nasal Swab  Result Value Ref Range   MRSA, PCR POSITIVE (A) NEGATIVE   Staphylococcus aureus POSITIVE (A) NEGATIVE    Comment: RESULT CALLED TO, READ BACK BY AND VERIFIED WITH: 02/17/2022 @ 1605 RN ANGELITO, ADC (NOTE) The Xpert SA Assay (FDA approved for NASAL specimens in patients 4 years of age and older), is one component of a comprehensive surveillance program. It is not intended to diagnose infection nor to guide or monitor treatment. Performed at [facility], [address] [place], [place] [address]   Aerobic/Anaerobic Culture w Gram Stain (surgical/deep wound)     Status: None (Preliminary result)   Collection Time: 02/17/22  6:05 PM   Specimen: Abscess  Result Value Ref Range   Specimen Description ABSCESS LEFT KNEE    Special Requests SPECIMEN A    Gram Stain      MODERATE WBC PRESENT,BOTH PMN AND MONONUCLEAR FEW GRAM POSITIVE COCCI IN CLUSTERS RARE GRAM POSITIVE COCCI IN PAIRS    Culture      TOO YOUNG TO READ Performed at [facility], [address], [place], [place] [address]    Report Status PENDING   Aerobic/Anaerobic Culture w Gram Stain (surgical/deep wound)     Status: None (Preliminary result)   Collection Time: 02/17/22  6:05 PM   Specimen: Abscess  Result Value Ref Range   Specimen Description ABSCESS LEFT KNEE    Special Requests SPECIMEN B     Gram Stain      FEW WBC PRESENT,BOTH PMN AND MONONUCLEAR RARE GRAM POSITIVE COCCI IN PAIRS    Culture      TOO YOUNG TO READ Performed at [facility], [address], [place], [place] [address]    Report Status PENDING   Urinalysis, Routine w reflex microscopic Urine, Clean Catch     Status: Abnormal   Collection Time: 02/17/22  9:08 PM  Result Value Ref Range   Color, Urine STRAW (A) YELLOW   APPearance CLEAR CLEAR   Specific Gravity, Urine 1.009 1.005 - 1.030   pH 6.0 5.0 - 8.0   Glucose, UA NEGATIVE NEGATIVE mg/dL   Hgb urine dipstick SMALL (A) NEGATIVE   Bilirubin Urine NEGATIVE NEGATIVE   Ketones, ur NEGATIVE NEGATIVE mg/dL   Protein, ur NEGATIVE NEGATIVE mg/dL   Nitrite NEGATIVE NEGATIVE   Leukocytes,Ua NEGATIVE NEGATIVE   RBC / HPF 0-5 0 - 5 RBC/hpf   WBC, UA 0-5 0 - 5 WBC/hpf   Bacteria, UA NONE SEEN NONE SEEN    Comment: Performed at [facility], [address] [place], [place] [address]    VAS [place] LOWER EXTREMITY VENOUS (DVT)  Result Date: 02/18/2022  Lower Venous DVT Study Patient Name:  ELSWORTH Woods  Date of Exam:   02/18/2022 Medical Rec #: [id number]        Accession #:    [id number] Date of Birth: 1975-08-03        Patient Gender: M Patient Age:   52 years Exam Location:  [facility] Procedure:      VAS [place] LOWER EXTREMITY VENOUS (DVT) Referring Phys: Hazeline Junker --------------------------------------------------------------------------------  Indications: Edema.  Comparison Study: NO PRIOR Performing Technologist: [place] Ponder RVS  Examination Guidelines: A complete evaluation includes B-mode imaging, spectral Doppler, color Doppler, and power Doppler as needed of all accessible portions of each vessel. Bilateral testing is considered an integral part of a complete examination. Limited examinations for reoccurring indications may be performed as noted. The reflux portion of the exam is performed with the patient in reverse Trendelenburg.   +-----+---------------+---------+-----------+----------+--------------+ RIGHTCompressibilityPhasicitySpontaneityPropertiesThrombus Aging +-----+---------------+---------+-----------+----------+--------------+ CFV  Full           Yes      Yes                                 +-----+---------------+---------+-----------+----------+--------------+ SFJ  Full                                                        +-----+---------------+---------+-----------+----------+--------------+   +---------+---------------+---------+-----------+----------+--------------+ LEFT     CompressibilityPhasicitySpontaneityPropertiesThrombus Aging +---------+---------------+---------+-----------+----------+--------------+ CFV      Full           Yes      Yes                                 +---------+---------------+---------+-----------+----------+--------------+ SFJ      Full                                                        +---------+---------------+---------+-----------+----------+--------------+ FV Prox  Full                                                        +---------+---------------+---------+-----------+----------+--------------+ FV Mid   Full                                                        +---------+---------------+---------+-----------+----------+--------------+  FV DistalFull                                                        +---------+---------------+---------+-----------+----------+--------------+ PFV      Full                                                        +---------+---------------+---------+-----------+----------+--------------+ POP      Full           Yes      Yes                                 +---------+---------------+---------+-----------+----------+--------------+ PTV      Full                                                        +---------+---------------+---------+-----------+----------+--------------+  PERO     Full                                                        +---------+---------------+---------+-----------+----------+--------------+     Summary: RIGHT: - No evidence of common femoral vein obstruction.  LEFT: - There is no evidence of deep vein thrombosis in the lower extremity.  - No cystic structure found in the popliteal fossa.  *See table(s) above for measurements and observations.    Preliminary    ECHO TEE  Result Date: 02/18/2022    TRANSESOPHOGEAL ECHO REPORT   Patient Name:   SABIR CHARTERS Date of Exam: 02/18/2022 Medical Rec #:  [id number]       Height:       72.0 in Accession #:    [id number]      Weight:       170.0 lb Date of Birth:  [birth year]       BSA:          1.988 m Patient Age:    45 years        BP:           130/86 mmHg Patient Gender: M               HR:           76 bpm. Exam Location:  Inpatient Procedure: Transesophageal Echo, Color Doppler and Cardiac Doppler Indications:     Endocarditis  History:         Patient has prior history of Echocardiogram examinations, most                  recent 02/15/2022. Risk Factors:Current Smoker and Polysubstance                  abuse.  Sonographer:     Eulah Pont RDCS Referring Phys:  [id number] Kelton Pillar GOODRICH Diagnosing Phys: Zoila Shutter  MD PROCEDURE: After discussion of the risks and benefits of a TEE, an informed consent was obtained from the patient. The transesophogeal probe was passed without difficulty through the esophogus of the patient. Sedation performed by different physician. The patient was monitored while under deep sedation. Anesthestetic sedation was provided intravenously by Anesthesiology: 231.3mg  of Propofol. The patient's vital signs; including heart rate, blood pressure, and oxygen saturation; remained stable throughout the procedure. The patient developed no complications during the procedure. IMPRESSIONS  1. Left ventricular ejection fraction, by estimation, is 60 to 65%. The left ventricle has normal  function.  2. Right ventricular systolic function is normal. The right ventricular size is normal.  3. No left atrial/left atrial appendage thrombus was detected.  4. The mitral valve is grossly normal. Trivial mitral valve regurgitation.  5. The aortic valve is tricuspid. Aortic valve regurgitation is not visualized. Conclusion(s)/Recommendation(s): No evidence of vegetation/infective endocarditis on this transesophageael echocardiogram. FINDINGS  Left Ventricle: Left ventricular ejection fraction, by estimation, is 60 to 65%. The left ventricle has normal function. The left ventricular internal cavity size was normal in size. There is no left ventricular hypertrophy. Right Ventricle: The right ventricular size is normal. No increase in right ventricular wall thickness. Right ventricular systolic function is normal. Left Atrium: Left atrial size was normal in size. No left atrial/left atrial appendage thrombus was detected. Right Atrium: Right atrial size was normal in size. Pericardium: There is no evidence of pericardial effusion. Mitral Valve: The mitral valve is grossly normal. Trivial mitral valve regurgitation. Tricuspid Valve: The tricuspid valve is grossly normal. Tricuspid valve regurgitation is trivial. Aortic Valve: The aortic valve is tricuspid. Aortic valve regurgitation is not visualized. Pulmonic Valve: The pulmonic valve was grossly normal. Pulmonic valve regurgitation is trivial. Aorta: The aortic root and ascending aorta are structurally normal, with no evidence of dilitation. IAS/Shunts: No atrial level shunt detected by color flow Doppler. Zoila Shutter MD Electronically signed by Zoila Shutter MD Signature Date/Time: 02/18/2022/11:37:13 AM    Final    MR KNEE LEFT W WO CONTRAST  Result Date: 02/17/2022 CLINICAL DATA:  Knee redness, pain, swelling. Septic arthritis suspected, knee, xray done EXAM: MRI OF THE LEFT KNEE WITHOUT AND WITH CONTRAST TECHNIQUE: Multiplanar, multisequence MR imaging  of the left knee was performed both before and after administration of intravenous contrast. CONTRAST:  7.68mL GADAVIST GADOBUTROL 1 MMOL/ML IV SOLN COMPARISON:  X-ray 02/15/2022 FINDINGS: MENISCI Medial meniscus:  Intact. Lateral meniscus:  Intact. LIGAMENTS Cruciates: Intact ACL and PCL. Collaterals: Intact MCL. Lateral collateral ligament complex intact. CARTILAGE Patellofemoral: Minimal cartilage surface irregularity along the patellar apex inferiorly. No trochlear cartilage defect. Medial:  No chondral defect. Lateral:  No chondral defect. MISCELLANEOUS Joint: Small joint effusion without synovitis. Fat pads within normal limits. Popliteal Fossa:  No Baker's cyst. Intact popliteus tendon. Extensor Mechanism:  Intact quadriceps and patellar tendons. Bones: No acute fracture. No dislocation. No bone marrow edema. No periostitis. No erosion. No marrow replacing bone lesion. Other: Prepatellar soft tissue wound with underlying complex fluid collection with partial rim enhancement. Approximate measurements of 6.0 x 1.0 x 3.0 cm (series 19, image 14). Extensive subcutaneous edema and enhancement throughout the soft tissues of the lower extremity centered at the knee anteriorly. There is deep fascial edema and enhancement. Mild intramuscular edema within the superficial aspects of the distal vastus lateralis and vastus medialis muscles. IMPRESSION: 1. Prepatellar soft tissue wound with underlying complex fluid collection with partial rim enhancement measuring approximately 6 x 1 x 3  cm compatible with phlegmon/developing abscess. 2. Extensive findings of cellulitis and myofasciitis of the imaged lower extremity. 3. Small joint effusion without synovitis, favored reactive. Septic arthritis is not entirely excluded and arthrocentesis with joint fluid analysis should be considered. 4. No evidence of osteomyelitis. 5. Intact menisci.  Intact cruciate and collateral ligaments. Electronically Signed   By: Duanne GuessNicholas  Plundo  D.O.   On: 02/17/2022 09:09    ROS Positive for wound Blood pressure 126/84, pulse 76, temperature 98 F (36.7 C), resp. rate 19, height 6' 0.01" (1.829 m), weight 77.1 kg, SpO2 100 %. Physical Exam Resting comfortably in exam bed.  Left knee wrapped in bandaging.  Forehead with multiple lesions with a large area of redness and underlying fluctuance measuring approximately 4 cm x 4 cm, no active drainage      Incision and Drainage Procedure Note  Pre-operative Diagnosis: Facial abscess  Post-operative Diagnosis: Facial abscess  Indications: Facial abscess  Anesthesia: 1% lidocaine with epinephrine  Procedure Details  The procedure, risks and complications have been discussed in detail (including, but not limited to airway compromise, infection, bleeding) with the patient, and his mother who is at bedside.  The patient gave verbal consent to proceed with incision and drainage.  The skin was sterilely prepped in the usual fashion. After adequate local anesthesia I&D was performed with an 11 blade over the left forehead dissecting down through the skin, I encountered a large amount of purulent discharge approximately 5 cc.  This was cultured.  The wound was copiously irrigated with normal saline.  I applied Xeroform dressing over the top followed by gauze and Curlex.  Findings: 5 cc of purulent discharge  EBL: 1 cc's  Drains: None  Condition: Tolerated procedure well   Complications: none.    Assessment/Plan: This is a 46 year old gentleman seen in consultation for facial abscess.  He had an area of fluctuance overlying cellulitis.  After a discussion of risk and benefits I did perform an I&D on him.  He tolerated this very well.  The wound was dressed with Xeroform and gauze over the top as well as Curlex.  We would recommend daily dressing changes with gauze and Curlex.  No drains were placed.  We will continue to follow the patient, please feel free to reach out with any  questions or concerns as it relates to his facial abscess.  Recommend continuing antibiotics per infectious disease.  Kelle DartingJeffrey Todd Zayna Toste 02/18/2022, 3:53 PM    en

## 2022-02-18 NOTE — Plan of Care (Signed)

## 2022-02-19 LAB — BASIC METABOLIC PANEL
Anion gap: 14 (ref 5–15)
BUN: 8 mg/dL (ref 6–20)
CO2: 24 mmol/L (ref 22–32)
Calcium: 8.4 mg/dL — ABNORMAL LOW (ref 8.9–10.3)
Chloride: 102 mmol/L (ref 98–111)
Creatinine, Ser: 0.77 mg/dL (ref 0.61–1.24)
GFR, Estimated: >60 mL/min (ref >60)
Glucose, Bld: 119 mg/dL — ABNORMAL HIGH (ref 70–99)
Potassium: 3.3 mmol/L — ABNORMAL LOW (ref 3.5–5.1)
Sodium: 140 mmol/L (ref 135–145)

## 2022-02-19 LAB — VANCOMYCIN, PEAK: Vancomycin Pk: 28 ug/mL — ABNORMAL LOW (ref 30–40)

## 2022-02-19 LAB — CK: Total CK: 55 U/L (ref 49–397)

## 2022-02-19 LAB — HEPATITIS A ANTIBODY, TOTAL: hep A Total Ab: POSITIVE — AB

## 2022-02-19 LAB — HEPATITIS B SURFACE ANTIGEN

## 2022-02-19 MED ORDER — SODIUM CHLORIDE 0.9 % IV SOLN
8.0000 mg/kg | Freq: Every day | INTRAVENOUS | Status: DC
  Administered 2022-02-19 – 2022-03-04 (×14): 600 mg via INTRAVENOUS
  Filled 2022-02-19 (×16): qty 12

## 2022-02-19 NOTE — Progress Notes (Signed)
Pharmacy Antibiotic Note  Eric Woods is a 46 y.o. male admitted on 02/14/2022 with cellulitis and now concern for MRSA bacteremia with positive BCID. S/p knee I&D on 9/8 and 9/10 and forehead abscess I&D 9/11.MRSA also growing in 9/8 OR Cx of knee. (/11 OR cx with Rare GPCs in pairs and singles.   Pharmacy has been consulted for daptomycin dosing.  Plan: START daptomycin 600 mg IV (8 mg/kg TBW) Q2000 STOP Vancomycin following 9/12 AM dose  F/u micro data, renal function and length of therapy CK Qtues   Height: 6' 0.01" (182.9 cm) Weight: 77.1 kg (170 lb) IBW/kg (Calculated) : 77.62  Temp (24hrs), Avg:98.2 F (36.8 C), Min:97.6 F (36.4 C), Max:99 F (37.2 C)  Recent Labs  Lab 02/15/22 0458 02/16/22 0050 02/17/22 0630 02/19/22 0143  WBC 15.9* 16.8* 13.3*  --   CREATININE 1.11 0.83  --  0.77  LATICACIDVEN 1.1  --   --   --   VANCOPEAK  --   --   --  28*     Estimated Creatinine Clearance: 127.2 mL/min (by C-G formula based on SCr of 0.77 mg/dL).    Allergies  Allergen Reactions   Penicillins Other (See Comments)    Childhood allergy    Antimicrobials this admission: vanc 9/8 >> 9/12 Daptomycin 9/12>>c  Microbiology: 9/8 Bcx: MRSA 9/8 knee cx: MRSA 9/8 BCID: MRSA  9/10 knee cx: Rare GPCs in pairs, Few GPCs in clusters  9/11 forehead cx: few GPCs in pairs in singles   Thank you for allowing pharmacy to participate in this patient's care.  Jani Gravel, PharmD PGY-2 Infectious Diseases Resident  02/19/2022 9:54 AM

## 2022-02-19 NOTE — Progress Notes (Signed)
Progress Note  Patient: Eric Woods SWN:[id number] DOB: 07-07-1975  DOA: 02/14/2022  DOS: 02/19/2022    Brief hospital course: Eric Woods is a 46 y.o. male with a history of methamphetamine use, incarceration, skin picking behavior, recurrent MRSA infections who presented with left knee pain and several areas of skin and soft tissue infections. Broad IV antibiotics were started, narrowed to vancomycin and the patient was admitted. Orthopedics consulted, performed bedside left knee I&D 9/8. Blood cultures have grown MRSA. TTE and then subsequent TEE without vegetation. Left knee washout in OR performed 9/10 noting deep abscess without joint involvement. Also underwent bedside I&D of left forehead abscess by plastics on 9/11. Continues on IV antibiotics, vancomycin switched to daptomycin on 9/12. Repeat blood cultures 9/10 are NGTD.  Assessment and Plan: Multifocal SSTI, MRSA bacteremia: No vegetation on TEE. - Continue IV antibiotics for 6 weeks per ID. Switching vancomycin to daptomycin for ease of dosing. Weekly CK ordered. - Note previous hospitalization for MRSA infection and patient is "unsure" whether he took oral doxycycline after discharge.  - Continue oral analgesic only. - Repeat blood cultures 9/10 sent, NGTD - HIV NR July 2023, RPR NR, HCV and HPV SAg negative/NR. - Trend neutrophilic leukocytosis. Improving.  Left knee cellulitis and abscess: Intraarticular signal more consistent with reactive change, not suspected to be joint involvement per orthopedics.  - Orthopedics, Dr. Everardo Pacific, performed operative I&D 9/10. Cultures still listed as TYTR though gram stain with GPC in pairs and GPC in clusters.  - Initial wound Cx grew MRSA.  - Distal LLE swelling improving, negative venous U/S for DVT.    Left forehead cellulitis and abscess: s/p bedside I&D 9/11 by plastics with 5cc purulent discharge collected, culture currently reincubated, +GPC in pairs and singles on gram stain.    Left preseptal cellulitis: Significantly improved. Was discussed with ophthalmology, Dr. Allena Katz on 9/9  Polysubstance abuse: UDS +amphetamine, THC.  - Cessation counseling provided. Consider re-enrollment in treatment program  - Precludes discharge with PICC line in place. - TOC consulted  Tobacco use:  - Cessation counseling provided  Hyponatremia: Mild  Hypokalemia: Mild. - Repeat supplementation  Urinary hesitancy: UA negative. Symptoms currently resolved. - Monitor clinically/prn bladder scan.   Subjective: Pain is stable, no new complaints. Tolerated forehead I&D. Still had drenching night sweat last night. No chest pain, dyspnea, palpitations.   Objective: Vitals:   02/18/22 1439 02/18/22 2154 02/19/22 0341 02/19/22 0935  BP: 126/84 131/88 123/75 127/73  Pulse: 76 87 77 72  Resp: 19 18 19 16   Temp: 98 F (36.7 C) 99 F (37.2 C) 99 F (37.2 C) 98.7 F (37.1 C)  TempSrc:  Oral  Oral  SpO2: 100% 100% 100% 99%  Weight:      Height:      Gen: 46 y.o. male in no distress Pulm: Nonlabored breathing room air. Clear. CV: Regular rate and rhythm. No murmur, rub, or gallop. No JVD, improved LLE dependent edema. GI: Abdomen soft, non-tender, non-distended, with normoactive bowel sounds.  Ext: Warm, dry Skin: Left forehead wound with dried exudate/sanguinous discharge on dressing, decreased size. Left knee dressing c/d/I. No new rashes, lesions or ulcers on visualized skin. Neuro: Alert and oriented. No focal neurological deficits. Psych: Judgement and insight appear fair. Mood euthymic & affect congruent. Behavior is appropriate.    Data Personally reviewed: CBC: Recent Labs  Lab 02/15/22 0458 02/16/22 0050 02/17/22 0630  WBC 15.9* 16.8* 13.3*  NEUTROABS 13.2*  --  9.8*  HGB 14.4 11.4* 11.7*  HCT 41.8 32.9* 33.5*  MCV 97.2 97.3 95.4  PLT 312 262 277   Basic Metabolic Panel: Recent Labs  Lab 02/15/22 0458 02/16/22 0050 02/19/22 0143  NA 132* 131* 140  K  3.4* 3.9 3.3*  CL 95* 97* 102  CO2 27 22 24   GLUCOSE 126* 92 119*  BUN 11 16 8   CREATININE 1.11 0.83 0.77  CALCIUM 9.4 8.5* 8.4*   GFR: Estimated Creatinine Clearance: 127.2 mL/min (by C-G formula based on SCr of 0.77 mg/dL). Liver Function Tests: Recent Labs  Lab 02/15/22 0458  AST 19  ALT 18  ALKPHOS 64  BILITOT 0.7  PROT 7.3  ALBUMIN 3.7    Recent Results (from the past 240 hour(s))  Blood Culture (routine x 2)     Status: Abnormal   Collection Time: 02/15/22  4:58 AM   Specimen: BLOOD LEFT ARM  Result Value Ref Range Status   Specimen Description BLOOD LEFT ARM  Final   Special Requests   Final    BOTTLES DRAWN AEROBIC AND ANAEROBIC Blood Culture results may not be optimal due to an excessive volume of blood received in culture bottles   Culture  Setup Time   Final    GRAM POSITIVE COCCI IN CLUSTERS IN BOTH AEROBIC AND ANAEROBIC BOTTLES CRITICAL RESULT CALLED TO, READ BACK BY AND VERIFIED WITH: PHARMD FRANK ABBOTT ON 02/15/22 @ 2314 BY DRT Performed at [facility], [address], [place], [address] [place]    Culture METHICILLIN RESISTANT STAPHYLOCOCCUS AUREUS (A)  Final   Report Status 02/17/2022 FINAL  Final   Organism ID, Bacteria METHICILLIN RESISTANT STAPHYLOCOCCUS AUREUS  Final      Susceptibility   Methicillin resistant staphylococcus aureus - MIC*    CIPROFLOXACIN >=8 RESISTANT Resistant     ERYTHROMYCIN >=8 RESISTANT Resistant     GENTAMICIN <=0.5 SENSITIVE Sensitive     OXACILLIN >=4 RESISTANT Resistant     TETRACYCLINE <=1 SENSITIVE Sensitive     VANCOMYCIN 1 SENSITIVE Sensitive     TRIMETH/SULFA >=320 RESISTANT Resistant     CLINDAMYCIN >=8 RESISTANT Resistant     RIFAMPIN <=0.5 SENSITIVE Sensitive     Inducible Clindamycin NEGATIVE Sensitive     * METHICILLIN RESISTANT STAPHYLOCOCCUS AUREUS  Blood Culture (routine x 2)     Status: Abnormal   Collection Time: 02/15/22  4:58 AM   Specimen: BLOOD RIGHT FOREARM  Result Value Ref Range Status    Specimen Description BLOOD RIGHT FOREARM  Final   Special Requests   Final    BOTTLES DRAWN AEROBIC AND ANAEROBIC Blood Culture results may not be optimal due to an excessive volume of blood received in culture bottles   Culture  Setup Time   Final    ANAEROBIC BOTTLE ONLY GRAM POSITIVE COCCI IN CLUSTERS CRITICAL VALUE NOTED.  VALUE IS CONSISTENT WITH PREVIOUSLY REPORTED AND CALLED VALUE.    Culture (A)  Final    STAPHYLOCOCCUS AUREUS SUSCEPTIBILITIES PERFORMED ON PREVIOUS CULTURE WITHIN THE LAST 5 DAYS. Performed at [facility], [address], [place], [address] [place]    Report Status 02/17/2022 FINAL  Final  Blood Culture ID Panel (Reflexed)     Status: Abnormal   Collection Time: 02/15/22  4:58 AM  Result Value Ref Range Status   Enterococcus faecalis NOT DETECTED NOT DETECTED Final   Enterococcus Faecium NOT DETECTED NOT DETECTED Final   Listeria monocytogenes NOT DETECTED NOT DETECTED Final   Staphylococcus species DETECTED (A) NOT DETECTED Final  Comment: CRITICAL RESULT CALLED TO, READ BACK BY AND VERIFIED WITH: PHARMD FRANK ABBOTT ON 02/15/22 @ 2314 BY DRT    Staphylococcus aureus (BCID) DETECTED (A) NOT DETECTED Final    Comment: Methicillin (oxacillin)-resistant Staphylococcus aureus (MRSA). MRSA is predictably resistant to beta-lactam antibiotics (except ceftaroline). Preferred therapy is vancomycin unless clinically contraindicated. Patient requires contact precautions if  hospitalized. CRITICAL RESULT CALLED TO, READ BACK BY AND VERIFIED WITH: PHARMD FRANK ABBOTT ON 02/15/22 @ 2314 BY DRT    Staphylococcus epidermidis NOT DETECTED NOT DETECTED Final   Staphylococcus lugdunensis NOT DETECTED NOT DETECTED Final   Streptococcus species NOT DETECTED NOT DETECTED Final   Streptococcus agalactiae NOT DETECTED NOT DETECTED Final   Streptococcus pneumoniae NOT DETECTED NOT DETECTED Final   Streptococcus pyogenes NOT DETECTED NOT DETECTED Final    A.calcoaceticus-baumannii NOT DETECTED NOT DETECTED Final   Bacteroides fragilis NOT DETECTED NOT DETECTED Final   Enterobacterales NOT DETECTED NOT DETECTED Final   Enterobacter cloacae complex NOT DETECTED NOT DETECTED Final   Escherichia coli NOT DETECTED NOT DETECTED Final   Klebsiella aerogenes NOT DETECTED NOT DETECTED Final   Klebsiella oxytoca NOT DETECTED NOT DETECTED Final   Klebsiella pneumoniae NOT DETECTED NOT DETECTED Final   Proteus species NOT DETECTED NOT DETECTED Final   Salmonella species NOT DETECTED NOT DETECTED Final   Serratia marcescens NOT DETECTED NOT DETECTED Final   Haemophilus influenzae NOT DETECTED NOT DETECTED Final   Neisseria meningitidis NOT DETECTED NOT DETECTED Final   Pseudomonas aeruginosa NOT DETECTED NOT DETECTED Final   Stenotrophomonas maltophilia NOT DETECTED NOT DETECTED Final   Candida albicans NOT DETECTED NOT DETECTED Final   Candida auris NOT DETECTED NOT DETECTED Final   Candida glabrata NOT DETECTED NOT DETECTED Final   Candida krusei NOT DETECTED NOT DETECTED Final   Candida parapsilosis NOT DETECTED NOT DETECTED Final   Candida tropicalis NOT DETECTED NOT DETECTED Final   Cryptococcus neoformans/gattii NOT DETECTED NOT DETECTED Final   Meth resistant mecA/C and MREJ DETECTED (A) NOT DETECTED Final    Comment: CRITICAL RESULT CALLED TO, READ BACK BY AND VERIFIED WITH: PHARMD FRANK ABBOTT ON 02/15/22 @ 2314 BY DRT Performed at [facility], [address] [place], [place] [address]   Aerobic Culture w Gram Stain (superficial specimen)     Status: None   Collection Time: 02/15/22 10:20 AM   Specimen: KNEE  Result Value Ref Range Status   Specimen Description KNEE  Final   Special Requests NONE  Final   Gram Stain   Final    NO WBC SEEN RARE GRAM POSITIVE COCCI Performed at [facility], [address] [place], [place] [address]    Culture   Final    MODERATE METHICILLIN RESISTANT STAPHYLOCOCCUS AUREUS   Report  Status 02/17/2022 FINAL  Final   Organism ID, Bacteria METHICILLIN RESISTANT STAPHYLOCOCCUS AUREUS  Final      Susceptibility   Methicillin resistant staphylococcus aureus - MIC*    CIPROFLOXACIN >=8 RESISTANT Resistant     ERYTHROMYCIN >=8 RESISTANT Resistant     GENTAMICIN <=0.5 SENSITIVE Sensitive     OXACILLIN >=4 RESISTANT Resistant     TETRACYCLINE <=1 SENSITIVE Sensitive     VANCOMYCIN 1 SENSITIVE Sensitive     TRIMETH/SULFA >=320 RESISTANT Resistant     CLINDAMYCIN >=8 RESISTANT Resistant     RIFAMPIN <=0.5 SENSITIVE Sensitive     Inducible Clindamycin NEGATIVE Sensitive     * MODERATE METHICILLIN RESISTANT STAPHYLOCOCCUS AUREUS  Culture, blood (  Routine X 2) w Reflex to ID Panel     Status: None (Preliminary result)   Collection Time: 02/17/22  6:29 AM   Specimen: BLOOD  Result Value Ref Range Status   Specimen Description BLOOD RIGHT ANTECUBITAL  Final   Special Requests   Final    BOTTLES DRAWN AEROBIC AND ANAEROBIC Blood Culture adequate volume   Culture   Final    NO GROWTH 2 DAYS Performed at [facility], [address] [place], [place] [address]    Report Status PENDING  Incomplete  Culture, blood (Routine X 2) w Reflex to ID Panel     Status: None (Preliminary result)   Collection Time: 02/17/22  6:30 AM   Specimen: BLOOD  Result Value Ref Range Status   Specimen Description BLOOD LEFT ANTECUBITAL  Final   Special Requests   Final    BOTTLES DRAWN AEROBIC AND ANAEROBIC Blood Culture adequate volume   Culture   Final    NO GROWTH 2 DAYS Performed at [facility], [address] [place], [place] [address]    Report Status PENDING  Incomplete  Surgical pcr screen     Status: Abnormal   Collection Time: 02/17/22 11:30 AM   Specimen: Nasal Mucosa; Nasal Swab  Result Value Ref Range Status   MRSA, PCR POSITIVE (A) NEGATIVE Final   Staphylococcus aureus POSITIVE (A) NEGATIVE Final    Comment: RESULT CALLED TO, READ BACK BY AND VERIFIED  WITH: 02/17/2022 @ 1605 RN ANGELITO, ADC (NOTE) The Xpert SA Assay (FDA approved for NASAL specimens in patients 46 years of age and older), is one component of a comprehensive surveillance program. It is not intended to diagnose infection nor to guide or monitor treatment. Performed at [facility], [address] [place], [place] [address]   Aerobic/Anaerobic Culture w Gram Stain (surgical/deep wound)     Status: None (Preliminary result)   Collection Time: 02/17/22  6:05 PM   Specimen: Abscess  Result Value Ref Range Status   Specimen Description ABSCESS LEFT KNEE  Final   Special Requests SPECIMEN A  Final   Gram Stain   Final    MODERATE WBC PRESENT,BOTH PMN AND MONONUCLEAR FEW GRAM POSITIVE COCCI IN CLUSTERS RARE GRAM POSITIVE COCCI IN PAIRS    Culture   Final    TOO YOUNG TO READ Performed at [facility], [address] [place], [place] [address]    Report Status PENDING  Incomplete  Aerobic/Anaerobic Culture w Gram Stain (surgical/deep wound)     Status: None (Preliminary result)   Collection Time: 02/17/22  6:05 PM   Specimen: Abscess  Result Value Ref Range Status   Specimen Description ABSCESS LEFT KNEE  Final   Special Requests SPECIMEN B  Final   Gram Stain   Final    FEW WBC PRESENT,BOTH PMN AND MONONUCLEAR RARE GRAM POSITIVE COCCI IN PAIRS    Culture   Final    TOO YOUNG TO READ Performed at [facility], [address] [place], [place] [address]    Report Status PENDING  Incomplete  Aerobic Culture w Gram Stain (superficial specimen)     Status: None (Preliminary result)   Collection Time: 02/18/22  3:43 PM   Specimen: Head; Abscess  Result Value Ref Range Status   Specimen Description HEAD  Final   Special Requests Normal  Final   Gram Stain   Final    RARE WBC PRESENT,BOTH PMN AND  MONONUCLEAR FEW GRAM POSITIVE COCCI IN PAIRS IN SINGLES    Culture   Final    CULTURE REINCUBATED FOR BETTER GROWTH Performed at [facility], [address] [place], [place] [address]    Report Status PENDING  Incomplete     VAS [place] LOWER EXTREMITY VENOUS (DVT)  Result Date: 02/18/2022  Lower Venous DVT Study Patient Name:  Eric Woods  Date of Exam:   02/18/2022 Medical Rec #: [id number]        Accession #:    [id number] Date of Birth: 11-09-1975        Patient Gender: M Patient Age:   68 years Exam Location:  [facility] Procedure:      VAS [place] LOWER EXTREMITY VENOUS (DVT) Referring Phys: Hazeline Junker --------------------------------------------------------------------------------  Indications: Edema.  Comparison Study: NO PRIOR Performing Technologist: Argentina Ponder RVS  Examination Guidelines: A complete evaluation includes B-mode imaging, spectral Doppler, color Doppler, and power Doppler as needed of all accessible portions of each vessel. Bilateral testing is considered an integral part of a complete examination. Limited examinations for reoccurring indications may be performed as noted. The reflux portion of the exam is performed with the patient in reverse Trendelenburg.  +-----+---------------+---------+-----------+----------+--------------+ RIGHTCompressibilityPhasicitySpontaneityPropertiesThrombus Aging +-----+---------------+---------+-----------+----------+--------------+ CFV  Full           Yes      Yes                                 +-----+---------------+---------+-----------+----------+--------------+ SFJ  Full                                                        +-----+---------------+---------+-----------+----------+--------------+   +---------+---------------+---------+-----------+----------+--------------+ LEFT     CompressibilityPhasicitySpontaneityPropertiesThrombus Aging +---------+---------------+---------+-----------+----------+--------------+ CFV      Full           Yes      Yes                                  +---------+---------------+---------+-----------+----------+--------------+ SFJ      Full                                                        +---------+---------------+---------+-----------+----------+--------------+ FV Prox  Full                                                        +---------+---------------+---------+-----------+----------+--------------+ FV Mid   Full                                                        +---------+---------------+---------+-----------+----------+--------------+ FV DistalFull                                                        +---------+---------------+---------+-----------+----------+--------------+  PFV      Full                                                        +---------+---------------+---------+-----------+----------+--------------+ POP      Full           Yes      Yes                                 +---------+---------------+---------+-----------+----------+--------------+ PTV      Full                                                        +---------+---------------+---------+-----------+----------+--------------+ PERO     Full                                                        +---------+---------------+---------+-----------+----------+--------------+     Summary: RIGHT: - No evidence of common femoral vein obstruction.  LEFT: - There is no evidence of deep vein thrombosis in the lower extremity.  - No cystic structure found in the popliteal fossa.  *See table(s) above for measurements and observations. Electronically signed by Sherald Hess MD on 02/18/2022 at 4:19:52 PM.    Final    ECHO TEE  Result Date: 02/18/2022    TRANSESOPHOGEAL ECHO REPORT   Patient Name:   Eric Woods Date of Exam: 02/18/2022 Medical Rec #:  [id number]       Height:       72.0 in Accession #:    [id number]      Weight:       170.0 lb Date of Birth:  09-25-1975       BSA:          1.988 m Patient Age:    45  years        BP:           130/86 mmHg Patient Gender: M               HR:           76 bpm. Exam Location:  Inpatient Procedure: Transesophageal Echo, Color Doppler and Cardiac Doppler Indications:     Endocarditis  History:         Patient has prior history of Echocardiogram examinations, most                  recent 02/15/2022. Risk Factors:Current Smoker and Polysubstance                  abuse.  Sonographer:     Eulah Pont RDCS Referring Phys:  [id number] Corrin Parker Diagnosing Phys: Zoila Shutter MD PROCEDURE: After discussion of the risks and benefits of a TEE, an informed consent was obtained from the patient. The transesophogeal probe was passed without difficulty through the esophogus of the patient. Sedation performed by different physician. The patient was monitored while under deep sedation. Anesthestetic  sedation was provided intravenously by Anesthesiology: 231.3mg  of Propofol. The patient's vital signs; including heart rate, blood pressure, and oxygen saturation; remained stable throughout the procedure. The patient developed no complications during the procedure. IMPRESSIONS  1. Left ventricular ejection fraction, by estimation, is 60 to 65%. The left ventricle has normal function.  2. Right ventricular systolic function is normal. The right ventricular size is normal.  3. No left atrial/left atrial appendage thrombus was detected.  4. The mitral valve is grossly normal. Trivial mitral valve regurgitation.  5. The aortic valve is tricuspid. Aortic valve regurgitation is not visualized. Conclusion(s)/Recommendation(s): No evidence of vegetation/infective endocarditis on this transesophageael echocardiogram. FINDINGS  Left Ventricle: Left ventricular ejection fraction, by estimation, is 60 to 65%. The left ventricle has normal function. The left ventricular internal cavity size was normal in size. There is no left ventricular hypertrophy. Right Ventricle: The right ventricular size is normal. No  increase in right ventricular wall thickness. Right ventricular systolic function is normal. Left Atrium: Left atrial size was normal in size. No left atrial/left atrial appendage thrombus was detected. Right Atrium: Right atrial size was normal in size. Pericardium: There is no evidence of pericardial effusion. Mitral Valve: The mitral valve is grossly normal. Trivial mitral valve regurgitation. Tricuspid Valve: The tricuspid valve is grossly normal. Tricuspid valve regurgitation is trivial. Aortic Valve: The aortic valve is tricuspid. Aortic valve regurgitation is not visualized. Pulmonic Valve: The pulmonic valve was grossly normal. Pulmonic valve regurgitation is trivial. Aorta: The aortic root and ascending aorta are structurally normal, with no evidence of dilitation. IAS/Shunts: No atrial level shunt detected by color flow Doppler. Zoila Shutter MD Electronically signed by Zoila Shutter MD Signature Date/Time: 02/18/2022/11:37:13 AM    Final      Family Communication: None at bedside  Disposition: Status is: Inpatient Remains inpatient appropriate because: Continued IV antibiotics for MRSA bacteremia. ID recommends 6 weeks inpatient IV antibiotics from time of left knee operative I&D on 9/10.  Planned Discharge Destination: Home  Eric Nine, MD 02/19/2022 12:07 PM Page by [email or web address]

## 2022-02-19 NOTE — Plan of Care (Signed)
  Problem: Clinical Measurements: Goal: Ability to avoid or minimize complications of infection will improve Outcome: Progressing   Problem: Skin Integrity: Goal: Skin integrity will improve Outcome: Progressing   Problem: Education: Goal: Knowledge of General Education information will improve Description: Including pain rating scale, medication(s)/side effects and non-pharmacologic comfort measures Outcome: Progressing   Problem: Health Behavior/Discharge Planning: Goal: Ability to manage health-related needs will improve Outcome: Progressing   Problem: Clinical Measurements: Goal: Ability to maintain clinical measurements within normal limits will improve Outcome: Progressing   Problem: Activity: Goal: Risk for activity intolerance will decrease Outcome: Progressing

## 2022-02-19 NOTE — Progress Notes (Signed)
   ORTHOPAEDIC PROGRESS NOTE  s/p Procedure(s):  Left knee incision and debridement of deep abscess Left knee prepatellar bursectomy on 02/17/2022  SUBJECTIVE: Patient continues to have pain in the left knee. He reports burning at the top of the knee. He notes it does feel better than he did yesterday morning. Swelling in the left lower leg has improved. Doppler was negative for DVT. Mother at bedside.    OBJECTIVE: PE: Left lower extremity: continues to have significant erythema of the left knee, incision with 3 nylon sutures, packing in place, no fluctuance noted around the incision, lower extremity edema has improved since yesterday, continues to be tender to palpation throughout the left knee, ROM from 0 to about 60 degrees. Distal motor and sensory function intact.   Vitals:   02/19/22 0341 02/19/22 0935  BP: 123/75 127/73  Pulse: 77 72  Resp: 19 16  Temp: 99 F (37.2 C) 98.7 F (37.1 C)  SpO2: 100% 99%   Cultures showing staph aureus  ASSESSMENT: Eric Woods is a 46 y.o. male POD#2  PLAN: Weightbearing: WBAT LLE Insicional and dressing care: Patient to have 3 inches of packing removed daily with daily dressing changes Orthopedic device(s): None Showering: Hold for now VTE prophylaxis: per primary team, no orthopedic contraindications Pain control: per primary team Follow - up plan: approximately 14 days for incision check and suture removal. Dispo: can be discharged at the discretion of the medicine and infectious disease teams. Recommend continuing antibiotics per infectious disease. Contact information:  Dr. Ramond Marrow, Alfonse Alpers, PA-C, After hours and holidays please check [email or web address] for group call information for [facility]   Alfonse Alpers, PA-C 02/18/2022

## 2022-02-19 NOTE — Progress Notes (Signed)
[facility]  Date of Admission:  02/14/2022      Total days of antibiotics 6  Vancomycin 6           ASSESSMENT: Eric Woods is a 46 y.o. male admitted with MRSA bacteremia complicated by left knee septic arthritis (now s/p I&D 9/10 with Dr. Everardo Pacific), forehead abscess (s/p I&D 9/11) and skin infections. Surgical cultures with preliminary GPC in chains/clusters. Likely all MRSA.   Repeat blood cultures preliminarily negative - TEE done 9/11 was negative for any signs of endocarditis. Suspect we can start with 2 weeks of IV treatment from knee surgery and see him back to see if we can convert to oral therapy or give long acting IV then   Methamphetamine use, intranasal. HIV non reactive, HCV Ab non-reactive. HBsAg pending. RPR non-reactive. He ascribes only to intranasal/inhalation use but I would not feel terribly comfortable with PICC line outpatient for him.    PLAN: Continue vancomycin - recommend re-eval in 2 weeks on 9/25  Recommend INP antibiotics over this time w/ h/o substance use Follow pending blood cultures  Follow pending micro from OR Lt knee    Principal Problem:   MRSA bacteremia Active Problems:   Cellulitis   Infection of skin due to methicillin resistant Staphylococcus aureus (MRSA)   Abscess of forehead   Septic arthritis of knee, left (HCC)   Polysubstance abuse (HCC)   Tobacco dependence    acetaminophen  650 mg Oral Q8H   celecoxib  100 mg Oral BID   chlorhexidine  60 mL Topical Once   Chlorhexidine Gluconate Cloth  6 each Topical Q0600   docusate sodium  100 mg Oral BID   enoxaparin (LOVENOX) injection  40 mg Subcutaneous Q24H   lidocaine-EPINEPHrine  20 mL Infiltration Once   mupirocin ointment  1 Application Nasal BID   povidone-iodine  2 Application Topical Once   sodium chloride flush  3 mL Intravenous Q12H    SUBJECTIVE: Had I&D of the forehead abscess yesterday and feeling like it is better. Sore  but smaller.     Review of Systems: Review of Systems  Constitutional:  Negative for chills, fever and malaise/fatigue.  Eyes:  Negative for blurred vision, pain and discharge.  Cardiovascular:  Positive for leg swelling.  Musculoskeletal:  Positive for joint pain (lt knee). Negative for back pain and neck pain.  Skin:  Positive for rash.  Neurological:  Negative for dizziness and headaches.    Allergies  Allergen Reactions   Penicillins Other (See Comments)    Childhood allergy    OBJECTIVE: Vitals:   02/18/22 1439 02/18/22 2154 02/19/22 0341 02/19/22 0935  BP: 126/84 131/88 123/75 127/73  Pulse: 76 87 77 72  Resp: 19 18 19 16   Temp: 98 F (36.7 C) 99 F (37.2 C) 99 F (37.2 C) 98.7 F (37.1 C)  TempSrc:  Oral  Oral  SpO2: 100% 100% 100% 99%  Weight:      Height:       Body mass index is 23.05 kg/m.  Physical Exam Vitals reviewed.  Constitutional:      Appearance: Normal appearance. He is not ill-appearing.  HENT:     Head:     Comments: Enlarging fluctuant abscess over left orbit on forehead. No altered ROM of EOMs and visual field normal.     Mouth/Throat:     Mouth: Mucous membranes are moist.     Pharynx: Oropharynx is clear.  Neurological:     Mental Status: He is alert.     Lab Results Lab Results  Component Value Date   WBC 13.3 (H) 02/17/2022   HGB 11.7 (L) 02/17/2022   HCT 33.5 (L) 02/17/2022   MCV 95.4 02/17/2022   PLT 277 02/17/2022    Lab Results  Component Value Date   CREATININE 0.77 02/19/2022   BUN 8 02/19/2022   NA 140 02/19/2022   K 3.3 (L) 02/19/2022   CL 102 02/19/2022   CO2 24 02/19/2022    Lab Results  Component Value Date   ALT 18 02/15/2022   AST 19 02/15/2022   ALKPHOS 64 02/15/2022   BILITOT 0.7 02/15/2022     Microbiology: Recent Results (from the past 240 hour(s))  Blood Culture (routine x 2)     Status: Abnormal   Collection Time: 02/15/22  4:58 AM   Specimen: BLOOD LEFT ARM  Result Value Ref Range  Status   Specimen Description BLOOD LEFT ARM  Final   Special Requests   Final    BOTTLES DRAWN AEROBIC AND ANAEROBIC Blood Culture results may not be optimal due to an excessive volume of blood received in culture bottles   Culture  Setup Time   Final    GRAM POSITIVE COCCI IN CLUSTERS IN BOTH AEROBIC AND ANAEROBIC BOTTLES CRITICAL RESULT CALLED TO, READ BACK BY AND VERIFIED WITH: PHARMD FRANK ABBOTT ON 02/15/22 @ 2314 BY DRT Performed at [facility], [address] [place], [place] [address]    Culture METHICILLIN RESISTANT STAPHYLOCOCCUS AUREUS (A)  Final   Report Status 02/17/2022 FINAL  Final   Organism ID, Bacteria METHICILLIN RESISTANT STAPHYLOCOCCUS AUREUS  Final      Susceptibility   Methicillin resistant staphylococcus aureus - MIC*    CIPROFLOXACIN >=8 RESISTANT Resistant     ERYTHROMYCIN >=8 RESISTANT Resistant     GENTAMICIN <=0.5 SENSITIVE Sensitive     OXACILLIN >=4 RESISTANT Resistant     TETRACYCLINE <=1 SENSITIVE Sensitive     VANCOMYCIN 1 SENSITIVE Sensitive     TRIMETH/SULFA >=320 RESISTANT Resistant     CLINDAMYCIN >=8 RESISTANT Resistant     RIFAMPIN <=0.5 SENSITIVE Sensitive     Inducible Clindamycin NEGATIVE Sensitive     * METHICILLIN RESISTANT STAPHYLOCOCCUS AUREUS  Blood Culture (routine x 2)     Status: Abnormal   Collection Time: 02/15/22  4:58 AM   Specimen: BLOOD RIGHT FOREARM  Result Value Ref Range Status   Specimen Description BLOOD RIGHT FOREARM  Final   Special Requests   Final    BOTTLES DRAWN AEROBIC AND ANAEROBIC Blood Culture results may not be optimal due to an excessive volume of blood received in culture bottles   Culture  Setup Time   Final    ANAEROBIC BOTTLE ONLY GRAM POSITIVE COCCI IN CLUSTERS CRITICAL VALUE NOTED.  VALUE IS CONSISTENT WITH PREVIOUSLY REPORTED AND CALLED VALUE.    Culture (A)  Final    STAPHYLOCOCCUS AUREUS SUSCEPTIBILITIES PERFORMED ON PREVIOUS CULTURE WITHIN THE LAST 5 DAYS. Performed at [facility], [address] [place], [place] [address]    Report Status 02/17/2022 FINAL  Final  Blood Culture ID Panel (Reflexed)     Status: Abnormal   Collection Time: 02/15/22  4:58 AM  Result Value Ref Range Status   Enterococcus faecalis NOT DETECTED NOT DETECTED Final   Enterococcus Faecium NOT DETECTED NOT DETECTED Final   Listeria monocytogenes NOT DETECTED NOT DETECTED Final  Staphylococcus species DETECTED (A) NOT DETECTED Final    Comment: CRITICAL RESULT CALLED TO, READ BACK BY AND VERIFIED WITH: PHARMD FRANK ABBOTT ON 02/15/22 @ 2314 BY DRT    Staphylococcus aureus (BCID) DETECTED (A) NOT DETECTED Final    Comment: Methicillin (oxacillin)-resistant Staphylococcus aureus (MRSA). MRSA is predictably resistant to beta-lactam antibiotics (except ceftaroline). Preferred therapy is vancomycin unless clinically contraindicated. Patient requires contact precautions if  hospitalized. CRITICAL RESULT CALLED TO, READ BACK BY AND VERIFIED WITH: PHARMD FRANK ABBOTT ON 02/15/22 @ 2314 BY DRT    Staphylococcus epidermidis NOT DETECTED NOT DETECTED Final   Staphylococcus lugdunensis NOT DETECTED NOT DETECTED Final   Streptococcus species NOT DETECTED NOT DETECTED Final   Streptococcus agalactiae NOT DETECTED NOT DETECTED Final   Streptococcus pneumoniae NOT DETECTED NOT DETECTED Final   Streptococcus pyogenes NOT DETECTED NOT DETECTED Final   A.calcoaceticus-baumannii NOT DETECTED NOT DETECTED Final   Bacteroides fragilis NOT DETECTED NOT DETECTED Final   Enterobacterales NOT DETECTED NOT DETECTED Final   Enterobacter cloacae complex NOT DETECTED NOT DETECTED Final   Escherichia coli NOT DETECTED NOT DETECTED Final   Klebsiella aerogenes NOT DETECTED NOT DETECTED Final   Klebsiella oxytoca NOT DETECTED NOT DETECTED Final   Klebsiella pneumoniae NOT DETECTED NOT DETECTED Final   Proteus species NOT DETECTED NOT DETECTED Final   Salmonella species NOT DETECTED NOT DETECTED Final   Serratia  marcescens NOT DETECTED NOT DETECTED Final   Haemophilus influenzae NOT DETECTED NOT DETECTED Final   Neisseria meningitidis NOT DETECTED NOT DETECTED Final   Pseudomonas aeruginosa NOT DETECTED NOT DETECTED Final   Stenotrophomonas maltophilia NOT DETECTED NOT DETECTED Final   Candida albicans NOT DETECTED NOT DETECTED Final   Candida auris NOT DETECTED NOT DETECTED Final   Candida glabrata NOT DETECTED NOT DETECTED Final   Candida krusei NOT DETECTED NOT DETECTED Final   Candida parapsilosis NOT DETECTED NOT DETECTED Final   Candida tropicalis NOT DETECTED NOT DETECTED Final   Cryptococcus neoformans/gattii NOT DETECTED NOT DETECTED Final   Meth resistant mecA/C and MREJ DETECTED (A) NOT DETECTED Final    Comment: CRITICAL RESULT CALLED TO, READ BACK BY AND VERIFIED WITH: PHARMD FRANK ABBOTT ON 02/15/22 @ 2314 BY DRT Performed at [facility], [address], [place], [place] [address]   Aerobic Culture w Gram Stain (superficial specimen)     Status: None   Collection Time: 02/15/22 10:20 AM   Specimen: KNEE  Result Value Ref Range Status   Specimen Description KNEE  Final   Special Requests NONE  Final   Gram Stain   Final    NO WBC SEEN RARE GRAM POSITIVE COCCI Performed at [facility], [address] [place], [place] [address]    Culture   Final    MODERATE METHICILLIN RESISTANT STAPHYLOCOCCUS AUREUS   Report Status 02/17/2022 FINAL  Final   Organism ID, Bacteria METHICILLIN RESISTANT STAPHYLOCOCCUS AUREUS  Final      Susceptibility   Methicillin resistant staphylococcus aureus - MIC*    CIPROFLOXACIN >=8 RESISTANT Resistant     ERYTHROMYCIN >=8 RESISTANT Resistant     GENTAMICIN <=0.5 SENSITIVE Sensitive     OXACILLIN >=4 RESISTANT Resistant     TETRACYCLINE <=1 SENSITIVE Sensitive     VANCOMYCIN 1 SENSITIVE Sensitive     TRIMETH/SULFA >=320 RESISTANT Resistant     CLINDAMYCIN >=8 RESISTANT Resistant     RIFAMPIN <=0.5 SENSITIVE Sensitive     Inducible  Clindamycin NEGATIVE Sensitive     *  MODERATE METHICILLIN RESISTANT STAPHYLOCOCCUS AUREUS  Culture, blood (Routine X 2) w Reflex to ID Panel     Status: None (Preliminary result)   Collection Time: 02/17/22  6:29 AM   Specimen: BLOOD  Result Value Ref Range Status   Specimen Description BLOOD RIGHT ANTECUBITAL  Final   Special Requests   Final    BOTTLES DRAWN AEROBIC AND ANAEROBIC Blood Culture adequate volume   Culture   Final    NO GROWTH 2 DAYS Performed at [facility], [address] [place], [place] [address]    Report Status PENDING  Incomplete  Culture, blood (Routine X 2) w Reflex to ID Panel     Status: None (Preliminary result)   Collection Time: 02/17/22  6:30 AM   Specimen: BLOOD  Result Value Ref Range Status   Specimen Description BLOOD LEFT ANTECUBITAL  Final   Special Requests   Final    BOTTLES DRAWN AEROBIC AND ANAEROBIC Blood Culture adequate volume   Culture   Final    NO GROWTH 2 DAYS Performed at [facility], [address] [place], [place] [address]    Report Status PENDING  Incomplete  Surgical pcr screen     Status: Abnormal   Collection Time: 02/17/22 11:30 AM   Specimen: Nasal Mucosa; Nasal Swab  Result Value Ref Range Status   MRSA, PCR POSITIVE (A) NEGATIVE Final   Staphylococcus aureus POSITIVE (A) NEGATIVE Final    Comment: RESULT CALLED TO, READ BACK BY AND VERIFIED WITH: 02/17/2022 @ 1605 RN ANGELITO, ADC (NOTE) The Xpert SA Assay (FDA approved for NASAL specimens in patients 46 years of age and older), is one component of a comprehensive surveillance program. It is not intended to diagnose infection nor to guide or monitor treatment. Performed at [facility], [address] [place], [place] [address]   Aerobic/Anaerobic Culture w Gram Stain (surgical/deep wound)     Status: None (Preliminary result)   Collection Time: 02/17/22  6:05 PM   Specimen: Abscess  Result Value Ref Range Status   Specimen Description  ABSCESS LEFT KNEE  Final   Special Requests SPECIMEN A  Final   Gram Stain   Final    MODERATE WBC PRESENT,BOTH PMN AND MONONUCLEAR FEW GRAM POSITIVE COCCI IN CLUSTERS RARE GRAM POSITIVE COCCI IN PAIRS    Culture   Final    TOO YOUNG TO READ Performed at [facility], [address] [place], [place] [address]    Report Status PENDING  Incomplete  Aerobic/Anaerobic Culture w Gram Stain (surgical/deep wound)     Status: None (Preliminary result)   Collection Time: 02/17/22  6:05 PM   Specimen: Abscess  Result Value Ref Range Status   Specimen Description ABSCESS LEFT KNEE  Final   Special Requests SPECIMEN B  Final   Gram Stain   Final    FEW WBC PRESENT,BOTH PMN AND MONONUCLEAR RARE GRAM POSITIVE COCCI IN PAIRS    Culture   Final    TOO YOUNG TO READ Performed at [facility], [address] [place], [place] [address]    Report Status PENDING  Incomplete  Aerobic Culture w Gram Stain (superficial specimen)     Status: None (Preliminary result)   Collection Time: 02/18/22  3:43 PM   Specimen: Head; Abscess  Result Value Ref Range Status   Specimen Description HEAD  Final   Special Requests Normal  Final   Gram Stain   Final  RARE WBC PRESENT,BOTH PMN AND MONONUCLEAR FEW GRAM POSITIVE COCCI IN PAIRS IN SINGLES    Culture   Final    CULTURE REINCUBATED FOR BETTER GROWTH Performed at [facility], [address] [place], [place] [address]    Report Status PENDING  Incomplete     Rexene Alberts, MSN, NP-C [facility]  [email or web address] Pager: [phone] Office: [phone] [facility] Main Line: [phone] *Secure Chat Communication Welcome

## 2022-02-19 NOTE — Progress Notes (Signed)
Mobility Specialist Progress Note   02/19/22 1755  Mobility  Activity Ambulated independently in hallway  Level of Assistance Independent after set-up  Assistive Device None  Distance Ambulated (ft) 550 ft  Activity Response Tolerated well  $Mobility charge 1 Mobility   Received pt in bed stating no pain and agreeable to mobility. Pt ambulating w/ antalgic like gait d/t stiffness in L knee, having no complaints of pain and able to return to room w/o fault. Left back in bed w/ call bell in reach and all needs met.  Holland Falling Mobility Specialist MS [facility] #:  [phone] Acute Rehab Office:  [phone]

## 2022-02-20 LAB — CBC
HCT: 33.1 % — ABNORMAL LOW (ref 39.0–52.0)
Hemoglobin: 11.4 g/dL — ABNORMAL LOW (ref 13.0–17.0)
MCH: 33.4 pg (ref 26.0–34.0)
MCHC: 34.4 g/dL (ref 30.0–36.0)
MCV: 97.1 fL (ref 80.0–100.0)
Platelets: 370 10*3/uL (ref 150–400)
RBC: 3.41 MIL/uL — ABNORMAL LOW (ref 4.22–5.81)
RDW: 12.5 % (ref 11.5–15.5)
WBC: 12.2 10*3/uL — ABNORMAL HIGH (ref 4.0–10.5)
nRBC: 0 % (ref 0.0–0.2)

## 2022-02-20 LAB — BASIC METABOLIC PANEL
Anion gap: 8 (ref 5–15)
BUN: 10 mg/dL (ref 6–20)
CO2: 27 mmol/L (ref 22–32)
Calcium: 8.3 mg/dL — ABNORMAL LOW (ref 8.9–10.3)
Chloride: 104 mmol/L (ref 98–111)
Creatinine, Ser: 0.81 mg/dL (ref 0.61–1.24)
GFR, Estimated: >60 mL/min (ref >60)
Glucose, Bld: 120 mg/dL — ABNORMAL HIGH (ref 70–99)
Potassium: 3.2 mmol/L — ABNORMAL LOW (ref 3.5–5.1)
Sodium: 139 mmol/L (ref 135–145)

## 2022-02-20 LAB — AEROBIC CULTURE W GRAM STAIN (SUPERFICIAL SPECIMEN): Special Requests: NORMAL

## 2022-02-20 MED ORDER — POTASSIUM CHLORIDE CRYS ER 20 MEQ PO TBCR
40.0000 meq | EXTENDED_RELEASE_TABLET | Freq: Once | ORAL | Status: AC
  Administered 2022-02-20: 40 meq via ORAL
  Filled 2022-02-20: qty 2

## 2022-02-20 NOTE — Plan of Care (Signed)
  Problem: Skin Integrity: Goal: Skin integrity will improve Outcome: Progressing   Problem: Education: Goal: Knowledge of General Education information will improve Description: Including pain rating scale, medication(s)/side effects and non-pharmacologic comfort measures Outcome: Progressing   Problem: Activity: Goal: Risk for activity intolerance will decrease Outcome: Progressing   Problem: Pain Managment: Goal: General experience of comfort will improve Outcome: Progressing   Problem: Safety: Goal: Ability to remain free from injury will improve Outcome: Progressing   Problem: Skin Integrity: Goal: Risk for impaired skin integrity will decrease Outcome: Progressing

## 2022-02-20 NOTE — Progress Notes (Signed)
Mobility Specialist Progress Note   02/20/22 1420  Mobility  Activity Ambulated independently in hallway  Level of Assistance Independent after set-up  Assistive Device None  LLE Weight Bearing WBAT  Distance Ambulated (ft) 780 ft  Activity Response Tolerated well  $Mobility charge 1 Mobility   Received pt in bed stating no pain and agreeable to mobility. Pt ambulating w/ antalgic like gait d/t stiffness in L knee, having no complaints of pain and able to return to room w/o fault. Left back in bed w/ call bell in reach and all needs met.  Holland Falling Mobility Specialist MS [facility] #:  [phone] Acute Rehab Office:  [phone]

## 2022-02-20 NOTE — Plan of Care (Signed)

## 2022-02-20 NOTE — Progress Notes (Signed)
Progress Note  Patient: Eric Woods DDU:[id number] DOB: [birth year]  DOA: 02/14/2022  DOS: 02/20/2022    Brief hospital course: Eric Woods is a 46 y.o. male with a history of methamphetamine use, incarceration, skin picking behavior, recurrent MRSA infections who presented with left knee pain and several areas of skin and soft tissue infections. Broad IV antibiotics were started, narrowed to vancomycin and the patient was admitted. Orthopedics consulted, performed bedside left knee I&D 9/8. Blood cultures have grown MRSA. TTE and then subsequent TEE without vegetation. Left knee washout in OR performed 9/10 noting deep abscess without joint involvement. Also underwent bedside I&D of left forehead abscess by plastics on 9/11. Continues on IV antibiotics, vancomycin switched to daptomycin on 9/12. Repeat blood cultures 9/10 are NGTD.  Assessment and Plan: Multifocal SSTI, MRSA bacteremia: No vegetation on TEE. - Continue IV antibiotics for 6 weeks per ID. Switching vancomycin to daptomycin for ease of dosing. Weekly CK ordered. - Note previous hospitalization for MRSA infection and patient is "unsure" whether he took oral doxycycline after discharge.  - Continue oral analgesic only. Adequate control.  - Repeat blood cultures 9/10 sent, NGTD - HIV NR July 2023, RPR NR, HCV and HPV SAg negative/NR. - Trend neutrophilic leukocytosis. Improving.  Left knee cellulitis and abscess: Intraarticular signal more consistent with reactive change, not suspected to be joint involvement per orthopedics.  - Orthopedics, Dr. Everardo Pacific, performed operative I&D 9/10. Cultures MRSA.  - Distal LLE swelling improving, negative venous U/S for DVT.    Left forehead cellulitis and abscess: s/p bedside I&D 9/11 by plastics with 5cc purulent discharge collected. Culture +MRSA.  Left preseptal cellulitis: Significantly improved. Was discussed with ophthalmology, Dr. Allena Katz on 9/9  Polysubstance abuse: UDS +amphetamine,  THC.  - Cessation counseling provided. Consider re-enrollment in treatment program  - Precludes discharge with PICC line in place. - TOC consulted  Tobacco use:  - Cessation counseling provided  Hyponatremia: Mild  Hypokalemia: Mild. - Repeat supplementation today.  Urinary hesitancy: UA negative. Symptoms currently resolved. - Monitor clinically/prn bladder scan.   Subjective: Having nightly sweating episodes about stable. No new symptoms to report. Wondering if continued discharge from left forehead is ok.   Objective: Vitals:   02/19/22 0935 02/19/22 2200 02/20/22 0400 02/20/22 0849  BP: 127/73  134/86 126/81  Pulse: 72  75 75  Resp: 16  17 17   Temp: 98.7 F (37.1 C)  98.6 F (37 C) 97.9 F (36.6 C)  TempSrc: Oral     SpO2: 99% 99% 100% 100%  Weight:      Height:      Gen: 46 y.o. male in no distress Pulm: Nonlabored breathing room air. Clear. CV: Regular rate and rhythm. No murmur, rub, or gallop. No JVD, no dependent edema. GI: Abdomen soft, non-tender, non-distended, with normoactive bowel sounds.  Ext: Warm, no deformities Skin: Forehead abscess still draining some purulence, no fluctuance and greatly decreased erythema. Left knee wrapped. No new rashes, lesions or ulcers on visualized skin. Neuro: Alert and oriented. No focal neurological deficits. Psych: Judgement and insight appear fair. Mood euthymic & affect congruent. Behavior is appropriate.    Data Personally reviewed: CBC: Recent Labs  Lab 02/15/22 0458 02/16/22 0050 02/17/22 0630 02/20/22 0132  WBC 15.9* 16.8* 13.3* 12.2*  NEUTROABS 13.2*  --  9.8*  --   HGB 14.4 11.4* 11.7* 11.4*  HCT 41.8 32.9* 33.5* 33.1*  MCV 97.2 97.3 95.4 97.1  PLT 312 262 277 370   Basic Metabolic Panel: Recent  Labs  Lab 02/15/22 0458 02/16/22 0050 02/19/22 0143 02/20/22 0132  NA 132* 131* 140 139  K 3.4* 3.9 3.3* 3.2*  CL 95* 97* 102 104  CO2 27 22 24 27   GLUCOSE 126* 92 119* 120*  BUN 11 16 8 10    CREATININE 1.11 0.83 0.77 0.81  CALCIUM 9.4 8.5* 8.4* 8.3*   GFR: Estimated Creatinine Clearance: 125.6 mL/min (by C-G formula based on SCr of 0.81 mg/dL). Liver Function Tests: Recent Labs  Lab 02/15/22 0458  AST 19  ALT 18  ALKPHOS 64  BILITOT 0.7  PROT 7.3  ALBUMIN 3.7    Recent Results (from the past 240 hour(s))  Blood Culture (routine x 2)     Status: Abnormal   Collection Time: 02/15/22  4:58 AM   Specimen: BLOOD LEFT ARM  Result Value Ref Range Status   Specimen Description BLOOD LEFT ARM  Final   Special Requests   Final    BOTTLES DRAWN AEROBIC AND ANAEROBIC Blood Culture results may not be optimal due to an excessive volume of blood received in culture bottles   Culture  Setup Time   Final    GRAM POSITIVE COCCI IN CLUSTERS IN BOTH AEROBIC AND ANAEROBIC BOTTLES CRITICAL RESULT CALLED TO, READ BACK BY AND VERIFIED WITH: PHARMD FRANK ABBOTT ON 02/15/22 @ 2314 BY DRT Performed at [facility], [address] [place], [place] [address]    Culture METHICILLIN RESISTANT STAPHYLOCOCCUS AUREUS (A)  Final   Report Status 02/17/2022 FINAL  Final   Organism ID, Bacteria METHICILLIN RESISTANT STAPHYLOCOCCUS AUREUS  Final      Susceptibility   Methicillin resistant staphylococcus aureus - MIC*    CIPROFLOXACIN >=8 RESISTANT Resistant     ERYTHROMYCIN >=8 RESISTANT Resistant     GENTAMICIN <=0.5 SENSITIVE Sensitive     OXACILLIN >=4 RESISTANT Resistant     TETRACYCLINE <=1 SENSITIVE Sensitive     VANCOMYCIN 1 SENSITIVE Sensitive     TRIMETH/SULFA >=320 RESISTANT Resistant     CLINDAMYCIN >=8 RESISTANT Resistant     RIFAMPIN <=0.5 SENSITIVE Sensitive     Inducible Clindamycin NEGATIVE Sensitive     * METHICILLIN RESISTANT STAPHYLOCOCCUS AUREUS  Blood Culture (routine x 2)     Status: Abnormal   Collection Time: 02/15/22  4:58 AM   Specimen: BLOOD RIGHT FOREARM  Result Value Ref Range Status   Specimen Description BLOOD RIGHT FOREARM  Final   Special Requests    Final    BOTTLES DRAWN AEROBIC AND ANAEROBIC Blood Culture results may not be optimal due to an excessive volume of blood received in culture bottles   Culture  Setup Time   Final    ANAEROBIC BOTTLE ONLY GRAM POSITIVE COCCI IN CLUSTERS CRITICAL VALUE NOTED.  VALUE IS CONSISTENT WITH PREVIOUSLY REPORTED AND CALLED VALUE.    Culture (A)  Final    STAPHYLOCOCCUS AUREUS SUSCEPTIBILITIES PERFORMED ON PREVIOUS CULTURE WITHIN THE LAST 5 DAYS. Performed at [facility], [address] [place], [place] [address]    Report Status 02/17/2022 FINAL  Final  Blood Culture ID Panel (Reflexed)     Status: Abnormal   Collection Time: 02/15/22  4:58 AM  Result Value Ref Range Status   Enterococcus faecalis NOT DETECTED NOT DETECTED Final   Enterococcus Faecium NOT DETECTED NOT DETECTED Final   Listeria monocytogenes NOT DETECTED NOT DETECTED Final   Staphylococcus species DETECTED (A) NOT DETECTED Final    Comment: CRITICAL RESULT CALLED TO, READ BACK BY  AND VERIFIED WITH: PHARMD FRANK ABBOTT ON 02/15/22 @ 2314 BY DRT    Staphylococcus aureus (BCID) DETECTED (A) NOT DETECTED Final    Comment: Methicillin (oxacillin)-resistant Staphylococcus aureus (MRSA). MRSA is predictably resistant to beta-lactam antibiotics (except ceftaroline). Preferred therapy is vancomycin unless clinically contraindicated. Patient requires contact precautions if  hospitalized. CRITICAL RESULT CALLED TO, READ BACK BY AND VERIFIED WITH: PHARMD FRANK ABBOTT ON 02/15/22 @ 2314 BY DRT    Staphylococcus epidermidis NOT DETECTED NOT DETECTED Final   Staphylococcus lugdunensis NOT DETECTED NOT DETECTED Final   Streptococcus species NOT DETECTED NOT DETECTED Final   Streptococcus agalactiae NOT DETECTED NOT DETECTED Final   Streptococcus pneumoniae NOT DETECTED NOT DETECTED Final   Streptococcus pyogenes NOT DETECTED NOT DETECTED Final   A.calcoaceticus-baumannii NOT DETECTED NOT DETECTED Final   Bacteroides fragilis NOT  DETECTED NOT DETECTED Final   Enterobacterales NOT DETECTED NOT DETECTED Final   Enterobacter cloacae complex NOT DETECTED NOT DETECTED Final   Escherichia coli NOT DETECTED NOT DETECTED Final   Klebsiella aerogenes NOT DETECTED NOT DETECTED Final   Klebsiella oxytoca NOT DETECTED NOT DETECTED Final   Klebsiella pneumoniae NOT DETECTED NOT DETECTED Final   Proteus species NOT DETECTED NOT DETECTED Final   Salmonella species NOT DETECTED NOT DETECTED Final   Serratia marcescens NOT DETECTED NOT DETECTED Final   Haemophilus influenzae NOT DETECTED NOT DETECTED Final   Neisseria meningitidis NOT DETECTED NOT DETECTED Final   Pseudomonas aeruginosa NOT DETECTED NOT DETECTED Final   Stenotrophomonas maltophilia NOT DETECTED NOT DETECTED Final   Candida albicans NOT DETECTED NOT DETECTED Final   Candida auris NOT DETECTED NOT DETECTED Final   Candida glabrata NOT DETECTED NOT DETECTED Final   Candida krusei NOT DETECTED NOT DETECTED Final   Candida parapsilosis NOT DETECTED NOT DETECTED Final   Candida tropicalis NOT DETECTED NOT DETECTED Final   Cryptococcus neoformans/gattii NOT DETECTED NOT DETECTED Final   Meth resistant mecA/C and MREJ DETECTED (A) NOT DETECTED Final    Comment: CRITICAL RESULT CALLED TO, READ BACK BY AND VERIFIED WITH: PHARMD FRANK ABBOTT ON 02/15/22 @ 2314 BY DRT Performed at [facility], [address] [place], [place] [address]   Aerobic Culture w Gram Stain (superficial specimen)     Status: None   Collection Time: 02/15/22 10:20 AM   Specimen: KNEE  Result Value Ref Range Status   Specimen Description KNEE  Final   Special Requests NONE  Final   Gram Stain   Final    NO WBC SEEN RARE GRAM POSITIVE COCCI Performed at [facility], [address] [place], [place] [address]    Culture   Final    MODERATE METHICILLIN RESISTANT STAPHYLOCOCCUS AUREUS   Report Status 02/17/2022 FINAL  Final   Organism ID, Bacteria METHICILLIN RESISTANT  STAPHYLOCOCCUS AUREUS  Final      Susceptibility   Methicillin resistant staphylococcus aureus - MIC*    CIPROFLOXACIN >=8 RESISTANT Resistant     ERYTHROMYCIN >=8 RESISTANT Resistant     GENTAMICIN <=0.5 SENSITIVE Sensitive     OXACILLIN >=4 RESISTANT Resistant     TETRACYCLINE <=1 SENSITIVE Sensitive     VANCOMYCIN 1 SENSITIVE Sensitive     TRIMETH/SULFA >=320 RESISTANT Resistant     CLINDAMYCIN >=8 RESISTANT Resistant     RIFAMPIN <=0.5 SENSITIVE Sensitive     Inducible Clindamycin NEGATIVE Sensitive     * MODERATE METHICILLIN RESISTANT STAPHYLOCOCCUS AUREUS  Culture, blood (Routine X 2) w Reflex to ID Panel  Status: None (Preliminary result)   Collection Time: 02/17/22  6:29 AM   Specimen: BLOOD  Result Value Ref Range Status   Specimen Description BLOOD RIGHT ANTECUBITAL  Final   Special Requests   Final    BOTTLES DRAWN AEROBIC AND ANAEROBIC Blood Culture adequate volume   Culture   Final    NO GROWTH 3 DAYS Performed at [facility], [address], [place], [place] [address]    Report Status PENDING  Incomplete  Culture, blood (Routine X 2) w Reflex to ID Panel     Status: None (Preliminary result)   Collection Time: 02/17/22  6:30 AM   Specimen: BLOOD  Result Value Ref Range Status   Specimen Description BLOOD LEFT ANTECUBITAL  Final   Special Requests   Final    BOTTLES DRAWN AEROBIC AND ANAEROBIC Blood Culture adequate volume   Culture   Final    NO GROWTH 3 DAYS Performed at [facility], [address] [place], [place] [address]    Report Status PENDING  Incomplete  Surgical pcr screen     Status: Abnormal   Collection Time: 02/17/22 11:30 AM   Specimen: Nasal Mucosa; Nasal Swab  Result Value Ref Range Status   MRSA, PCR POSITIVE (A) NEGATIVE Final   Staphylococcus aureus POSITIVE (A) NEGATIVE Final    Comment: RESULT CALLED TO, READ BACK BY AND VERIFIED WITH: 02/17/2022 @ 1605 RN ANGELITO, ADC (NOTE) The Xpert SA Assay (FDA approved for  NASAL specimens in patients 53 years of age and older), is one component of a comprehensive surveillance program. It is not intended to diagnose infection nor to guide or monitor treatment. Performed at [facility], [address] [place], [place] [address]   Fungus Culture With Stain     Status: None (Preliminary result)   Collection Time: 02/17/22  6:05 PM   Specimen: Abscess  Result Value Ref Range Status   Fungus Stain Final report  Final    Comment: (NOTE) Performed At: [facility] [address] [place], [place] [id number] Jolene Schimke MD PY:[id number]    Fungus (Mycology) Culture PENDING  Incomplete   Fungal Source ABSCESS  Final    Comment: LEFT KNEE Performed at [facility], [address], [place], [place] [address]   Fungus Culture With Stain     Status: None (Preliminary result)   Collection Time: 02/17/22  6:05 PM   Specimen: Abscess  Result Value Ref Range Status   Fungus Stain Final report  Final    Comment: (NOTE) Performed At: [facility] [address] [place], [place] [id number] Jolene Schimke MD WI:[id number]    Fungus (Mycology) Culture PENDING  Incomplete   Fungal Source ABSCESS  Final    Comment: LEFT KNEE Performed at [facility], [address] [place], [place] [address]   Aerobic/Anaerobic Culture w Gram Stain (surgical/deep wound)     Status: None (Preliminary result)   Collection Time: 02/17/22  6:05 PM   Specimen: Abscess  Result Value Ref Range Status   Specimen Description ABSCESS LEFT KNEE  Final   Special Requests SPECIMEN A  Final   Gram Stain   Final    MODERATE WBC PRESENT,BOTH PMN AND MONONUCLEAR FEW GRAM POSITIVE COCCI IN CLUSTERS RARE GRAM POSITIVE COCCI IN PAIRS Performed at [facility], [address] [place], [place] [address]    Culture   Final    ABUNDANT METHICILLIN RESISTANT STAPHYLOCOCCUS AUREUS NO  ANAEROBES ISOLATED; CULTURE IN PROGRESS FOR 5 DAYS    Report Status  PENDING  Incomplete   Organism ID, Bacteria METHICILLIN RESISTANT STAPHYLOCOCCUS AUREUS  Final      Susceptibility   Methicillin resistant staphylococcus aureus - MIC*    CIPROFLOXACIN >=8 RESISTANT Resistant     ERYTHROMYCIN >=8 RESISTANT Resistant     GENTAMICIN <=0.5 SENSITIVE Sensitive     OXACILLIN >=4 RESISTANT Resistant     TETRACYCLINE <=1 SENSITIVE Sensitive     VANCOMYCIN <=0.5 SENSITIVE Sensitive     TRIMETH/SULFA >=320 RESISTANT Resistant     CLINDAMYCIN >=8 RESISTANT Resistant     RIFAMPIN <=0.5 SENSITIVE Sensitive     Inducible Clindamycin NEGATIVE Sensitive     * ABUNDANT METHICILLIN RESISTANT STAPHYLOCOCCUS AUREUS  Aerobic/Anaerobic Culture w Gram Stain (surgical/deep wound)     Status: None (Preliminary result)   Collection Time: 02/17/22  6:05 PM   Specimen: Abscess  Result Value Ref Range Status   Specimen Description ABSCESS LEFT KNEE  Final   Special Requests SPECIMEN B  Final   Gram Stain   Final    FEW WBC PRESENT,BOTH PMN AND MONONUCLEAR RARE GRAM POSITIVE COCCI IN PAIRS Performed at [facility], [address], [place], [place] [address]    Culture   Final    MODERATE METHICILLIN RESISTANT STAPHYLOCOCCUS AUREUS NO ANAEROBES ISOLATED; CULTURE IN PROGRESS FOR 5 DAYS    Report Status PENDING  Incomplete   Organism ID, Bacteria METHICILLIN RESISTANT STAPHYLOCOCCUS AUREUS  Final      Susceptibility   Methicillin resistant staphylococcus aureus - MIC*    CIPROFLOXACIN >=8 RESISTANT Resistant     ERYTHROMYCIN >=8 RESISTANT Resistant     GENTAMICIN <=0.5 SENSITIVE Sensitive     OXACILLIN >=4 RESISTANT Resistant     TETRACYCLINE <=1 SENSITIVE Sensitive     VANCOMYCIN <=0.5 SENSITIVE Sensitive     TRIMETH/SULFA >=320 RESISTANT Resistant     CLINDAMYCIN >=8 RESISTANT Resistant     RIFAMPIN <=0.5 SENSITIVE Sensitive     Inducible Clindamycin NEGATIVE Sensitive     * MODERATE METHICILLIN RESISTANT STAPHYLOCOCCUS AUREUS  Fungus Culture Result     Status:  None   Collection Time: 02/17/22  6:05 PM  Result Value Ref Range Status   Result 1 Comment  Final    Comment: (NOTE) KOH/Calcofluor preparation:  no fungus observed. Performed At: [facility] [address] [place], [place] [id number] Jolene Schimke MD NA:[id number]   Fungus Culture Result     Status: None   Collection Time: 02/17/22  6:05 PM  Result Value Ref Range Status   Result 1 Comment  Final    Comment: (NOTE) KOH/Calcofluor preparation:  no fungus observed. Performed At: [facility] [address] [place], [place] [id number] Jolene Schimke MD EG:[id number]   Aerobic Culture w Gram Stain (superficial specimen)     Status: None   Collection Time: 02/18/22  3:43 PM   Specimen: Head; Abscess  Result Value Ref Range Status   Specimen Description HEAD  Final   Special Requests Normal  Final   Gram Stain   Final    RARE WBC PRESENT,BOTH PMN AND MONONUCLEAR FEW GRAM POSITIVE COCCI IN PAIRS IN SINGLES Performed at [facility], [address], [place], [place] [address]    Culture   Final    MODERATE METHICILLIN RESISTANT STAPHYLOCOCCUS AUREUS   Report Status 02/20/2022 FINAL  Final   Organism ID, Bacteria METHICILLIN RESISTANT STAPHYLOCOCCUS AUREUS  Final  Susceptibility   Methicillin resistant staphylococcus aureus - MIC*    CIPROFLOXACIN >=8 RESISTANT Resistant     ERYTHROMYCIN >=8 RESISTANT Resistant     GENTAMICIN <=0.5 SENSITIVE Sensitive     OXACILLIN >=4 RESISTANT Resistant     TETRACYCLINE <=1 SENSITIVE Sensitive     VANCOMYCIN 1 SENSITIVE Sensitive     TRIMETH/SULFA >=320 RESISTANT Resistant     CLINDAMYCIN >=8 RESISTANT Resistant     RIFAMPIN <=0.5 SENSITIVE Sensitive     Inducible Clindamycin NEGATIVE Sensitive     * MODERATE METHICILLIN RESISTANT STAPHYLOCOCCUS AUREUS     Family Communication: Mother at bedside  Disposition: Status is: Inpatient Remains inpatient appropriate because: Continued IV antibiotics for MRSA  bacteremia. ID recommends 6 weeks inpatient IV antibiotics from time of left knee operative I&D on 9/10.  Planned Discharge Destination: Home  Tyrone Nine, MD 02/20/2022 5:26 PM Page by [email or web address]

## 2022-02-21 MED ORDER — MUPIROCIN 2 % EX OINT
TOPICAL_OINTMENT | Freq: Two times a day (BID) | CUTANEOUS | Status: DC
  Administered 2022-02-27: 1 via TOPICAL
  Filled 2022-02-21 (×8): qty 22

## 2022-02-21 MED ORDER — TRIAMCINOLONE ACETONIDE 0.1 % EX CREA
TOPICAL_CREAM | Freq: Three times a day (TID) | CUTANEOUS | Status: DC
  Filled 2022-02-21 (×2): qty 15

## 2022-02-21 NOTE — Progress Notes (Signed)
3 Days Post-Op  Subjective:  Patient resting comfortably in exam bed in no acute distress He notes the dressing has been removed, he denies any pain  Objective: Vital signs in last 24 hours: Temp:  [97.8 F (36.6 C)-98.3 F (36.8 C)] 97.8 F (36.6 C) (09/14 1353) Pulse Rate:  [71-84] 71 (09/14 1353) Resp:  [16-18] 16 (09/14 1353) BP: (120-141)/(83-86) 120/86 (09/14 1353) SpO2:  [99 %-100 %] 100 % (09/14 1353) Last BM Date : 02/20/22   Physical Exam:  General: Pt resting comfortably in no acute distress Left forehead wound flap with no purulent drainage, some minimal surrounding erythema, overlying scabbing   Assessment/Plan: s/p Procedure(s): TRANSESOPHAGEAL ECHOCARDIOGRAM (TEE)   This is a 46 year old male status post I&D of forehead abscess.  It does appear that this was successful, he has no active drainage on my exam today, no reaccumulation of purulence.  At this time the patient may shower, no further management from our perspective.  We will remain available for any questions.   Kelle Darting Sherran Margolis, PA-C 02/21/2022

## 2022-02-21 NOTE — Progress Notes (Cosign Needed Addendum)
[facility]  Date of Admission:  02/14/2022      Total days of antibiotics 7  Vancomycin 7           ASSESSMENT: Eric Woods is a 46 y.o. male admitted with MRSA bacteremia complicated by left knee septic arthritis (now s/p I&D 9/10 with Dr. Everardo PacificVarkey), forehead abscess (s/p I&D 9/11) and skin infections. All surgical cultures growing MRSA.   Repeat blood cultures preliminarily negative - TEE done 9/11 was negative for any signs of endocarditis. Continue course of IV antibiotics with septic knee and abscesses. Could consider reassessment for PO transtion after 2 weeks if he has improved and no other evolved sites.   Evolving papulopustular rash involving left upper eyelid, inner canthus and onto nasal bridge. Painful and swollen today. Does not appear herpetic. No EOM or visual field complaints and sclera/conjunctiva clear.  Suspect inoculation from staph from multiple forehead abscesses that are in close proximity -- bullous impetigo? Will add topical mupirocin ointment BID for this - careful to avoid any contact with eye itself.  Careful handwashing of the patient, nails trimmed, avoid touching face as much as possible. ?opthal eval if this continues to evolve.   Methamphetamine use, intranasal. HIV non reactive, HCV Ab non-reactive. HBsAg pending. RPR non-reactive. He ascribes only to intranasal/inhalation use but I would not feel terribly comfortable with PICC line outpatient for him.    PLAN: Continue vancomycin - recommend re-eval in 2 weeks on 9/25  Topical mupirocin ointment BID to left upper eyelid - avoid contact with eyeball  Follow creatinine 2x weekly minimum Vanc levels per pharmacy     Principal Problem:   MRSA bacteremia Active Problems:   Cellulitis   Infection of skin due to methicillin resistant Staphylococcus aureus (MRSA)   Abscess of forehead   Septic arthritis of knee, left (HCC)   Polysubstance abuse (HCC)   Tobacco  dependence    celecoxib  100 mg Oral BID   chlorhexidine  60 mL Topical Once   Chlorhexidine Gluconate Cloth  6 each Topical Q0600   docusate sodium  100 mg Oral BID   enoxaparin (LOVENOX) injection  40 mg Subcutaneous Q24H   lidocaine-EPINEPHrine  20 mL Infiltration Once   mupirocin ointment   Topical BID   povidone-iodine  2 Application Topical Once   sodium chloride flush  3 mL Intravenous Q12H   triamcinolone cream   Topical TID    SUBJECTIVE: Had I&D of the forehead abscess yesterday and feeling like it is better. Sore but smaller.     Review of Systems: Review of Systems  Constitutional:  Negative for chills, fever and malaise/fatigue.  Eyes:  Negative for blurred vision, pain and discharge.  Cardiovascular:  Positive for leg swelling.  Musculoskeletal:  Positive for joint pain (lt knee). Negative for back pain and neck pain.  Skin:  Positive for rash.  Neurological:  Negative for dizziness and headaches.    No Known Allergies   OBJECTIVE: Vitals:   02/20/22 0849 02/20/22 2116 02/21/22 0855 02/21/22 0922  BP: 126/81 (!) 141/84 127/83 134/86  Pulse: 75 84  77  Resp: 17 18 18 17   Temp: 97.9 F (36.6 C) 98.3 F (36.8 C) 98.1 F (36.7 C) 98.2 F (36.8 C)  TempSrc:  Oral Oral Oral  SpO2: 100% 100% 100% 99%  Weight:      Height:       Body mass index is 23.05 kg/m.  Physical Exam Vitals reviewed.  Constitutional:      Appearance: Normal appearance. He is not ill-appearing.  HENT:     Head:     Comments: Forehead abscess improved    Mouth/Throat:     Mouth: Mucous membranes are moist.     Pharynx: Oropharynx is clear.  Neurological:     Mental Status: He is alert.      Lab Results Lab Results  Component Value Date   WBC 12.2 (H) 02/20/2022   HGB 11.4 (L) 02/20/2022   HCT 33.1 (L) 02/20/2022   MCV 97.1 02/20/2022   PLT 370 02/20/2022    Lab Results  Component Value Date   CREATININE 0.81 02/20/2022   BUN 10 02/20/2022   NA 139 02/20/2022    K 3.2 (L) 02/20/2022   CL 104 02/20/2022   CO2 27 02/20/2022    Lab Results  Component Value Date   ALT 18 02/15/2022   AST 19 02/15/2022   ALKPHOS 64 02/15/2022   BILITOT 0.7 02/15/2022     Microbiology: Recent Results (from the past 240 hour(s))  Blood Culture (routine x 2)     Status: Abnormal   Collection Time: 02/15/22  4:58 AM   Specimen: BLOOD LEFT ARM  Result Value Ref Range Status   Specimen Description BLOOD LEFT ARM  Final   Special Requests   Final    BOTTLES DRAWN AEROBIC AND ANAEROBIC Blood Culture results may not be optimal due to an excessive volume of blood received in culture bottles   Culture  Setup Time   Final    GRAM POSITIVE COCCI IN CLUSTERS IN BOTH AEROBIC AND ANAEROBIC BOTTLES CRITICAL RESULT CALLED TO, READ BACK BY AND VERIFIED WITH: PHARMD FRANK ABBOTT ON 02/15/22 @ 2314 BY DRT Performed at [facility] Lab, [address] [place], [place] [address]    Culture METHICILLIN RESISTANT STAPHYLOCOCCUS AUREUS (A)  Final   Report Status 02/17/2022 FINAL  Final   Organism ID, Bacteria METHICILLIN RESISTANT STAPHYLOCOCCUS AUREUS  Final      Susceptibility   Methicillin resistant staphylococcus aureus - MIC*    CIPROFLOXACIN >=8 RESISTANT Resistant     ERYTHROMYCIN >=8 RESISTANT Resistant     GENTAMICIN <=0.5 SENSITIVE Sensitive     OXACILLIN >=4 RESISTANT Resistant     TETRACYCLINE <=1 SENSITIVE Sensitive     VANCOMYCIN 1 SENSITIVE Sensitive     TRIMETH/SULFA >=320 RESISTANT Resistant     CLINDAMYCIN >=8 RESISTANT Resistant     RIFAMPIN <=0.5 SENSITIVE Sensitive     Inducible Clindamycin NEGATIVE Sensitive     * METHICILLIN RESISTANT STAPHYLOCOCCUS AUREUS  Blood Culture (routine x 2)     Status: Abnormal   Collection Time: 02/15/22  4:58 AM   Specimen: BLOOD RIGHT FOREARM  Result Value Ref Range Status   Specimen Description BLOOD RIGHT FOREARM  Final   Special Requests   Final    BOTTLES DRAWN AEROBIC AND ANAEROBIC Blood Culture results may not  be optimal due to an excessive volume of blood received in culture bottles   Culture  Setup Time   Final    ANAEROBIC BOTTLE ONLY GRAM POSITIVE COCCI IN CLUSTERS CRITICAL VALUE NOTED.  VALUE IS CONSISTENT WITH PREVIOUSLY REPORTED AND CALLED VALUE.    Culture (A)  Final    STAPHYLOCOCCUS AUREUS SUSCEPTIBILITIES PERFORMED ON PREVIOUS CULTURE WITHIN THE LAST 5 DAYS. Performed at [facility], [address], [place], [place] [address]    Report Status 02/17/2022 FINAL  Final  Blood Culture ID  Panel (Reflexed)     Status: Abnormal   Collection Time: 02/15/22  4:58 AM  Result Value Ref Range Status   Enterococcus faecalis NOT DETECTED NOT DETECTED Final   Enterococcus Faecium NOT DETECTED NOT DETECTED Final   Listeria monocytogenes NOT DETECTED NOT DETECTED Final   Staphylococcus species DETECTED (A) NOT DETECTED Final    Comment: CRITICAL RESULT CALLED TO, READ BACK BY AND VERIFIED WITH: PHARMD FRANK ABBOTT ON 02/15/22 @ 2314 BY DRT    Staphylococcus aureus (BCID) DETECTED (A) NOT DETECTED Final    Comment: Methicillin (oxacillin)-resistant Staphylococcus aureus (MRSA). MRSA is predictably resistant to beta-lactam antibiotics (except ceftaroline). Preferred therapy is vancomycin unless clinically contraindicated. Patient requires contact precautions if  hospitalized. CRITICAL RESULT CALLED TO, READ BACK BY AND VERIFIED WITH: PHARMD FRANK ABBOTT ON 02/15/22 @ 2314 BY DRT    Staphylococcus epidermidis NOT DETECTED NOT DETECTED Final   Staphylococcus lugdunensis NOT DETECTED NOT DETECTED Final   Streptococcus species NOT DETECTED NOT DETECTED Final   Streptococcus agalactiae NOT DETECTED NOT DETECTED Final   Streptococcus pneumoniae NOT DETECTED NOT DETECTED Final   Streptococcus pyogenes NOT DETECTED NOT DETECTED Final   A.calcoaceticus-baumannii NOT DETECTED NOT DETECTED Final   Bacteroides fragilis NOT DETECTED NOT DETECTED Final   Enterobacterales NOT DETECTED NOT DETECTED Final    Enterobacter cloacae complex NOT DETECTED NOT DETECTED Final   Escherichia coli NOT DETECTED NOT DETECTED Final   Klebsiella aerogenes NOT DETECTED NOT DETECTED Final   Klebsiella oxytoca NOT DETECTED NOT DETECTED Final   Klebsiella pneumoniae NOT DETECTED NOT DETECTED Final   Proteus species NOT DETECTED NOT DETECTED Final   Salmonella species NOT DETECTED NOT DETECTED Final   Serratia marcescens NOT DETECTED NOT DETECTED Final   Haemophilus influenzae NOT DETECTED NOT DETECTED Final   Neisseria meningitidis NOT DETECTED NOT DETECTED Final   Pseudomonas aeruginosa NOT DETECTED NOT DETECTED Final   Stenotrophomonas maltophilia NOT DETECTED NOT DETECTED Final   Candida albicans NOT DETECTED NOT DETECTED Final   Candida auris NOT DETECTED NOT DETECTED Final   Candida glabrata NOT DETECTED NOT DETECTED Final   Candida krusei NOT DETECTED NOT DETECTED Final   Candida parapsilosis NOT DETECTED NOT DETECTED Final   Candida tropicalis NOT DETECTED NOT DETECTED Final   Cryptococcus neoformans/gattii NOT DETECTED NOT DETECTED Final   Meth resistant mecA/C and MREJ DETECTED (A) NOT DETECTED Final    Comment: CRITICAL RESULT CALLED TO, READ BACK BY AND VERIFIED WITH: PHARMD FRANK ABBOTT ON 02/15/22 @ 2314 BY DRT Performed at [facility] Lab, [address] [place], [place] [address]   Aerobic Culture w Gram Stain (superficial specimen)     Status: None   Collection Time: 02/15/22 10:20 AM   Specimen: KNEE  Result Value Ref Range Status   Specimen Description KNEE  Final   Special Requests NONE  Final   Gram Stain   Final    NO WBC SEEN RARE GRAM POSITIVE COCCI Performed at [facility] Lab, [address], [place], [place] [address]    Culture   Final    MODERATE METHICILLIN RESISTANT STAPHYLOCOCCUS AUREUS   Report Status 02/17/2022 FINAL  Final   Organism ID, Bacteria METHICILLIN RESISTANT STAPHYLOCOCCUS AUREUS  Final      Susceptibility   Methicillin resistant staphylococcus aureus  - MIC*    CIPROFLOXACIN >=8 RESISTANT Resistant     ERYTHROMYCIN >=8 RESISTANT Resistant     GENTAMICIN <=0.5 SENSITIVE Sensitive     OXACILLIN >=4 RESISTANT Resistant  TETRACYCLINE <=1 SENSITIVE Sensitive     VANCOMYCIN 1 SENSITIVE Sensitive     TRIMETH/SULFA >=320 RESISTANT Resistant     CLINDAMYCIN >=8 RESISTANT Resistant     RIFAMPIN <=0.5 SENSITIVE Sensitive     Inducible Clindamycin NEGATIVE Sensitive     * MODERATE METHICILLIN RESISTANT STAPHYLOCOCCUS AUREUS  Culture, blood (Routine X 2) w Reflex to ID Panel     Status: None (Preliminary result)   Collection Time: 02/17/22  6:29 AM   Specimen: BLOOD  Result Value Ref Range Status   Specimen Description BLOOD RIGHT ANTECUBITAL  Final   Special Requests   Final    BOTTLES DRAWN AEROBIC AND ANAEROBIC Blood Culture adequate volume   Culture   Final    NO GROWTH 4 DAYS Performed at [facility], [address], [place], [place] [address]    Report Status PENDING  Incomplete  Culture, blood (Routine X 2) w Reflex to ID Panel     Status: None (Preliminary result)   Collection Time: 02/17/22  6:30 AM   Specimen: BLOOD  Result Value Ref Range Status   Specimen Description BLOOD LEFT ANTECUBITAL  Final   Special Requests   Final    BOTTLES DRAWN AEROBIC AND ANAEROBIC Blood Culture adequate volume   Culture   Final    NO GROWTH 4 DAYS Performed at [facility] Lab, [address] [place], [place] [address]    Report Status PENDING  Incomplete  Surgical pcr screen     Status: Abnormal   Collection Time: 02/17/22 11:30 AM   Specimen: Nasal Mucosa; Nasal Swab  Result Value Ref Range Status   MRSA, PCR POSITIVE (A) NEGATIVE Final   Staphylococcus aureus POSITIVE (A) NEGATIVE Final    Comment: RESULT CALLED TO, READ BACK BY AND VERIFIED WITH: 02/17/2022 @ 1605 RN ANGELITO, ADC (NOTE) The Xpert SA Assay (FDA approved for NASAL specimens in patients 24 years of age and older), is one component of a  comprehensive surveillance program. It is not intended to diagnose infection nor to guide or monitor treatment. Performed at [facility] Lab, [address], [place], [place] [address]   Fungus Culture With Stain     Status: None (Preliminary result)   Collection Time: 02/17/22  6:05 PM   Specimen: Abscess  Result Value Ref Range Status   Fungus Stain Final report  Final    Comment: (NOTE) Performed At: [facility] [address] [place], [place] [id number] Jolene Schimke MD OZ:[id number]    Fungus (Mycology) Culture PENDING  Incomplete   Fungal Source ABSCESS  Final    Comment: LEFT KNEE Performed at [facility] Lab, [address], [place], [place] [address]   Fungus Culture With Stain     Status: None (Preliminary result)   Collection Time: 02/17/22  6:05 PM   Specimen: Abscess  Result Value Ref Range Status   Fungus Stain Final report  Final    Comment: (NOTE) Performed At: [facility] [address] [place], [place] [id number] Jolene Schimke MD UU:[id number]    Fungus (Mycology) Culture PENDING  Incomplete   Fungal Source ABSCESS  Final    Comment: LEFT KNEE Performed at [facility], [address] [place], [place] [address]   Aerobic/Anaerobic Culture w Gram Stain (surgical/deep wound)     Status: None (Preliminary result)   Collection Time: 02/17/22  6:05 PM   Specimen: Abscess  Result Value Ref Range Status   Specimen Description ABSCESS  LEFT KNEE  Final   Special Requests SPECIMEN A  Final   Gram Stain   Final    MODERATE WBC PRESENT,BOTH PMN AND MONONUCLEAR FEW GRAM POSITIVE COCCI IN CLUSTERS RARE GRAM POSITIVE COCCI IN PAIRS Performed at [facility] Lab, [address], [place], [place] [address]    Culture   Final    ABUNDANT METHICILLIN RESISTANT STAPHYLOCOCCUS AUREUS NO ANAEROBES ISOLATED; CULTURE IN PROGRESS FOR 5 DAYS    Report Status PENDING  Incomplete   Organism ID, Bacteria METHICILLIN RESISTANT  STAPHYLOCOCCUS AUREUS  Final      Susceptibility   Methicillin resistant staphylococcus aureus - MIC*    CIPROFLOXACIN >=8 RESISTANT Resistant     ERYTHROMYCIN >=8 RESISTANT Resistant     GENTAMICIN <=0.5 SENSITIVE Sensitive     OXACILLIN >=4 RESISTANT Resistant     TETRACYCLINE <=1 SENSITIVE Sensitive     VANCOMYCIN <=0.5 SENSITIVE Sensitive     TRIMETH/SULFA >=320 RESISTANT Resistant     CLINDAMYCIN >=8 RESISTANT Resistant     RIFAMPIN <=0.5 SENSITIVE Sensitive     Inducible Clindamycin NEGATIVE Sensitive     * ABUNDANT METHICILLIN RESISTANT STAPHYLOCOCCUS AUREUS  Aerobic/Anaerobic Culture w Gram Stain (surgical/deep wound)     Status: None (Preliminary result)   Collection Time: 02/17/22  6:05 PM   Specimen: Abscess  Result Value Ref Range Status   Specimen Description ABSCESS LEFT KNEE  Final   Special Requests SPECIMEN B  Final   Gram Stain   Final    FEW WBC PRESENT,BOTH PMN AND MONONUCLEAR RARE GRAM POSITIVE COCCI IN PAIRS Performed at [facility] Lab, [address], [place], [place] [address]    Culture   Final    MODERATE METHICILLIN RESISTANT STAPHYLOCOCCUS AUREUS NO ANAEROBES ISOLATED; CULTURE IN PROGRESS FOR 5 DAYS    Report Status PENDING  Incomplete   Organism ID, Bacteria METHICILLIN RESISTANT STAPHYLOCOCCUS AUREUS  Final      Susceptibility   Methicillin resistant staphylococcus aureus - MIC*    CIPROFLOXACIN >=8 RESISTANT Resistant     ERYTHROMYCIN >=8 RESISTANT Resistant     GENTAMICIN <=0.5 SENSITIVE Sensitive     OXACILLIN >=4 RESISTANT Resistant     TETRACYCLINE <=1 SENSITIVE Sensitive     VANCOMYCIN <=0.5 SENSITIVE Sensitive     TRIMETH/SULFA >=320 RESISTANT Resistant     CLINDAMYCIN >=8 RESISTANT Resistant     RIFAMPIN <=0.5 SENSITIVE Sensitive     Inducible Clindamycin NEGATIVE Sensitive     * MODERATE METHICILLIN RESISTANT STAPHYLOCOCCUS AUREUS  Fungus Culture Result     Status: None   Collection Time: 02/17/22  6:05 PM  Result Value Ref  Range Status   Result 1 Comment  Final    Comment: (NOTE) KOH/Calcofluor preparation:  no fungus observed. Performed At: [facility] [address] [place], [place] [id number] Jolene Schimke MD [id number]   Fungus Culture Result     Status: None   Collection Time: 02/17/22  6:05 PM  Result Value Ref Range Status   Result 1 Comment  Final    Comment: (NOTE) KOH/Calcofluor preparation:  no fungus observed. Performed At: [facility] [address] [place], [place] [id number] Jolene Schimke MD XQ:[id number]   Aerobic Culture w Gram Stain (superficial specimen)     Status: None   Collection Time: 02/18/22  3:43 PM   Specimen: Head; Abscess  Result Value Ref Range Status   Specimen Description HEAD  Final   Special Requests Normal  Final   Gram Stain  Final    RARE WBC PRESENT,BOTH PMN AND MONONUCLEAR FEW GRAM POSITIVE COCCI IN PAIRS IN SINGLES Performed at [facility] Lab, [address], [place], [place] [address]    Culture   Final    MODERATE METHICILLIN RESISTANT STAPHYLOCOCCUS AUREUS   Report Status 02/20/2022 FINAL  Final   Organism ID, Bacteria METHICILLIN RESISTANT STAPHYLOCOCCUS AUREUS  Final      Susceptibility   Methicillin resistant staphylococcus aureus - MIC*    CIPROFLOXACIN >=8 RESISTANT Resistant     ERYTHROMYCIN >=8 RESISTANT Resistant     GENTAMICIN <=0.5 SENSITIVE Sensitive     OXACILLIN >=4 RESISTANT Resistant     TETRACYCLINE <=1 SENSITIVE Sensitive     VANCOMYCIN 1 SENSITIVE Sensitive     TRIMETH/SULFA >=320 RESISTANT Resistant     CLINDAMYCIN >=8 RESISTANT Resistant     RIFAMPIN <=0.5 SENSITIVE Sensitive     Inducible Clindamycin NEGATIVE Sensitive     * MODERATE METHICILLIN RESISTANT STAPHYLOCOCCUS AUREUS     Rexene Alberts, MSN, NP-C [facility]  [email or web address] Pager: [phone] Office: [phone] [facility] Main Line: [phone] *Secure Chat  Communication Welcome

## 2022-02-21 NOTE — Progress Notes (Signed)
Progress Note  Patient: Webster Liera [id number] DOB: 1976/03/07  DOA: 02/14/2022  DOS: 02/21/2022    Brief hospital course: Santwan Mcnelly is a 46 y.o. male with a history of methamphetamine use, incarceration, skin picking behavior, recurrent MRSA infections who presented with left knee pain and several areas of skin and soft tissue infections. Broad IV antibiotics were started, narrowed to vancomycin and the patient was admitted. Orthopedics consulted, performed bedside left knee I&D 9/8. Blood cultures have grown MRSA. TTE and then subsequent TEE without vegetation. Left knee washout in OR performed 9/10 noting deep abscess without joint involvement. Also underwent bedside I&D of left forehead abscess by plastics on 9/11. Continues on IV antibiotics, vancomycin switched to daptomycin on 9/12. Repeat blood cultures 9/10 are NGTD.  Assessment and Plan: Multifocal SSTI, MRSA bacteremia: No vegetation on TEE. - Continue IV antibiotics for 6 weeks per ID. Switching vancomycin to daptomycin for ease of dosing. Weekly CK ordered. - Note previous hospitalization for MRSA infection and patient is "unsure" whether he took oral doxycycline after discharge.  - Continue oral analgesic only. Adequate control.  - Repeat blood cultures 9/10 sent, NGTD - HIV NR July 2023, RPR NR, HCV and HPV SAg negative/NR. - Trend neutrophilic leukocytosis. Improving.  Left knee cellulitis and abscess: Intraarticular signal more consistent with reactive change, not suspected to be joint involvement per orthopedics.  - Orthopedics, Dr. Griffin Basil, performed operative I&D 9/10. Cultures MRSA.  - Distal LLE swelling improving, negative venous U/S for DVT.    Left forehead cellulitis and abscess: s/p bedside I&D 9/11 by plastics with 5cc purulent discharge collected. Culture +MRSA.  Left preseptal cellulitis: Significantly improved. Was discussed with ophthalmology, Dr. Posey Pronto on 9/9. Developing more superficial eruption  localized to left eyelid and nose on 9/14. Still no changes in vision, painful EOM, or proptosis.   Contact dermatitis: Possible heat rash superiorly to left knee. Pruritic without evidence of infection/fluid collection. Betadine irritation is possible though less likely as there are areas with betadine without irritation. - Start triamcinolone topically. Keep area dry.   Polysubstance abuse: UDS +amphetamine, THC.  - Cessation counseling provided. Consider re-enrollment in treatment program  - Precludes discharge with PICC line in place. - TOC consulted  Tobacco use:  - Cessation counseling provided  Hyponatremia: Mild  Hypokalemia: Mild. - Repeat supplementation today.  Urinary hesitancy: UA negative. Symptoms currently resolved. - Monitor clinically/prn bladder scan.   Subjective: Developing itchy red rash on lower left thigh over past 24 hours. Also noticing some redness with spots that are not itchy, slightly tender on left eyelid without vision changes.   Objective: Vitals:   02/20/22 0849 02/20/22 2116 02/21/22 0855 02/21/22 0922  BP: 126/81 (!) 141/84 127/83 134/86  Pulse: 75 84  77  Resp: 17 18 18 17   Temp: 97.9 F (36.6 C) 98.3 F (36.8 C) 98.1 F (36.7 C) 98.2 F (36.8 C)  TempSrc:  Oral Oral Oral  SpO2: 100% 100% 100% 99%  Weight:      Height:      Gen: 46 y.o. male in no distress Pulm: Nonlabored breathing room air. Clear. CV: Regular rate and rhythm. No murmur, rub, or gallop. No JVD, LLE dependent edema is improving. GI: Abdomen soft, non-tender, non-distended, with normoactive bowel sounds.  Ext: Warm, no deformities Skin: Left forehead wound still with purulent discharge, improving erythema. Right forehead also with some expressible purulence without residual fluctuance. Left eyelid as pictured below with no visible abnormality of left eye/globe. Maculopapular erythema extending  from posterior aspect of lower left thigh nearly circumferentially. No similar  rash elsewhere.   Neuro: Alert and oriented. No focal neurological deficits. Psych: Judgement and insight appear fair. Mood euthymic & affect congruent. Behavior is appropriate.        Data Personally reviewed: CBC: Recent Labs  Lab 02/15/22 0458 02/16/22 0050 02/17/22 0630 02/20/22 0132  WBC 15.9* 16.8* 13.3* 12.2*  NEUTROABS 13.2*  --  9.8*  --   HGB 14.4 11.4* 11.7* 11.4*  HCT 41.8 32.9* 33.5* 33.1*  MCV 97.2 97.3 95.4 97.1  PLT 312 262 277 370   Basic Metabolic Panel: Recent Labs  Lab 02/15/22 0458 02/16/22 0050 02/19/22 0143 02/20/22 0132  NA 132* 131* 140 139  K 3.4* 3.9 3.3* 3.2*  CL 95* 97* 102 104  CO2 27 22 24 27   GLUCOSE 126* 92 119* 120*  BUN 11 16 8 10   CREATININE 1.11 0.83 0.77 0.81  CALCIUM 9.4 8.5* 8.4* 8.3*   GFR: Estimated Creatinine Clearance: 125.6 mL/min (by C-G formula based on SCr of 0.81 mg/dL). Liver Function Tests: Recent Labs  Lab 02/15/22 0458  AST 19  ALT 18  ALKPHOS 64  BILITOT 0.7  PROT 7.3  ALBUMIN 3.7    Recent Results (from the past 240 hour(s))  Blood Culture (routine x 2)     Status: Abnormal   Collection Time: 02/15/22  4:58 AM   Specimen: BLOOD LEFT ARM  Result Value Ref Range Status   Specimen Description BLOOD LEFT ARM  Final   Special Requests   Final    BOTTLES DRAWN AEROBIC AND ANAEROBIC Blood Culture results may not be optimal due to an excessive volume of blood received in culture bottles   Culture  Setup Time   Final    GRAM POSITIVE COCCI IN CLUSTERS IN BOTH AEROBIC AND ANAEROBIC BOTTLES CRITICAL RESULT CALLED TO, READ BACK BY AND VERIFIED WITH: PHARMD FRANK ABBOTT ON 02/15/22 @ 2314 BY DRT Performed at [facility], [address], [place], [address] [place]    Culture METHICILLIN RESISTANT STAPHYLOCOCCUS AUREUS (A)  Final   Report Status 02/17/2022 FINAL  Final   Organism ID, Bacteria METHICILLIN RESISTANT STAPHYLOCOCCUS AUREUS  Final      Susceptibility   Methicillin resistant staphylococcus  aureus - MIC*    CIPROFLOXACIN >=8 RESISTANT Resistant     ERYTHROMYCIN >=8 RESISTANT Resistant     GENTAMICIN <=0.5 SENSITIVE Sensitive     OXACILLIN >=4 RESISTANT Resistant     TETRACYCLINE <=1 SENSITIVE Sensitive     VANCOMYCIN 1 SENSITIVE Sensitive     TRIMETH/SULFA >=320 RESISTANT Resistant     CLINDAMYCIN >=8 RESISTANT Resistant     RIFAMPIN <=0.5 SENSITIVE Sensitive     Inducible Clindamycin NEGATIVE Sensitive     * METHICILLIN RESISTANT STAPHYLOCOCCUS AUREUS  Blood Culture (routine x 2)     Status: Abnormal   Collection Time: 02/15/22  4:58 AM   Specimen: BLOOD RIGHT FOREARM  Result Value Ref Range Status   Specimen Description BLOOD RIGHT FOREARM  Final   Special Requests   Final    BOTTLES DRAWN AEROBIC AND ANAEROBIC Blood Culture results may not be optimal due to an excessive volume of blood received in culture bottles   Culture  Setup Time   Final    ANAEROBIC BOTTLE ONLY GRAM POSITIVE COCCI IN CLUSTERS CRITICAL VALUE NOTED.  VALUE IS CONSISTENT WITH PREVIOUSLY REPORTED AND CALLED VALUE.    Culture (A)  Final    STAPHYLOCOCCUS  AUREUS SUSCEPTIBILITIES PERFORMED ON PREVIOUS CULTURE WITHIN THE LAST 5 DAYS. Performed at [facility], [place] [address] [place], [place] [address]    Report Status 02/17/2022 FINAL  Final  Blood Culture ID Panel (Reflexed)     Status: Abnormal   Collection Time: 02/15/22  4:58 AM  Result Value Ref Range Status   Enterococcus faecalis NOT DETECTED NOT DETECTED Final   Enterococcus Faecium NOT DETECTED NOT DETECTED Final   Listeria monocytogenes NOT DETECTED NOT DETECTED Final   Staphylococcus species DETECTED (A) NOT DETECTED Final    Comment: CRITICAL RESULT CALLED TO, READ BACK BY AND VERIFIED WITH: PHARMD FRANK ABBOTT ON 02/15/22 @ 2314 BY DRT    Staphylococcus aureus (BCID) DETECTED (A) NOT DETECTED Final    Comment: Methicillin (oxacillin)-resistant Staphylococcus aureus (MRSA). MRSA is predictably resistant to beta-lactam  antibiotics (except ceftaroline). Preferred therapy is vancomycin unless clinically contraindicated. Patient requires contact precautions if  hospitalized. CRITICAL RESULT CALLED TO, READ BACK BY AND VERIFIED WITH: PHARMD FRANK ABBOTT ON 02/15/22 @ [id number] BY DRT    Staphylococcus epidermidis NOT DETECTED NOT DETECTED Final   Staphylococcus lugdunensis NOT DETECTED NOT DETECTED Final   Streptococcus species NOT DETECTED NOT DETECTED Final   Streptococcus agalactiae NOT DETECTED NOT DETECTED Final   Streptococcus pneumoniae NOT DETECTED NOT DETECTED Final   Streptococcus pyogenes NOT DETECTED NOT DETECTED Final   A.calcoaceticus-baumannii NOT DETECTED NOT DETECTED Final   Bacteroides fragilis NOT DETECTED NOT DETECTED Final   Enterobacterales NOT DETECTED NOT DETECTED Final   Enterobacter cloacae complex NOT DETECTED NOT DETECTED Final   Escherichia coli NOT DETECTED NOT DETECTED Final   Klebsiella aerogenes NOT DETECTED NOT DETECTED Final   Klebsiella oxytoca NOT DETECTED NOT DETECTED Final   Klebsiella pneumoniae NOT DETECTED NOT DETECTED Final   Proteus species NOT DETECTED NOT DETECTED Final   Salmonella species NOT DETECTED NOT DETECTED Final   Serratia marcescens NOT DETECTED NOT DETECTED Final   Haemophilus influenzae NOT DETECTED NOT DETECTED Final   Neisseria meningitidis NOT DETECTED NOT DETECTED Final   Pseudomonas aeruginosa NOT DETECTED NOT DETECTED Final   Stenotrophomonas maltophilia NOT DETECTED NOT DETECTED Final   Candida albicans NOT DETECTED NOT DETECTED Final   Candida auris NOT DETECTED NOT DETECTED Final   Candida glabrata NOT DETECTED NOT DETECTED Final   Candida krusei NOT DETECTED NOT DETECTED Final   Candida parapsilosis NOT DETECTED NOT DETECTED Final   Candida tropicalis NOT DETECTED NOT DETECTED Final   Cryptococcus neoformans/gattii NOT DETECTED NOT DETECTED Final   Meth resistant mecA/C and MREJ DETECTED (A) NOT DETECTED Final    Comment: CRITICAL RESULT  CALLED TO, READ BACK BY AND VERIFIED WITH: PHARMD FRANK ABBOTT ON 02/15/22 @ [id number] BY DRT Performed at [facility], [address] [place], [place] [address]   Aerobic Culture w Gram Stain (superficial specimen)     Status: None   Collection Time: 02/15/22 10:20 AM   Specimen: KNEE  Result Value Ref Range Status   Specimen Description KNEE  Final   Special Requests NONE  Final   Gram Stain   Final    NO WBC SEEN RARE GRAM POSITIVE COCCI Performed at [facility], [place] [address], [place],  [address]    Culture   Final    MODERATE METHICILLIN RESISTANT STAPHYLOCOCCUS AUREUS   Report Status 02/17/2022 FINAL  Final   Organism ID, Bacteria METHICILLIN RESISTANT STAPHYLOCOCCUS AUREUS  Final      Susceptibility  Methicillin resistant staphylococcus aureus - MIC*    CIPROFLOXACIN >=8 RESISTANT Resistant     ERYTHROMYCIN >=8 RESISTANT Resistant     GENTAMICIN <=0.5 SENSITIVE Sensitive     OXACILLIN >=4 RESISTANT Resistant     TETRACYCLINE <=1 SENSITIVE Sensitive     VANCOMYCIN 1 SENSITIVE Sensitive     TRIMETH/SULFA >=320 RESISTANT Resistant     CLINDAMYCIN >=8 RESISTANT Resistant     RIFAMPIN <=0.5 SENSITIVE Sensitive     Inducible Clindamycin NEGATIVE Sensitive     * MODERATE METHICILLIN RESISTANT STAPHYLOCOCCUS AUREUS  Culture, blood (Routine X 2) w Reflex to ID Panel     Status: None (Preliminary result)   Collection Time: 02/17/22  6:29 AM   Specimen: BLOOD  Result Value Ref Range Status   Specimen Description BLOOD RIGHT ANTECUBITAL  Final   Special Requests   Final    BOTTLES DRAWN AEROBIC AND ANAEROBIC Blood Culture adequate volume   Culture   Final    NO GROWTH 4 DAYS Performed at [facility], [address], [place], [place] [address]    Report Status PENDING  Incomplete  Culture, blood (Routine X 2) w Reflex to ID Panel     Status: None (Preliminary result)   Collection Time: 02/17/22  6:30 AM   Specimen: BLOOD  Result Value Ref Range Status    Specimen Description BLOOD LEFT ANTECUBITAL  Final   Special Requests   Final    BOTTLES DRAWN AEROBIC AND ANAEROBIC Blood Culture adequate volume   Culture   Final    NO GROWTH 4 DAYS Performed at [facility], [address], [place], [place] [address]    Report Status PENDING  Incomplete  Surgical pcr screen     Status: Abnormal   Collection Time: 02/17/22 11:30 AM   Specimen: Nasal Mucosa; Nasal Swab  Result Value Ref Range Status   MRSA, PCR POSITIVE (A) NEGATIVE Final   Staphylococcus aureus POSITIVE (A) NEGATIVE Final    Comment: RESULT CALLED TO, READ BACK BY AND VERIFIED WITH: 02/17/2022 @ 1605 RN ANGELITO, ADC (NOTE) The Xpert SA Assay (FDA approved for NASAL specimens in patients 66 years of age and older), is one component of a comprehensive surveillance program. It is not intended to diagnose infection nor to guide or monitor treatment. Performed at [facility], [address], [place], [place] [address]   Fungus Culture With Stain     Status: None (Preliminary result)   Collection Time: 02/17/22  6:05 PM   Specimen: Abscess  Result Value Ref Range Status   Fungus Stain Final report  Final    Comment: (NOTE) Performed At: [facility] [address] [place], [place] [id number] Jolene Schimke MD ZW:[id number]    Fungus (Mycology) Culture PENDING  Incomplete   Fungal Source ABSCESS  Final    Comment: LEFT KNEE Performed at [facility], [address], [place], [place] [address]   Fungus Culture With Stain     Status: None (Preliminary result)   Collection Time: 02/17/22  6:05 PM   Specimen: Abscess  Result Value Ref Range Status   Fungus Stain Final report  Final    Comment: (NOTE) Performed At: [facility] [address] [place], [place] [id number] Jolene Schimke MD [id number]    Fungus (Mycology) Culture PENDING  Incomplete   Fungal Source ABSCESS  Final    Comment: LEFT KNEE Performed at [facility], [address] [place],  [place] [address]   Aerobic/Anaerobic Culture w Gram Stain (surgical/deep wound)     Status: None (Preliminary result)   Collection Time: 02/17/22  6:05 PM   Specimen: Abscess  Result Value Ref Range Status   Specimen Description ABSCESS LEFT KNEE  Final   Special Requests SPECIMEN A  Final   Gram Stain   Final    MODERATE WBC PRESENT,BOTH PMN AND MONONUCLEAR FEW GRAM POSITIVE COCCI IN CLUSTERS RARE GRAM POSITIVE COCCI IN PAIRS Performed at [facility], [place] [address], [place], [place] [address]    Culture   Final    ABUNDANT METHICILLIN RESISTANT STAPHYLOCOCCUS AUREUS NO ANAEROBES ISOLATED; CULTURE IN PROGRESS FOR 5 DAYS    Report Status PENDING  Incomplete   Organism ID, Bacteria METHICILLIN RESISTANT STAPHYLOCOCCUS AUREUS  Final      Susceptibility   Methicillin resistant staphylococcus aureus - MIC*    CIPROFLOXACIN >=8 RESISTANT Resistant     ERYTHROMYCIN >=8 RESISTANT Resistant     GENTAMICIN <=0.5 SENSITIVE Sensitive     OXACILLIN >=4 RESISTANT Resistant     TETRACYCLINE <=1 SENSITIVE Sensitive     VANCOMYCIN <=0.5 SENSITIVE Sensitive     TRIMETH/SULFA >=320 RESISTANT Resistant     CLINDAMYCIN >=8 RESISTANT Resistant     RIFAMPIN <=0.5 SENSITIVE Sensitive     Inducible Clindamycin NEGATIVE Sensitive     * ABUNDANT METHICILLIN RESISTANT STAPHYLOCOCCUS AUREUS  Aerobic/Anaerobic Culture w Gram Stain (surgical/deep wound)     Status: None (Preliminary result)   Collection Time: 02/17/22  6:05 PM   Specimen: Abscess  Result Value Ref Range Status   Specimen Description ABSCESS LEFT KNEE  Final   Special Requests SPECIMEN B  Final   Gram Stain   Final    FEW WBC PRESENT,BOTH PMN AND MONONUCLEAR RARE GRAM POSITIVE COCCI IN PAIRS Performed at [facility], [address] [place], [place] [address]    Culture   Final    MODERATE METHICILLIN RESISTANT STAPHYLOCOCCUS AUREUS NO ANAEROBES ISOLATED; CULTURE IN PROGRESS FOR 5 DAYS     Report Status PENDING  Incomplete   Organism ID, Bacteria METHICILLIN RESISTANT STAPHYLOCOCCUS AUREUS  Final      Susceptibility   Methicillin resistant staphylococcus aureus - MIC*    CIPROFLOXACIN >=8 RESISTANT Resistant     ERYTHROMYCIN >=8 RESISTANT Resistant     GENTAMICIN <=0.5 SENSITIVE Sensitive     OXACILLIN >=4 RESISTANT Resistant     TETRACYCLINE <=1 SENSITIVE Sensitive     VANCOMYCIN <=0.5 SENSITIVE Sensitive     TRIMETH/SULFA >=320 RESISTANT Resistant     CLINDAMYCIN >=8 RESISTANT Resistant     RIFAMPIN <=0.5 SENSITIVE Sensitive     Inducible Clindamycin NEGATIVE Sensitive     * MODERATE METHICILLIN RESISTANT STAPHYLOCOCCUS AUREUS  Fungus Culture Result     Status: None   Collection Time: 02/17/22  6:05 PM  Result Value Ref Range Status   Result 1 Comment  Final    Comment: (NOTE) KOH/Calcofluor preparation:  no fungus observed. Performed At: [facility] [place], [place] [id number] Rush Farmer MD [id number]   Fungus Culture Result     Status: None   Collection Time: 02/17/22  6:05 PM  Result Value Ref Range Status   Result 1 Comment  Final    Comment: (NOTE) KOH/Calcofluor preparation:  no fungus observed. Performed At: [facility] [place], [place] [id number] Rush Farmer MD [id number]   Aerobic Culture w Gram Stain (superficial specimen)  Status: None   Collection Time: 02/18/22  3:43 PM   Specimen: Head; Abscess  Result Value Ref Range Status   Specimen Description HEAD  Final   Special Requests Normal  Final   Gram Stain   Final    RARE WBC PRESENT,BOTH PMN AND MONONUCLEAR FEW GRAM POSITIVE COCCI IN PAIRS IN SINGLES Performed at [facility], [place] [address] [place], [place] [address]    Culture   Final    MODERATE METHICILLIN RESISTANT STAPHYLOCOCCUS AUREUS   Report Status 02/20/2022 FINAL  Final   Organism ID, Bacteria METHICILLIN RESISTANT STAPHYLOCOCCUS AUREUS  Final       Susceptibility   Methicillin resistant staphylococcus aureus - MIC*    CIPROFLOXACIN >=8 RESISTANT Resistant     ERYTHROMYCIN >=8 RESISTANT Resistant     GENTAMICIN <=0.5 SENSITIVE Sensitive     OXACILLIN >=4 RESISTANT Resistant     TETRACYCLINE <=1 SENSITIVE Sensitive     VANCOMYCIN 1 SENSITIVE Sensitive     TRIMETH/SULFA >=320 RESISTANT Resistant     CLINDAMYCIN >=8 RESISTANT Resistant     RIFAMPIN <=0.5 SENSITIVE Sensitive     Inducible Clindamycin NEGATIVE Sensitive     * MODERATE METHICILLIN RESISTANT STAPHYLOCOCCUS AUREUS     Family Communication: None at bedside  Disposition: Status is: Inpatient Remains inpatient appropriate because: Continued IV antibiotics for MRSA bacteremia. ID recommends 6 weeks inpatient IV antibiotics from time of left knee operative I&D on 9/10.  Planned Discharge Destination: Home  Patrecia Pour, MD 02/21/2022 11:12 AM Page by [email or web address]

## 2022-02-21 NOTE — Progress Notes (Signed)
CSW received consult for substance use for patient. CSW spoke with patient. CSW offered patient outpatient substance use treatment services resources.Patient accepted. All questions answered. No further questions reported at this time.

## 2022-02-21 NOTE — Progress Notes (Signed)
Mobility Specialist Progress Note   02/21/22 1742  Mobility  Activity Ambulated independently in hallway  Level of Assistance Independent after set-up  Assistive Device None  LLE Weight Bearing WBAT  Distance Ambulated (ft) 550 ft  Activity Response Tolerated well  $Mobility charge 1 Mobility   Received pt in bed stating no pain and agreeable to mobility. Pt ambulating w/ antalgic like gait d/t stiffness in L knee, having no complaints of pain and able to return to room w/o fault. Left back in bed w/ call bell in reach and all needs met.  Holland Falling Mobility Specialist MS [facility] #:  [phone] Acute Rehab Office:  [phone]

## 2022-02-22 ENCOUNTER — Inpatient Hospital Stay (HOSPITAL_COMMUNITY): Payer: Self-pay

## 2022-02-22 DIAGNOSIS — L03213 Periorbital cellulitis: Secondary | ICD-10-CM

## 2022-02-22 LAB — AEROBIC/ANAEROBIC CULTURE W GRAM STAIN (SURGICAL/DEEP WOUND)

## 2022-02-22 LAB — CBC
HCT: 37.2 % — ABNORMAL LOW (ref 39.0–52.0)
Hemoglobin: 12.1 g/dL — ABNORMAL LOW (ref 13.0–17.0)
MCH: 32.4 pg (ref 26.0–34.0)
MCHC: 32.5 g/dL (ref 30.0–36.0)
MCV: 99.5 fL (ref 80.0–100.0)
Platelets: 490 10*3/uL — ABNORMAL HIGH (ref 150–400)
RBC: 3.74 MIL/uL — ABNORMAL LOW (ref 4.22–5.81)
RDW: 12.4 % (ref 11.5–15.5)
WBC: 13.3 10*3/uL — ABNORMAL HIGH (ref 4.0–10.5)
nRBC: 0 % (ref 0.0–0.2)

## 2022-02-22 LAB — CULTURE, BLOOD (ROUTINE X 2)
Culture: NO GROWTH
Culture: NO GROWTH
Special Requests: ADEQUATE
Special Requests: ADEQUATE

## 2022-02-22 LAB — BASIC METABOLIC PANEL
Anion gap: 10 (ref 5–15)
BUN: 14 mg/dL (ref 6–20)
CO2: 24 mmol/L (ref 22–32)
Calcium: 8.7 mg/dL — ABNORMAL LOW (ref 8.9–10.3)
Chloride: 104 mmol/L (ref 98–111)
Creatinine, Ser: 0.74 mg/dL (ref 0.61–1.24)
GFR, Estimated: >60 mL/min (ref >60)
Glucose, Bld: 97 mg/dL (ref 70–99)
Potassium: 3.8 mmol/L (ref 3.5–5.1)
Sodium: 138 mmol/L (ref 135–145)

## 2022-02-22 MED ORDER — IOHEXOL 350 MG/ML SOLN
70.0000 mL | Freq: Once | INTRAVENOUS | Status: AC | PRN
  Administered 2022-02-22: 70 mL via INTRAVENOUS

## 2022-02-22 NOTE — Progress Notes (Signed)
[facility] PROGRESS NOTE   Obed Samek YTK:[id number] DOB: 16-Jan-1976 DOA: 02/14/2022  PCP: Patient, No Pcp Per  Brief History/Interval Summary: 46 y.o. male with a history of methamphetamine use, incarceration, skin picking behavior, recurrent MRSA infections who presented with left knee pain and several areas of skin and soft tissue infections. Broad IV antibiotics were started, narrowed to vancomycin and the patient was admitted. Orthopedics consulted, performed bedside left knee I&D 9/8. Blood cultures have grown MRSA. TTE and then subsequent TEE without vegetation. Left knee washout in OR performed 9/10 noting deep abscess without joint involvement. Also underwent bedside I&D of left forehead abscess by plastics on 9/11. Continues on IV antibiotics, vancomycin switched to daptomycin on 9/12. Repeat blood cultures 9/10 are NGTD.  Consultants: Infectious disease.  Orthopedics.  Plastic surgery.  Phone discussion with ophthalmology.     Subjective/Interval History: Patient mentions that his left eye swelling seems to be worsening.  Denies any change in vision.  No significant pain.  No nausea vomiting.    Assessment/Plan:  Multifocal SSTI, MRSA bacteremia No vegetation on TEE. - Continue IV antibiotics for 6 weeks per ID. Switching vancomycin to daptomycin for ease of dosing. Weekly CK ordered. - Note previous hospitalization for MRSA infection and patient is "unsure" whether he took oral doxycycline after discharge.  - Continue oral analgesic only. Adequate control.  - Repeat blood cultures 9/10 sent, NGTD - HIV NR July 2023, RPR NR, HCV and HPV SAg negative/NR. WBC has improved.  Remains afebrile.   Left knee cellulitis and abscess Intraarticular signal more consistent with reactive change, not suspected to be joint involvement per orthopedics.  - Orthopedics, Dr. Everardo Pacific, performed operative I&D 9/10. Cultures MRSA.  - Distal LLE swelling improving, negative venous  U/S for DVT.     Left forehead cellulitis and abscess S/p bedside I&D 9/11 by plastics with 5cc purulent discharge collected. Culture +MRSA.   Left preseptal cellulitis Was discussed with ophthalmology, Dr. Allena Katz on 9/9. Developing more superficial eruption localized to left eyelid and nose on 9/14. Still no changes in vision, painful EOM, or proptosis.  Patient has noted more swelling of the eyelid.  No surrounding erythema or warmth is noted.  Extraocular movements noted to be normal.  We will proceed with CT scan of the orbits to further characterize the area.   Contact dermatitis Possible heat rash superiorly to left knee. Pruritic without evidence of infection/fluid collection. Betadine irritation is possible though less likely as there are areas with betadine without irritation. - triamcinolone topically. Keep area dry.    Polysubstance abuse UDS +amphetamine, THC.  - Cessation counseling provided. Consider re-enrollment in treatment program  - Precludes discharge with PICC line in place. - TOC consulted   Tobacco use:  - Cessation counseling provided   Hyponatremia Resolved   Hypokalemia Supplemented.   Urinary hesitancy : UA negative. Symptoms currently resolved. - Monitor clinically/prn bladder scan.    DVT Prophylaxis: Lovenox Code Status: Full code Family Communication: Discussed with patient Disposition Plan: We will stay in the hospital till completion of antibiotic therapy  Status is: Inpatient Remains inpatient appropriate because: MRSA bacteremia      Medications: Scheduled:  celecoxib  100 mg Oral BID   chlorhexidine  60 mL Topical Once   Chlorhexidine Gluconate Cloth  6 each Topical Q0600   docusate sodium  100 mg Oral BID   enoxaparin (LOVENOX) injection  40 mg Subcutaneous Q24H   lidocaine-EPINEPHrine  20 mL Infiltration Once   mupirocin ointment  Topical BID   povidone-iodine  2 Application Topical Once   sodium chloride flush  3 mL  Intravenous Q12H   triamcinolone cream   Topical TID   Continuous:  DAPTOmycin (CUBICIN) 600 mg in sodium chloride 0.9 % IVPB 600 mg (02/21/22 2240)   QPY:PPJKDTOIZ, hydrALAZINE, methocarbamol, ondansetron **OR** ondansetron (ZOFRAN) IV, oxyCODONE, polyethylene glycol  Antibiotics: Anti-infectives (From admission, onward)    Start     Dose/Rate Route Frequency Ordered Stop   02/19/22 2000  DAPTOmycin (CUBICIN) 600 mg in sodium chloride 0.9 % IVPB        8 mg/kg  77.1 kg 124 mL/hr over 30 Minutes Intravenous Daily 02/19/22 0931 04/01/22 2359   02/19/22 0600  ceFAZolin (ANCEF) IVPB 2g/100 mL premix        2 g 200 mL/hr over 30 Minutes Intravenous On call to O.R. 02/18/22 1146 02/19/22 0551   02/16/22 2200  vancomycin (VANCOREADY) IVPB 1500 mg/300 mL        1,500 mg 150 mL/hr over 120 Minutes Intravenous Every 12 hours 02/16/22 1123 02/19/22 1200   02/15/22 1730  vancomycin (VANCOCIN) IVPB 1000 mg/200 mL premix  Status:  Discontinued        1,000 mg 200 mL/hr over 60 Minutes Intravenous Every 12 hours 02/15/22 0658 02/15/22 0658   02/15/22 1730  vancomycin (VANCOCIN) IVPB 1000 mg/200 mL premix  Status:  Discontinued        1,000 mg 200 mL/hr over 60 Minutes Intravenous Every 12 hours 02/15/22 0658 02/16/22 1123   02/15/22 0445  vancomycin (VANCOREADY) IVPB 1500 mg/300 mL        1,500 mg 150 mL/hr over 120 Minutes Intravenous  Once 02/15/22 0442 02/15/22 0836       Objective:  Vital Signs  Vitals:   02/21/22 2221 02/22/22 0553 02/22/22 0907 02/22/22 1000  BP: (!) 139/90 135/87 122/84 120/88  Pulse: 78 69 68 67  Resp:  20 16 20   Temp: 98.3 F (36.8 C) 98.6 F (37 C) 98.9 F (37.2 C) (!) 97 F (36.1 C)  TempSrc: Oral Oral Oral Oral  SpO2:  99% 99% 100%  Weight:      Height:        Intake/Output Summary (Last 24 hours) at 02/22/2022 1211 Last data filed at 02/22/2022 0555 Gross per 24 hour  Intake --  Output 1550 ml  Net -1550 ml   Filed Weights   02/15/22 0454  02/17/22 1715  Weight: 77.1 kg 77.1 kg    General appearance: Awake alert.  In no distress Swelling of the left eyelid.  Crusted lesions noted over the eyelid. Resp: Clear to auscultation bilaterally.  Normal effort Cardio: S1-S2 is normal regular.  No S3-S4.  No rubs murmurs or bruit GI: Abdomen is soft.  Nontender nondistended.  Bowel sounds are present normal.  No masses organomegaly Extremities: No edema.  Full range of motion of lower extremities. Neurologic: Alert and oriented x3.  No focal neurological deficits.    Lab Results:  Data Reviewed: I have personally reviewed following labs and reports of the imaging studies  CBC: Recent Labs  Lab 02/16/22 0050 02/17/22 0630 02/20/22 0132 02/22/22 0053  WBC 16.8* 13.3* 12.2* 13.3*  NEUTROABS  --  9.8*  --   --   HGB 11.4* 11.7* 11.4* 12.1*  HCT 32.9* 33.5* 33.1* 37.2*  MCV 97.3 95.4 97.1 99.5  PLT 262 277 370 490*    Basic Metabolic Panel: Recent Labs  Lab 02/16/22 0050 02/19/22 0143 02/20/22 0132  02/22/22 0053  NA 131* 140 139 138  K 3.9 3.3* 3.2* 3.8  CL 97* 102 104 104  CO2 GLUCOSE 92 119* 120* 97  BUN CREATININE 0.83 0.77 0.81 0.74  CALCIUM 8.5* 8.4* 8.3* 8.7*    GFR: Estimated Creatinine Clearance: 127.2 mL/min (by C-G formula based on SCr of 0.74 mg/dL).  Cardiac Enzymes: Recent Labs  Lab 02/19/22 0143  CKTOTAL 55     Recent Results (from the past 240 hour(s))  Blood Culture (routine x 2)     Status: Abnormal   Collection Time: 02/15/22  4:58 AM   Specimen: BLOOD LEFT ARM  Result Value Ref Range Status   Specimen Description BLOOD LEFT ARM  Final   Special Requests   Final    BOTTLES DRAWN AEROBIC AND ANAEROBIC Blood Culture results may not be optimal due to an excessive volume of blood received in culture bottles   Culture  Setup Time   Final    GRAM POSITIVE COCCI IN CLUSTERS IN BOTH AEROBIC AND ANAEROBIC BOTTLES CRITICAL RESULT CALLED TO, READ BACK BY AND  VERIFIED WITH: PHARMD FRANK ABBOTT ON 02/15/22 @ 2314 BY DRT Performed at [facility] Lab, [address], [place], [place] [address]    Culture METHICILLIN RESISTANT STAPHYLOCOCCUS AUREUS (A)  Final   Report Status 02/17/2022 FINAL  Final   Organism ID, Bacteria METHICILLIN RESISTANT STAPHYLOCOCCUS AUREUS  Final      Susceptibility   Methicillin resistant staphylococcus aureus - MIC*    CIPROFLOXACIN >=8 RESISTANT Resistant     ERYTHROMYCIN >=8 RESISTANT Resistant     GENTAMICIN <=0.5 SENSITIVE Sensitive     OXACILLIN >=4 RESISTANT Resistant     TETRACYCLINE <=1 SENSITIVE Sensitive     VANCOMYCIN 1 SENSITIVE Sensitive     TRIMETH/SULFA >=320 RESISTANT Resistant     CLINDAMYCIN >=8 RESISTANT Resistant     RIFAMPIN <=0.5 SENSITIVE Sensitive     Inducible Clindamycin NEGATIVE Sensitive     * METHICILLIN RESISTANT STAPHYLOCOCCUS AUREUS  Blood Culture (routine x 2)     Status: Abnormal   Collection Time: 02/15/22  4:58 AM   Specimen: BLOOD RIGHT FOREARM  Result Value Ref Range Status   Specimen Description BLOOD RIGHT FOREARM  Final   Special Requests   Final    BOTTLES DRAWN AEROBIC AND ANAEROBIC Blood Culture results may not be optimal due to an excessive volume of blood received in culture bottles   Culture  Setup Time   Final    ANAEROBIC BOTTLE ONLY GRAM POSITIVE COCCI IN CLUSTERS CRITICAL VALUE NOTED.  VALUE IS CONSISTENT WITH PREVIOUSLY REPORTED AND CALLED VALUE.    Culture (A)  Final    STAPHYLOCOCCUS AUREUS SUSCEPTIBILITIES PERFORMED ON PREVIOUS CULTURE WITHIN THE LAST 5 DAYS. Performed at [facility], [address] [place], [place] [address]    Report Status 02/17/2022 FINAL  Final  Blood Culture ID Panel (Reflexed)     Status: Abnormal   Collection Time: 02/15/22  4:58 AM  Result Value Ref Range Status   Enterococcus faecalis NOT DETECTED NOT DETECTED Final   Enterococcus Faecium NOT DETECTED NOT DETECTED Final   Listeria monocytogenes NOT DETECTED NOT  DETECTED Final   Staphylococcus species DETECTED (A) NOT DETECTED Final    Comment: CRITICAL RESULT CALLED TO, READ BACK BY AND VERIFIED WITH: PHARMD FRANK ABBOTT ON 02/15/22 @ 2314 BY DRT    Staphylococcus aureus (BCID) DETECTED (A) NOT DETECTED  Final    Comment: Methicillin (oxacillin)-resistant Staphylococcus aureus (MRSA). MRSA is predictably resistant to beta-lactam antibiotics (except ceftaroline). Preferred therapy is vancomycin unless clinically contraindicated. Patient requires contact precautions if  hospitalized. CRITICAL RESULT CALLED TO, READ BACK BY AND VERIFIED WITH: PHARMD FRANK ABBOTT ON 02/15/22 @ 2314 BY DRT    Staphylococcus epidermidis NOT DETECTED NOT DETECTED Final   Staphylococcus lugdunensis NOT DETECTED NOT DETECTED Final   Streptococcus species NOT DETECTED NOT DETECTED Final   Streptococcus agalactiae NOT DETECTED NOT DETECTED Final   Streptococcus pneumoniae NOT DETECTED NOT DETECTED Final   Streptococcus pyogenes NOT DETECTED NOT DETECTED Final   A.calcoaceticus-baumannii NOT DETECTED NOT DETECTED Final   Bacteroides fragilis NOT DETECTED NOT DETECTED Final   Enterobacterales NOT DETECTED NOT DETECTED Final   Enterobacter cloacae complex NOT DETECTED NOT DETECTED Final   Escherichia coli NOT DETECTED NOT DETECTED Final   Klebsiella aerogenes NOT DETECTED NOT DETECTED Final   Klebsiella oxytoca NOT DETECTED NOT DETECTED Final   Klebsiella pneumoniae NOT DETECTED NOT DETECTED Final   Proteus species NOT DETECTED NOT DETECTED Final   Salmonella species NOT DETECTED NOT DETECTED Final   Serratia marcescens NOT DETECTED NOT DETECTED Final   Haemophilus influenzae NOT DETECTED NOT DETECTED Final   Neisseria meningitidis NOT DETECTED NOT DETECTED Final   Pseudomonas aeruginosa NOT DETECTED NOT DETECTED Final   Stenotrophomonas maltophilia NOT DETECTED NOT DETECTED Final   Candida albicans NOT DETECTED NOT DETECTED Final   Candida auris NOT DETECTED NOT DETECTED  Final   Candida glabrata NOT DETECTED NOT DETECTED Final   Candida krusei NOT DETECTED NOT DETECTED Final   Candida parapsilosis NOT DETECTED NOT DETECTED Final   Candida tropicalis NOT DETECTED NOT DETECTED Final   Cryptococcus neoformans/gattii NOT DETECTED NOT DETECTED Final   Meth resistant mecA/C and MREJ DETECTED (A) NOT DETECTED Final    Comment: CRITICAL RESULT CALLED TO, READ BACK BY AND VERIFIED WITH: PHARMD FRANK ABBOTT ON 02/15/22 @ 2314 BY DRT Performed at [facility] Lab, [address], [place], [place] [address]   Aerobic Culture w Gram Stain (superficial specimen)     Status: None   Collection Time: 02/15/22 10:20 AM   Specimen: KNEE  Result Value Ref Range Status   Specimen Description KNEE  Final   Special Requests NONE  Final   Gram Stain   Final    NO WBC SEEN RARE GRAM POSITIVE COCCI Performed at [facility], [address] [place], [place] [address]    Culture   Final    MODERATE METHICILLIN RESISTANT STAPHYLOCOCCUS AUREUS   Report Status 02/17/2022 FINAL  Final   Organism ID, Bacteria METHICILLIN RESISTANT STAPHYLOCOCCUS AUREUS  Final      Susceptibility   Methicillin resistant staphylococcus aureus - MIC*    CIPROFLOXACIN >=8 RESISTANT Resistant     ERYTHROMYCIN >=8 RESISTANT Resistant     GENTAMICIN <=0.5 SENSITIVE Sensitive     OXACILLIN >=4 RESISTANT Resistant     TETRACYCLINE <=1 SENSITIVE Sensitive     VANCOMYCIN 1 SENSITIVE Sensitive     TRIMETH/SULFA >=320 RESISTANT Resistant     CLINDAMYCIN >=8 RESISTANT Resistant     RIFAMPIN <=0.5 SENSITIVE Sensitive     Inducible Clindamycin NEGATIVE Sensitive     * MODERATE METHICILLIN RESISTANT STAPHYLOCOCCUS AUREUS  Culture, blood (Routine X 2) w Reflex to ID Panel     Status: None   Collection Time: 02/17/22  6:29 AM   Specimen: BLOOD  Result Value Ref Range  Status   Specimen Description BLOOD RIGHT ANTECUBITAL  Final   Special Requests   Final    BOTTLES DRAWN AEROBIC AND ANAEROBIC Blood  Culture adequate volume   Culture   Final    NO GROWTH 5 DAYS Performed at [facility], [address] [place], [place] [address]    Report Status 02/22/2022 FINAL  Final  Culture, blood (Routine X 2) w Reflex to ID Panel     Status: None   Collection Time: 02/17/22  6:30 AM   Specimen: BLOOD  Result Value Ref Range Status   Specimen Description BLOOD LEFT ANTECUBITAL  Final   Special Requests   Final    BOTTLES DRAWN AEROBIC AND ANAEROBIC Blood Culture adequate volume   Culture   Final    NO GROWTH 5 DAYS Performed at [facility], [address] [place], [place] [address]    Report Status 02/22/2022 FINAL  Final  Surgical pcr screen     Status: Abnormal   Collection Time: 02/17/22 11:30 AM   Specimen: Nasal Mucosa; Nasal Swab  Result Value Ref Range Status   MRSA, PCR POSITIVE (A) NEGATIVE Final   Staphylococcus aureus POSITIVE (A) NEGATIVE Final    Comment: RESULT CALLED TO, READ BACK BY AND VERIFIED WITH: 02/17/2022 @ 1605 RN ANGELITO, ADC (NOTE) The Xpert SA Assay (FDA approved for NASAL specimens in patients 85 years of age and older), is one component of a comprehensive surveillance program. It is not intended to diagnose infection nor to guide or monitor treatment. Performed at [facility], [address] [place], [place] [address]   Fungus Culture With Stain     Status: None (Preliminary result)   Collection Time: 02/17/22  6:05 PM   Specimen: Abscess  Result Value Ref Range Status   Fungus Stain Final report  Final    Comment: (NOTE) Performed At: [facility] [address] [place], [place] [id number] Jolene Schimke MD JI:[id number]    Fungus (Mycology) Culture PENDING  Incomplete   Fungal Source ABSCESS  Final    Comment: LEFT KNEE Performed at [facility], [address], [place], [place] [address]   Fungus Culture With Stain     Status: None (Preliminary result)   Collection Time: 02/17/22  6:05 PM   Specimen:  Abscess  Result Value Ref Range Status   Fungus Stain Final report  Final    Comment: (NOTE) Performed At: [facility] [address] [place], [place] [id number] Jolene Schimke MD [id number]    Fungus (Mycology) Culture PENDING  Incomplete   Fungal Source ABSCESS  Final    Comment: LEFT KNEE Performed at [facility], [address] [place], [place] [address]   Aerobic/Anaerobic Culture w Gram Stain (surgical/deep wound)     Status: None (Preliminary result)   Collection Time: 02/17/22  6:05 PM   Specimen: Abscess  Result Value Ref Range Status   Specimen Description ABSCESS LEFT KNEE  Final   Special Requests SPECIMEN A  Final   Gram Stain   Final    MODERATE WBC PRESENT,BOTH PMN AND MONONUCLEAR FEW GRAM POSITIVE COCCI IN CLUSTERS RARE GRAM POSITIVE COCCI IN PAIRS Performed at [facility], [address], [place], [place] [address]    Culture   Final    ABUNDANT METHICILLIN RESISTANT STAPHYLOCOCCUS AUREUS NO ANAEROBES ISOLATED; CULTURE IN PROGRESS FOR 5 DAYS    Report Status PENDING  Incomplete   Organism ID,  Bacteria METHICILLIN RESISTANT STAPHYLOCOCCUS AUREUS  Final      Susceptibility   Methicillin resistant staphylococcus aureus - MIC*    CIPROFLOXACIN >=8 RESISTANT Resistant     ERYTHROMYCIN >=8 RESISTANT Resistant     GENTAMICIN <=0.5 SENSITIVE Sensitive     OXACILLIN >=4 RESISTANT Resistant     TETRACYCLINE <=1 SENSITIVE Sensitive     VANCOMYCIN <=0.5 SENSITIVE Sensitive     TRIMETH/SULFA >=320 RESISTANT Resistant     CLINDAMYCIN >=8 RESISTANT Resistant     RIFAMPIN <=0.5 SENSITIVE Sensitive     Inducible Clindamycin NEGATIVE Sensitive     * ABUNDANT METHICILLIN RESISTANT STAPHYLOCOCCUS AUREUS  Aerobic/Anaerobic Culture w Gram Stain (surgical/deep wound)     Status: None (Preliminary result)   Collection Time: 02/17/22  6:05 PM   Specimen: Abscess  Result Value Ref Range Status   Specimen Description ABSCESS LEFT KNEE  Final    Special Requests SPECIMEN B  Final   Gram Stain   Final    FEW WBC PRESENT,BOTH PMN AND MONONUCLEAR RARE GRAM POSITIVE COCCI IN PAIRS Performed at [facility] Lab, [address] [place], [place] [address]    Culture   Final    MODERATE METHICILLIN RESISTANT STAPHYLOCOCCUS AUREUS NO ANAEROBES ISOLATED; CULTURE IN PROGRESS FOR 5 DAYS    Report Status PENDING  Incomplete   Organism ID, Bacteria METHICILLIN RESISTANT STAPHYLOCOCCUS AUREUS  Final      Susceptibility   Methicillin resistant staphylococcus aureus - MIC*    CIPROFLOXACIN >=8 RESISTANT Resistant     ERYTHROMYCIN >=8 RESISTANT Resistant     GENTAMICIN <=0.5 SENSITIVE Sensitive     OXACILLIN >=4 RESISTANT Resistant     TETRACYCLINE <=1 SENSITIVE Sensitive     VANCOMYCIN <=0.5 SENSITIVE Sensitive     TRIMETH/SULFA >=320 RESISTANT Resistant     CLINDAMYCIN >=8 RESISTANT Resistant     RIFAMPIN <=0.5 SENSITIVE Sensitive     Inducible Clindamycin NEGATIVE Sensitive     * MODERATE METHICILLIN RESISTANT STAPHYLOCOCCUS AUREUS  Fungus Culture Result     Status: None   Collection Time: 02/17/22  6:05 PM  Result Value Ref Range Status   Result 1 Comment  Final    Comment: (NOTE) KOH/Calcofluor preparation:  no fungus observed. Performed At: [facility] [address] [place], [place] [id number] Jolene Schimke MD XB:[id number]   Fungus Culture Result     Status: None   Collection Time: 02/17/22  6:05 PM  Result Value Ref Range Status   Result 1 Comment  Final    Comment: (NOTE) KOH/Calcofluor preparation:  no fungus observed. Performed At: [facility] [address] [place], [place] [id number] Jolene Schimke MD [id number]   Aerobic Culture w Gram Stain (superficial specimen)     Status: None   Collection Time: 02/18/22  3:43 PM   Specimen: Head; Abscess  Result Value Ref Range Status   Specimen Description HEAD  Final   Special Requests Normal  Final   Gram Stain   Final    RARE WBC PRESENT,BOTH  PMN AND MONONUCLEAR FEW GRAM POSITIVE COCCI IN PAIRS IN SINGLES Performed at [facility], [address] [place], [place] [address]    Culture   Final    MODERATE METHICILLIN RESISTANT STAPHYLOCOCCUS AUREUS   Report Status 02/20/2022 FINAL  Final   Organism ID, Bacteria METHICILLIN RESISTANT STAPHYLOCOCCUS AUREUS  Final      Susceptibility   Methicillin resistant staphylococcus aureus - MIC*    CIPROFLOXACIN >=8 RESISTANT Resistant  ERYTHROMYCIN >=8 RESISTANT Resistant     GENTAMICIN <=0.5 SENSITIVE Sensitive     OXACILLIN >=4 RESISTANT Resistant     TETRACYCLINE <=1 SENSITIVE Sensitive     VANCOMYCIN 1 SENSITIVE Sensitive     TRIMETH/SULFA >=320 RESISTANT Resistant     CLINDAMYCIN >=8 RESISTANT Resistant     RIFAMPIN <=0.5 SENSITIVE Sensitive     Inducible Clindamycin NEGATIVE Sensitive     * MODERATE METHICILLIN RESISTANT STAPHYLOCOCCUS AUREUS      Radiology Studies: No results found.     LOS: 7 days   Haru Anspaugh [employer]rishnan  [facility] Pager on [email or web address]  02/22/2022, 12:11 PM

## 2022-02-22 NOTE — Progress Notes (Signed)
[facility]  Date of Admission:  02/14/2022   Total days of inpatient antibiotics 8  Principal Problem:   MRSA bacteremia Active Problems:   Polysubstance abuse (HCC)   Cellulitis   Infection of skin due to methicillin resistant Staphylococcus aureus (MRSA)   Tobacco dependence   Septic arthritis of knee, left (HCC)   Abscess of forehead          Assessment: 46 year old male with history of methamphetamine use with skin picking, recently incarcerated, prior history of right finger infection(7/23) status post I&D with cultures growing MRSA treated with vancomycin inpatient and discharged on doxycycline x1 week admitted with MRSA bacteremia.  #Disseminated MRSA bacteremia involving  left knee, forehead abscess, Left eyelid #Methamphetamine use -  Given his history of recent drug use, patient is not a candidate for home IV antibiotics. - TTE and tee (9/11) showed  no vegetation - 9/8 Bcx + MRSA, 9/10NG -  Left knee SP I&D with Dr. Everardo Pacific with Cx+ MRSA on 9/10. Per OR note the purulent material did not enter joint and felt more c/w cellulitis than septic knee.  - Forehead abscess status post I&D with plastic surgery, Cx+ MRSA on 9/11(bedside) - Following I&D of forehead abscess developed pustular appearing lesion with surrounding erythema on left eyelid, suspect that may have been due to forehead abscess being opened up and disseminating MRSA. Start mupirocin ointment for relief.  -Given the burden of MRSA infection/various locations I think a 6 weeks course would be warranted.  Recommendations - Transitioned vancomycin to daptomycin for ease of dosing -  Complete 6 weeks of IV antibiotics with daptomycin  from negative Cx/OR for septic knee(EOT 10/22) due to complicated MRSA bacteremia .  Patient is amenable at this point to inpatient antibiotics.  ID will see pt about once a week to ensure clinical progression in the setting of disseminated  infection.    Microbiology:   Antibiotics: Vancomycin 9/7-9/12 Daptomycin 9/12-  Cultures: Blood 9/8 2/2 MRSA 9/10 NG    SUBJECTIVE: Resting in bed. Reports eye feels better Interval: Afebrile overnight, wbc 13.3k.   Review of Systems: Review of Systems  All other systems reviewed and are negative.    Scheduled Meds:  celecoxib  100 mg Oral BID   chlorhexidine  60 mL Topical Once   Chlorhexidine Gluconate Cloth  6 each Topical Q0600   docusate sodium  100 mg Oral BID   enoxaparin (LOVENOX) injection  40 mg Subcutaneous Q24H   lidocaine-EPINEPHrine  20 mL Infiltration Once   mupirocin ointment   Topical BID   povidone-iodine  2 Application Topical Once   sodium chloride flush  3 mL Intravenous Q12H   triamcinolone cream   Topical TID   Continuous Infusions:  DAPTOmycin (CUBICIN) 600 mg in sodium chloride 0.9 % IVPB 600 mg (02/21/22 2240)   PRN Meds:.bisacodyl, hydrALAZINE, methocarbamol, ondansetron **OR** ondansetron (ZOFRAN) IV, oxyCODONE, polyethylene glycol No Known Allergies  OBJECTIVE: Vitals:   02/21/22 2221 02/22/22 0553 02/22/22 0907 02/22/22 1000  BP: (!) 139/90 135/87 122/84 120/88  Pulse: 78 69 68 67  Resp:  20 16 20   Temp: 98.3 F (36.8 C) 98.6 F (37 C) 98.9 F (37.2 C) (!) 97 F (36.1 C)  TempSrc: Oral Oral Oral Oral  SpO2:  99% 99% 100%  Weight:      Height:       Body mass index is 23.05 kg/m.  Physical Exam Constitutional:  General: He is not in acute distress.    Appearance: He is normal weight. He is not toxic-appearing.  HENT:     Head: Normocephalic and atraumatic.     Right Ear: External ear normal.     Left Ear: External ear normal.     Nose: No congestion or rhinorrhea.     Mouth/Throat:     Mouth: Mucous membranes are moist.     Pharynx: Oropharynx is clear.  Eyes:     Extraocular Movements: Extraocular movements intact.     Conjunctiva/sclera: Conjunctivae normal.     Pupils: Pupils are equal, round, and  reactive to light.  Cardiovascular:     Rate and Rhythm: Normal rate and regular rhythm.     Heart sounds: No murmur heard.    No friction rub. No gallop.  Pulmonary:     Effort: Pulmonary effort is normal.     Breath sounds: Normal breath sounds.  Abdominal:     General: Abdomen is flat. Bowel sounds are normal.     Palpations: Abdomen is soft.  Musculoskeletal:        General: No swelling.     Cervical back: Normal range of motion and neck supple.  Skin:    General: Skin is warm and dry.  Neurological:     General: No focal deficit present.     Mental Status: He is oriented to person, place, and time.  Psychiatric:        Mood and Affect: Mood normal.          Lab Results Lab Results  Component Value Date   WBC 13.3 (H) 02/22/2022   HGB 12.1 (L) 02/22/2022   HCT 37.2 (L) 02/22/2022   MCV 99.5 02/22/2022   PLT 490 (H) 02/22/2022    Lab Results  Component Value Date   CREATININE 0.74 02/22/2022   BUN 14 02/22/2022   NA 138 02/22/2022   K 3.8 02/22/2022   CL 104 02/22/2022   CO2 24 02/22/2022    Lab Results  Component Value Date   ALT 18 02/15/2022   AST 19 02/15/2022   ALKPHOS 64 02/15/2022   BILITOT 0.7 02/15/2022        Danelle Earthly, MD [facility] 02/22/2022, 1:23 PM

## 2022-02-23 NOTE — Progress Notes (Signed)
[facility] PROGRESS NOTE   Eric Woods [id number] DOB: 1975-07-04 DOA: 02/14/2022  PCP: Patient, No Pcp Per  Brief History/Interval Summary: 46 y.o. male with a history of methamphetamine use, incarceration, skin picking behavior, recurrent MRSA infections who presented with left knee pain and several areas of skin and soft tissue infections. Broad IV antibiotics were started, narrowed to vancomycin and the patient was admitted. Orthopedics consulted, performed bedside left knee I&D 9/8. Blood cultures have grown MRSA. TTE and then subsequent TEE without vegetation. Left knee washout in OR performed 9/10 noting deep abscess without joint involvement. Also underwent bedside I&D of left forehead abscess by plastics on 9/11. Continues on IV antibiotics, vancomycin switched to daptomycin on 9/12. Repeat blood cultures 9/10 are NGTD.  Consultants: Infectious disease.  Orthopedics.  Plastic surgery.  Phone discussion with ophthalmology.     Subjective/Interval History: Patient mentions that he feels well.  Left eye is about the same as yesterday.  No change in vision.  No eye pain.  No headaches.      Assessment/Plan:  Multifocal SSTI, MRSA bacteremia No vegetation on TEE. Continue IV antibiotics for 6 weeks per ID.  Vancomycin was switched over to daptomycin for ease of dosing.  Pharmacy managing.  CK levels being followed weekly.   Has had previous hospitalization for MRSA infection and patient is "unsure" whether he took oral doxycycline after discharge.  Continue oral analgesic only. Adequate control.  Repeat blood cultures 9/10 sent, NGTD HIV NR July 2023, RPR NR, HCV and HPV SAg negative/NR. WBC has improved.  Remains afebrile.   Left knee cellulitis and abscess Orthopedics, Dr. Griffin Basil, performed operative I&D 9/10. Cultures MRSA. Distal LLE swelling improving, negative venous U/S for DVT.     Left forehead cellulitis and abscess S/p bedside I&D 9/11 by plastics  with 5cc purulent discharge collected. Culture +MRSA.   Left preseptal cellulitis Was discussed with ophthalmology, Dr. Posey Pronto on 9/9. Developing more superficial eruption localized to left eyelid and nose on 9/14. Still no changes in vision, painful EOM, or proptosis.  Patient had noted more swelling of the eyelids so a CT of the orbits was performed on 9/15 which did not show any evidence for orbital involvement or any abscess.  Patient reassured.  Continue with ointment to the eyelid.   Contact dermatitis Possible heat rash superiorly to left knee. Pruritic without evidence of infection/fluid collection. Betadine irritation is possible though less likely as there are areas with betadine without irritation. Triamcinolone topically. Keep area dry.    Polysubstance abuse UDS +amphetamine, THC. Cessation counseling provided.  Precludes discharge with PICC line in place.   Tobacco use:  Cessation counseling provided   Hyponatremia Resolved   Hypokalemia Supplemented.   Urinary hesitancy UA negative. Symptoms currently resolved.   DVT Prophylaxis: Lovenox Code Status: Full code Family Communication: Discussed with patient Disposition Plan: We will stay in the hospital till completion of antibiotic therapy (end date: 03/31/22)  Status is: Inpatient Remains inpatient appropriate because: MRSA bacteremia      Medications: Scheduled:  celecoxib  100 mg Oral BID   chlorhexidine  60 mL Topical Once   docusate sodium  100 mg Oral BID   enoxaparin (LOVENOX) injection  40 mg Subcutaneous Q24H   lidocaine-EPINEPHrine  20 mL Infiltration Once   mupirocin ointment   Topical BID   povidone-iodine  2 Application Topical Once   sodium chloride flush  3 mL Intravenous Q12H   triamcinolone cream   Topical TID   Continuous:  DAPTOmycin (CUBICIN) 600 mg in sodium chloride 0.9 % IVPB 600 mg (02/22/22 2052)   [id number], hydrALAZINE, methocarbamol, ondansetron **OR** ondansetron (ZOFRAN)  IV, oxyCODONE, polyethylene glycol  Antibiotics: Anti-infectives (From admission, onward)    Start     Dose/Rate Route Frequency Ordered Stop   02/19/22 2000  DAPTOmycin (CUBICIN) 600 mg in sodium chloride 0.9 % IVPB        8 mg/kg  77.1 kg 124 mL/hr over 30 Minutes Intravenous Daily 02/19/22 0931 04/01/22 2359   02/19/22 0600  ceFAZolin (ANCEF) IVPB 2g/100 mL premix        2 g 200 mL/hr over 30 Minutes Intravenous On call to O.R. 02/18/22 1146 02/19/22 0551   02/16/22 2200  vancomycin (VANCOREADY) IVPB 1500 mg/300 mL        1,500 mg 150 mL/hr over 120 Minutes Intravenous Every 12 hours 02/16/22 1123 02/19/22 1200   02/15/22 1730  vancomycin (VANCOCIN) IVPB 1000 mg/200 mL premix  Status:  Discontinued        1,000 mg 200 mL/hr over 60 Minutes Intravenous Every 12 hours 02/15/22 0658 02/15/22 0658   02/15/22 1730  vancomycin (VANCOCIN) IVPB 1000 mg/200 mL premix  Status:  Discontinued        1,000 mg 200 mL/hr over 60 Minutes Intravenous Every 12 hours 02/15/22 0658 02/16/22 1123   02/15/22 0445  vancomycin (VANCOREADY) IVPB 1500 mg/300 mL        1,500 mg 150 mL/hr over 120 Minutes Intravenous  Once 02/15/22 0442 02/15/22 0836       Objective:  Vital Signs  Vitals:   02/22/22 1602 02/22/22 2005 02/23/22 0359 02/23/22 0923  BP: 121/79 (!) 142/86 (!) 133/98 128/79  Pulse: 82 86 76 85  Resp: 17 17 18 18   Temp: 98.4 F (36.9 C) 98 F (36.7 C) 98.3 F (36.8 C) 97.9 F (36.6 C)  TempSrc: Oral   Oral  SpO2: 99% 99% 99% 99%  Weight:      Height:       No intake or output data in the 24 hours ending 02/23/22 1000  Filed Weights   02/15/22 0454 02/17/22 1715  Weight: 77.1 kg 77.1 kg    General appearance: Awake alert.  In no distress Swelling of the left eyelid is stable.  Normal extraocular movements. Rash over the left forehead is stable. Resp: Clear to auscultation bilaterally.  Normal effort Cardio: S1-S2 is normal regular.  No S3-S4.  No rubs murmurs or bruit GI:  Abdomen is soft.  Nontender nondistended.  Bowel sounds are present normal.  No masses organomegaly Extremities: No edema.  Full range of motion of lower extremities. Neurologic: Alert and oriented x3.  No focal neurological deficits.     Lab Results:  Data Reviewed: I have personally reviewed following labs and reports of the imaging studies  CBC: Recent Labs  Lab 02/17/22 0630 02/20/22 0132 02/22/22 0053  WBC 13.3* 12.2* 13.3*  NEUTROABS 9.8*  --   --   HGB 11.7* 11.4* 12.1*  HCT 33.5* 33.1* 37.2*  MCV 95.4 97.1 99.5  PLT 277 370 490*     Basic Metabolic Panel: Recent Labs  Lab 02/19/22 0143 02/20/22 0132 02/22/22 0053  NA 140 139 138  K 3.3* 3.2* 3.8  CL 102 104 104  CO2 24 27 24   GLUCOSE 119* 120* 97  BUN 8 10 14   CREATININE 0.77 0.81 0.74  CALCIUM 8.4* 8.3* 8.7*     GFR: Estimated Creatinine Clearance: 127.2 mL/min (by C-G formula  based on SCr of 0.74 mg/dL).  Cardiac Enzymes: Recent Labs  Lab 02/19/22 0143  CKTOTAL 55      Recent Results (from the past 240 hour(s))  Blood Culture (routine x 2)     Status: Abnormal   Collection Time: 02/15/22  4:58 AM   Specimen: BLOOD LEFT ARM  Result Value Ref Range Status   Specimen Description BLOOD LEFT ARM  Final   Special Requests   Final    BOTTLES DRAWN AEROBIC AND ANAEROBIC Blood Culture results may not be optimal due to an excessive volume of blood received in culture bottles   Culture  Setup Time   Final    GRAM POSITIVE COCCI IN CLUSTERS IN BOTH AEROBIC AND ANAEROBIC BOTTLES CRITICAL RESULT CALLED TO, READ BACK BY AND VERIFIED WITH: PHARMD FRANK ABBOTT ON 02/15/22 @ [id number] BY DRT Performed at [facility], [place] [address] [place], [place] [address]    Culture METHICILLIN RESISTANT STAPHYLOCOCCUS AUREUS (A)  Final   Report Status 02/17/2022 FINAL  Final   Organism ID, Bacteria METHICILLIN RESISTANT STAPHYLOCOCCUS AUREUS  Final      Susceptibility   Methicillin resistant staphylococcus aureus -  MIC*    CIPROFLOXACIN >=8 RESISTANT Resistant     ERYTHROMYCIN >=8 RESISTANT Resistant     GENTAMICIN <=0.5 SENSITIVE Sensitive     OXACILLIN >=4 RESISTANT Resistant     TETRACYCLINE <=1 SENSITIVE Sensitive     VANCOMYCIN 1 SENSITIVE Sensitive     TRIMETH/SULFA >=320 RESISTANT Resistant     CLINDAMYCIN >=8 RESISTANT Resistant     RIFAMPIN <=0.5 SENSITIVE Sensitive     Inducible Clindamycin NEGATIVE Sensitive     * METHICILLIN RESISTANT STAPHYLOCOCCUS AUREUS  Blood Culture (routine x 2)     Status: Abnormal   Collection Time: 02/15/22  4:58 AM   Specimen: BLOOD RIGHT FOREARM  Result Value Ref Range Status   Specimen Description BLOOD RIGHT FOREARM  Final   Special Requests   Final    BOTTLES DRAWN AEROBIC AND ANAEROBIC Blood Culture results may not be optimal due to an excessive volume of blood received in culture bottles   Culture  Setup Time   Final    ANAEROBIC BOTTLE ONLY GRAM POSITIVE COCCI IN CLUSTERS CRITICAL VALUE NOTED.  VALUE IS CONSISTENT WITH PREVIOUSLY REPORTED AND CALLED VALUE.    Culture (A)  Final    STAPHYLOCOCCUS AUREUS SUSCEPTIBILITIES PERFORMED ON PREVIOUS CULTURE WITHIN THE LAST 5 DAYS. Performed at [facility], [place] [address], [place], [place] [address]    Report Status 02/17/2022 FINAL  Final  Blood Culture ID Panel (Reflexed)     Status: Abnormal   Collection Time: 02/15/22  4:58 AM  Result Value Ref Range Status   Enterococcus faecalis NOT DETECTED NOT DETECTED Final   Enterococcus Faecium NOT DETECTED NOT DETECTED Final   Listeria monocytogenes NOT DETECTED NOT DETECTED Final   Staphylococcus species DETECTED (A) NOT DETECTED Final    Comment: CRITICAL RESULT CALLED TO, READ BACK BY AND VERIFIED WITH: PHARMD FRANK ABBOTT ON 02/15/22 @ 2314 BY DRT    Staphylococcus aureus (BCID) DETECTED (A) NOT DETECTED Final    Comment: Methicillin (oxacillin)-resistant Staphylococcus aureus (MRSA). MRSA is predictably resistant to beta-lactam antibiotics  (except ceftaroline). Preferred therapy is vancomycin unless clinically contraindicated. Patient requires contact precautions if  hospitalized. CRITICAL RESULT CALLED TO, READ BACK BY AND VERIFIED WITH: PHARMD FRANK ABBOTT ON 02/15/22 @ [id number] BY DRT    Staphylococcus epidermidis NOT DETECTED NOT DETECTED  Final   Staphylococcus lugdunensis NOT DETECTED NOT DETECTED Final   Streptococcus species NOT DETECTED NOT DETECTED Final   Streptococcus agalactiae NOT DETECTED NOT DETECTED Final   Streptococcus pneumoniae NOT DETECTED NOT DETECTED Final   Streptococcus pyogenes NOT DETECTED NOT DETECTED Final   A.calcoaceticus-baumannii NOT DETECTED NOT DETECTED Final   Bacteroides fragilis NOT DETECTED NOT DETECTED Final   Enterobacterales NOT DETECTED NOT DETECTED Final   Enterobacter cloacae complex NOT DETECTED NOT DETECTED Final   Escherichia coli NOT DETECTED NOT DETECTED Final   Klebsiella aerogenes NOT DETECTED NOT DETECTED Final   Klebsiella oxytoca NOT DETECTED NOT DETECTED Final   Klebsiella pneumoniae NOT DETECTED NOT DETECTED Final   Proteus species NOT DETECTED NOT DETECTED Final   Salmonella species NOT DETECTED NOT DETECTED Final   Serratia marcescens NOT DETECTED NOT DETECTED Final   Haemophilus influenzae NOT DETECTED NOT DETECTED Final   Neisseria meningitidis NOT DETECTED NOT DETECTED Final   Pseudomonas aeruginosa NOT DETECTED NOT DETECTED Final   Stenotrophomonas maltophilia NOT DETECTED NOT DETECTED Final   Candida albicans NOT DETECTED NOT DETECTED Final   Candida auris NOT DETECTED NOT DETECTED Final   Candida glabrata NOT DETECTED NOT DETECTED Final   Candida krusei NOT DETECTED NOT DETECTED Final   Candida parapsilosis NOT DETECTED NOT DETECTED Final   Candida tropicalis NOT DETECTED NOT DETECTED Final   Cryptococcus neoformans/gattii NOT DETECTED NOT DETECTED Final   Meth resistant mecA/C and MREJ DETECTED (A) NOT DETECTED Final    Comment: CRITICAL RESULT CALLED TO,  READ BACK BY AND VERIFIED WITH: PHARMD FRANK ABBOTT ON 02/15/22 @ [id number] BY DRT Performed at [facility], [address] [place], [place] [address]   Aerobic Culture w Gram Stain (superficial specimen)     Status: None   Collection Time: 02/15/22 10:20 AM   Specimen: KNEE  Result Value Ref Range Status   Specimen Description KNEE  Final   Special Requests NONE  Final   Gram Stain   Final    NO WBC SEEN RARE GRAM POSITIVE COCCI Performed at [facility], [place] [address] [place], [place] [address]    Culture   Final    MODERATE METHICILLIN RESISTANT STAPHYLOCOCCUS AUREUS   Report Status 02/17/2022 FINAL  Final   Organism ID, Bacteria METHICILLIN RESISTANT STAPHYLOCOCCUS AUREUS  Final      Susceptibility   Methicillin resistant staphylococcus aureus - MIC*    CIPROFLOXACIN >=8 RESISTANT Resistant     ERYTHROMYCIN >=8 RESISTANT Resistant     GENTAMICIN <=0.5 SENSITIVE Sensitive     OXACILLIN >=4 RESISTANT Resistant     TETRACYCLINE <=1 SENSITIVE Sensitive     VANCOMYCIN 1 SENSITIVE Sensitive     TRIMETH/SULFA >=320 RESISTANT Resistant     CLINDAMYCIN >=8 RESISTANT Resistant     RIFAMPIN <=0.5 SENSITIVE Sensitive     Inducible Clindamycin NEGATIVE Sensitive     * MODERATE METHICILLIN RESISTANT STAPHYLOCOCCUS AUREUS  Culture, blood (Routine X 2) w Reflex to ID Panel     Status: None   Collection Time: 02/17/22  6:29 AM   Specimen: BLOOD  Result Value Ref Range Status   Specimen Description BLOOD RIGHT ANTECUBITAL  Final   Special Requests   Final    BOTTLES DRAWN AEROBIC AND ANAEROBIC Blood Culture adequate volume   Culture   Final    NO GROWTH 5 DAYS Performed at [facility], [address] [place], [place] [address]    Report Status 02/22/2022  FINAL  Final  Culture, blood (Routine X 2) w Reflex to ID Panel     Status: None   Collection Time: 02/17/22  6:30 AM   Specimen: BLOOD  Result Value Ref Range Status   Specimen Description BLOOD LEFT ANTECUBITAL   Final   Special Requests   Final    BOTTLES DRAWN AEROBIC AND ANAEROBIC Blood Culture adequate volume   Culture   Final    NO GROWTH 5 DAYS Performed at [facility], [place] [address] [place], [place] [address]    Report Status 02/22/2022 FINAL  Final  Surgical pcr screen     Status: Abnormal   Collection Time: 02/17/22 11:30 AM   Specimen: Nasal Mucosa; Nasal Swab  Result Value Ref Range Status   MRSA, PCR POSITIVE (A) NEGATIVE Final   Staphylococcus aureus POSITIVE (A) NEGATIVE Final    Comment: RESULT CALLED TO, READ BACK BY AND VERIFIED WITH: 02/17/2022 @ 1605 RN ANGELITO, ADC (NOTE) The Xpert SA Assay (FDA approved for NASAL specimens in patients 84 years of age and older), is one component of a comprehensive surveillance program. It is not intended to diagnose infection nor to guide or monitor treatment. Performed at [facility], [place] [address], [place], [place] [address]   Fungus Culture With Stain     Status: None (Preliminary result)   Collection Time: 02/17/22  6:05 PM   Specimen: Abscess  Result Value Ref Range Status   Fungus Stain Final report  Final    Comment: (NOTE) Performed At: [facility] [place], [place] [id number] Rush Farmer MD [id number]    Fungus (Mycology) Culture PENDING  Incomplete   Fungal Source ABSCESS  Final    Comment: LEFT KNEE Performed at [facility], [place] [address] [place], [place] [address]   Fungus Culture With Stain     Status: None (Preliminary result)   Collection Time: 02/17/22  6:05 PM   Specimen: Abscess  Result Value Ref Range Status   Fungus Stain Final report  Final    Comment: (NOTE) Performed At: [facility] [place], [place] [id number] Rush Farmer MD [id number]    Fungus (Mycology) Culture PENDING  Incomplete   Fungal Source ABSCESS  Final    Comment: LEFT KNEE Performed at [facility], [place] [address] [place], [place]  [address]   Aerobic/Anaerobic Culture w Gram Stain (surgical/deep wound)     Status: None   Collection Time: 02/17/22  6:05 PM   Specimen: Abscess  Result Value Ref Range Status   Specimen Description ABSCESS LEFT KNEE  Final   Special Requests SPECIMEN A  Final   Gram Stain   Final    MODERATE WBC PRESENT,BOTH PMN AND MONONUCLEAR FEW GRAM POSITIVE COCCI IN CLUSTERS RARE GRAM POSITIVE COCCI IN PAIRS    Culture   Final    ABUNDANT METHICILLIN RESISTANT STAPHYLOCOCCUS AUREUS NO ANAEROBES ISOLATED Performed at [facility], [place] [address], [place],  [address]    Report Status 02/22/2022 FINAL  Final   Organism ID, Bacteria METHICILLIN RESISTANT STAPHYLOCOCCUS AUREUS  Final      Susceptibility   Methicillin resistant staphylococcus aureus - MIC*    CIPROFLOXACIN >=8 RESISTANT Resistant     ERYTHROMYCIN >=8 RESISTANT Resistant     GENTAMICIN <=0.5 SENSITIVE Sensitive     OXACILLIN >=4 RESISTANT Resistant     TETRACYCLINE <=1 SENSITIVE Sensitive     VANCOMYCIN <=0.5 SENSITIVE Sensitive  TRIMETH/SULFA >=320 RESISTANT Resistant     CLINDAMYCIN >=8 RESISTANT Resistant     RIFAMPIN <=0.5 SENSITIVE Sensitive     Inducible Clindamycin NEGATIVE Sensitive     * ABUNDANT METHICILLIN RESISTANT STAPHYLOCOCCUS AUREUS  Aerobic/Anaerobic Culture w Gram Stain (surgical/deep wound)     Status: None   Collection Time: 02/17/22  6:05 PM   Specimen: Abscess  Result Value Ref Range Status   Specimen Description ABSCESS LEFT KNEE  Final   Special Requests SPECIMEN B  Final   Gram Stain   Final    FEW WBC PRESENT,BOTH PMN AND MONONUCLEAR RARE GRAM POSITIVE COCCI IN PAIRS    Culture   Final    MODERATE METHICILLIN RESISTANT STAPHYLOCOCCUS AUREUS NO ANAEROBES ISOLATED Performed at [facility], [place] [address] [place], [place] [address]    Report Status 02/22/2022 FINAL  Final   Organism ID, Bacteria METHICILLIN RESISTANT STAPHYLOCOCCUS AUREUS  Final      Susceptibility    Methicillin resistant staphylococcus aureus - MIC*    CIPROFLOXACIN >=8 RESISTANT Resistant     ERYTHROMYCIN >=8 RESISTANT Resistant     GENTAMICIN <=0.5 SENSITIVE Sensitive     OXACILLIN >=4 RESISTANT Resistant     TETRACYCLINE <=1 SENSITIVE Sensitive     VANCOMYCIN <=0.5 SENSITIVE Sensitive     TRIMETH/SULFA >=320 RESISTANT Resistant     CLINDAMYCIN >=8 RESISTANT Resistant     RIFAMPIN <=0.5 SENSITIVE Sensitive     Inducible Clindamycin NEGATIVE Sensitive     * MODERATE METHICILLIN RESISTANT STAPHYLOCOCCUS AUREUS  Fungus Culture Result     Status: None   Collection Time: 02/17/22  6:05 PM  Result Value Ref Range Status   Result 1 Comment  Final    Comment: (NOTE) KOH/Calcofluor preparation:  no fungus observed. Performed At: [facility] [place], [place] [id number] Rush Farmer MD [id number]   Fungus Culture Result     Status: None   Collection Time: 02/17/22  6:05 PM  Result Value Ref Range Status   Result 1 Comment  Final    Comment: (NOTE) KOH/Calcofluor preparation:  no fungus observed. Performed At: [facility] [place], [place] [id number] Rush Farmer MD [id number]   Aerobic Culture w Gram Stain (superficial specimen)     Status: None   Collection Time: 02/18/22  3:43 PM   Specimen: Head; Abscess  Result Value Ref Range Status   Specimen Description HEAD  Final   Special Requests Normal  Final   Gram Stain   Final    RARE WBC PRESENT,BOTH PMN AND MONONUCLEAR FEW GRAM POSITIVE COCCI IN PAIRS IN SINGLES Performed at [facility], [address] [place], [place] [address]    Culture   Final    MODERATE METHICILLIN RESISTANT STAPHYLOCOCCUS AUREUS   Report Status 02/20/2022 FINAL  Final   Organism ID, Bacteria METHICILLIN RESISTANT STAPHYLOCOCCUS AUREUS  Final      Susceptibility   Methicillin resistant staphylococcus aureus - MIC*    CIPROFLOXACIN >=8 RESISTANT Resistant     ERYTHROMYCIN >=8 RESISTANT  Resistant     GENTAMICIN <=0.5 SENSITIVE Sensitive     OXACILLIN >=4 RESISTANT Resistant     TETRACYCLINE <=1 SENSITIVE Sensitive     VANCOMYCIN 1 SENSITIVE Sensitive     TRIMETH/SULFA >=320 RESISTANT Resistant     CLINDAMYCIN >=8 RESISTANT Resistant     RIFAMPIN <=0.5 SENSITIVE Sensitive     Inducible Clindamycin NEGATIVE Sensitive     * MODERATE METHICILLIN RESISTANT  STAPHYLOCOCCUS AUREUS      Radiology Studies: CT ORBITS W CONTRAST  Result Date: 02/22/2022 CLINICAL DATA:  Orbital cellulitis suspected.  Left eye swelling. EXAM: CT ORBITS WITH CONTRAST TECHNIQUE: Multidetector CT images was performed according to the standard protocol following intravenous contrast administration. RADIATION DOSE REDUCTION: This exam was performed according to the departmental dose-optimization program which includes automated exposure control, adjustment of the mA and/or kV according to patient size and/or use of iterative reconstruction technique. CONTRAST:  5mL OMNIPAQUE IOHEXOL 350 MG/ML SOLN COMPARISON:  Head CT 06/18/2013 FINDINGS: Orbits: Mild left orbital preseptal soft tissue thickening. No evidence of postseptal inflammation or abscess. Intact globes. No orbital mass. Visible paranasal sinuses: Clear. Soft tissues: Moderate soft tissue thickening involving the left greater than right forehead, incompletely imaged. No evidence of a forehead abscess where imaged. Osseous: No fracture or destructive osseous process. Limited intracranial: Unremarkable. IMPRESSION: Left periorbital and partially visualized forehead soft tissue thickening compatible with cellulitis. No evidence of postseptal cellulitis or abscess. Electronically Signed   By: Logan Bores M.D.   On: 02/22/2022 16:07       LOS: 8 days   [facility] Pager on [email or web address]  02/23/2022, 10:00 AM

## 2022-02-23 NOTE — Plan of Care (Signed)
  Problem: Clinical Measurements: Goal: Ability to avoid or minimize complications of infection will improve Outcome: Progressing   Problem: Skin Integrity: Goal: Skin integrity will improve Outcome: Progressing   Problem: Education: Goal: Knowledge of General Education information will improve Description: Including pain rating scale, medication(s)/side effects and non-pharmacologic comfort measures Outcome: Progressing   Problem: Health Behavior/Discharge Planning: Goal: Ability to manage health-related needs will improve Outcome: Progressing   Problem: Clinical Measurements: Goal: Ability to maintain clinical measurements within normal limits will improve Outcome: Progressing Goal: Will remain free from infection Outcome: Progressing Goal: Diagnostic test results will improve Outcome: Progressing Goal: Respiratory complications will improve Outcome: Progressing Goal: Cardiovascular complication will be avoided Outcome: Progressing   Problem: Clinical Measurements: Goal: Will remain free from infection Outcome: Progressing   Problem: Activity: Goal: Risk for activity intolerance will decrease Outcome: Progressing   Problem: Nutrition: Goal: Adequate nutrition will be maintained Outcome: Progressing   Problem: Clinical Measurements: Goal: Ability to maintain clinical measurements within normal limits will improve Outcome: Progressing Goal: Will remain free from infection Outcome: Progressing Goal: Diagnostic test results will improve Outcome: Progressing Goal: Respiratory complications will improve Outcome: Progressing Goal: Cardiovascular complication will be avoided Outcome: Progressing

## 2022-02-24 LAB — CBC
HCT: 36.8 % — ABNORMAL LOW (ref 39.0–52.0)
Hemoglobin: 12.3 g/dL — ABNORMAL LOW (ref 13.0–17.0)
MCH: 32.5 pg (ref 26.0–34.0)
MCHC: 33.4 g/dL (ref 30.0–36.0)
MCV: 97.1 fL (ref 80.0–100.0)
Platelets: 597 10*3/uL — ABNORMAL HIGH (ref 150–400)
RBC: 3.79 MIL/uL — ABNORMAL LOW (ref 4.22–5.81)
RDW: 12.5 % (ref 11.5–15.5)
WBC: 11.9 10*3/uL — ABNORMAL HIGH (ref 4.0–10.5)
nRBC: 0 % (ref 0.0–0.2)

## 2022-02-24 LAB — COMPREHENSIVE METABOLIC PANEL
ALT: 36 U/L (ref 0–44)
AST: 26 U/L (ref 15–41)
Albumin: 2.7 g/dL — ABNORMAL LOW (ref 3.5–5.0)
Alkaline Phosphatase: 67 U/L (ref 38–126)
Anion gap: 11 (ref 5–15)
BUN: 16 mg/dL (ref 6–20)
CO2: 26 mmol/L (ref 22–32)
Calcium: 8.7 mg/dL — ABNORMAL LOW (ref 8.9–10.3)
Chloride: 103 mmol/L (ref 98–111)
Creatinine, Ser: 0.84 mg/dL (ref 0.61–1.24)
GFR, Estimated: >60 mL/min (ref >60)
Glucose, Bld: 100 mg/dL — ABNORMAL HIGH (ref 70–99)
Potassium: 3.8 mmol/L (ref 3.5–5.1)
Sodium: 140 mmol/L (ref 135–145)
Total Bilirubin: <0.1 mg/dL — ABNORMAL LOW (ref 0.3–1.2)
Total Protein: 6.3 g/dL — ABNORMAL LOW (ref 6.5–8.1)

## 2022-02-24 LAB — MAGNESIUM: Magnesium: 2.3 mg/dL (ref 1.7–2.4)

## 2022-02-24 NOTE — Progress Notes (Signed)
Pt educated about going off the floor according to pt the MD told him on 1st shift he could go outside he just couldn't leave the property. Not the report received from dayshift nurse which also educated pt. Pt currently outside with vistor.

## 2022-02-24 NOTE — Progress Notes (Signed)
[facility] PROGRESS NOTE   Eric RangeJames Selph ZOX:[id number] DOB: 07/18/1975 DOA: 02/14/2022  PCP: Patient, No Pcp Per  Brief History/Interval Summary: 46 y.o. male with a history of methamphetamine use, incarceration, skin picking behavior, recurrent MRSA infections who presented with left knee pain and several areas of skin and soft tissue infections. Broad IV antibiotics were started, narrowed to vancomycin and the patient was admitted. Orthopedics consulted, performed bedside left knee I&D 9/8. Blood cultures have grown MRSA. TTE and then subsequent TEE without vegetation. Left knee washout in OR performed 9/10 noting deep abscess without joint involvement. Also underwent bedside I&D of left forehead abscess by plastics on 9/11. Continues on IV antibiotics, vancomycin switched to daptomycin on 9/12. Repeat blood cultures 9/10 are NGTD.  Consultants: Infectious disease.  Orthopedics.  Plastic surgery.  Phone discussion with ophthalmology.     Subjective/Interval History: Patient mentioned that he feels well. Left Eye is stable.  No other complaints offered.  His mother is at the bedside.       Assessment/Plan:  Multifocal SSTI, MRSA bacteremia No vegetation on TEE. Continue IV antibiotics for 6 weeks per ID.  Vancomycin was switched over to daptomycin for ease of dosing.  Pharmacy managing.  CK levels being followed weekly.   Has had previous hospitalization for MRSA infection and patient is "unsure" whether he took oral doxycycline after discharge.  Repeat blood cultures 9/10 sent, NGTD HIV NR July 2023, RPR NR, HCV and HPV SAg negative/NR. WBC continues to improve.  He remains afebrile.     Left knee cellulitis and abscess Orthopedics, Dr. Everardo PacificVarkey, performed operative I&D 9/10. Cultures MRSA. Distal LLE swelling improving, negative venous U/S for DVT.     Left forehead cellulitis and abscess S/p bedside I&D 9/11 by plastics with 5cc purulent discharge collected. Culture  +MRSA.   Left preseptal cellulitis Was discussed with ophthalmology, Dr. Allena KatzPatel on 9/9. Developing more superficial eruption localized to left eyelid and nose on 9/14. Still no changes in vision, painful EOM, or proptosis.  Patient had noted more swelling of the eyelids so a CT of the orbits was performed on 9/15 which did not show any evidence for orbital involvement or any abscess.  Patient reassured.  Continue with ointment to the eyelid. No change in vision.   Contact dermatitis Possible heat rash superiorly to left knee. Pruritic without evidence of infection/fluid collection. Betadine irritation is possible though less likely as there are areas with betadine without irritation. Triamcinolone topically.  Rash has not improved significantly.  Normocytic anemia Hemoglobin stable.  No evidence of overt blood loss.   Polysubstance abuse UDS +amphetamine, THC. Cessation counseling provided.  Precludes discharge with PICC line in place.   Tobacco use:  Cessation counseling provided   Hyponatremia Resolved   Hypokalemia Supplemented.   Urinary hesitancy UA negative. Symptoms currently resolved.   DVT Prophylaxis: Lovenox Code Status: Full code Family Communication: Discussed with patient Disposition Plan: Will stay in the hospital till completion of antibiotic therapy (end date: 03/31/22)  Status is: Inpatient Remains inpatient appropriate because: MRSA bacteremia      Medications: Scheduled:  celecoxib  100 mg Oral BID   chlorhexidine  60 mL Topical Once   docusate sodium  100 mg Oral BID   enoxaparin (LOVENOX) injection  40 mg Subcutaneous Q24H   lidocaine-EPINEPHrine  20 mL Infiltration Once   mupirocin ointment   Topical BID   povidone-iodine  2 Application Topical Once   sodium chloride flush  3 mL Intravenous Q12H  triamcinolone cream   Topical TID   Continuous:  DAPTOmycin (CUBICIN) 600 mg in sodium chloride 0.9 % IVPB 600 mg (02/23/22 2108)    [id number], hydrALAZINE, methocarbamol, ondansetron **OR** ondansetron (ZOFRAN) IV, oxyCODONE, polyethylene glycol  Antibiotics: Anti-infectives (From admission, onward)    Start     Dose/Rate Route Frequency Ordered Stop   02/19/22 2000  DAPTOmycin (CUBICIN) 600 mg in sodium chloride 0.9 % IVPB        8 mg/kg  77.1 kg 124 mL/hr over 30 Minutes Intravenous Daily 02/19/22 0931 04/01/22 2359   02/19/22 0600  ceFAZolin (ANCEF) IVPB 2g/100 mL premix        2 g 200 mL/hr over 30 Minutes Intravenous On call to O.R. 02/18/22 1146 02/19/22 0551   02/16/22 2200  vancomycin (VANCOREADY) IVPB 1500 mg/300 mL        1,500 mg 150 mL/hr over 120 Minutes Intravenous Every 12 hours 02/16/22 1123 02/19/22 1200   02/15/22 1730  vancomycin (VANCOCIN) IVPB 1000 mg/200 mL premix  Status:  Discontinued        1,000 mg 200 mL/hr over 60 Minutes Intravenous Every 12 hours 02/15/22 0658 02/15/22 0658   02/15/22 1730  vancomycin (VANCOCIN) IVPB 1000 mg/200 mL premix  Status:  Discontinued        1,000 mg 200 mL/hr over 60 Minutes Intravenous Every 12 hours 02/15/22 0658 02/16/22 1123   02/15/22 0445  vancomycin (VANCOREADY) IVPB 1500 mg/300 mL        1,500 mg 150 mL/hr over 120 Minutes Intravenous  Once 02/15/22 0442 02/15/22 0836       Objective:  Vital Signs  Vitals:   02/23/22 0359 02/23/22 0923 02/23/22 2107 02/24/22 0619  BP: (!) 133/98 128/79 130/79 131/89  Pulse: 76 85 86 78  Resp: 18 18 16 18   Temp: 98.3 F (36.8 C) 97.9 F (36.6 C) 98.3 F (36.8 C) 98.5 F (36.9 C)  TempSrc:  Oral Axillary Oral  SpO2: 99% 99% 98% 100%  Weight:      Height:       No intake or output data in the 24 hours ending 02/24/22 0958  Filed Weights   02/15/22 0454 02/17/22 1715  Weight: 77.1 kg 77.1 kg    General appearance: Awake alert.  In no distress Left eyelid swelling is stable Resp: Clear to auscultation bilaterally.  Normal effort Cardio: S1-S2 is normal regular.  No S3-S4.  No rubs  murmurs or bruit GI: Abdomen is soft.  Nontender nondistended.  Bowel sounds are present normal.  No masses organomegaly Extremities: No edema.  Full Woods of motion of lower extremities. Neurologic: Alert and oriented x3.  No focal neurological deficits.      Lab Results:  Data Reviewed: I have personally reviewed following labs and reports of the imaging studies  CBC: Recent Labs  Lab 02/20/22 0132 02/22/22 0053 02/24/22 0031  WBC 12.2* 13.3* 11.9*  HGB 11.4* 12.1* 12.3*  HCT 33.1* 37.2* 36.8*  MCV 97.1 99.5 97.1  PLT 370 490* 597*     Basic Metabolic Panel: Recent Labs  Lab 02/19/22 0143 02/20/22 0132 02/22/22 0053 02/24/22 0031  NA 140 139 138 140  K 3.3* 3.2* 3.8 3.8  CL 102 104 104 103  CO2 24 27 24 26   GLUCOSE 119* 120* 97 100*  BUN 8 10 14 16   CREATININE 0.77 0.81 0.74 0.84  CALCIUM 8.4* 8.3* 8.7* 8.7*     GFR: Estimated Creatinine Clearance: 121.1 mL/min (by C-G formula based on  SCr of 0.84 mg/dL).  Cardiac Enzymes: Recent Labs  Lab 02/19/22 0143  CKTOTAL 55      Recent Results (from the past 240 hour(s))  Blood Culture (routine x 2)     Status: Abnormal   Collection Time: 02/15/22  4:58 AM   Specimen: BLOOD LEFT ARM  Result Value Ref Woods Status   Specimen Description BLOOD LEFT ARM  Final   Special Requests   Final    BOTTLES DRAWN AEROBIC AND ANAEROBIC Blood Culture results may not be optimal due to an excessive volume of blood received in culture bottles   Culture  Setup Time   Final    GRAM POSITIVE COCCI IN CLUSTERS IN BOTH AEROBIC AND ANAEROBIC BOTTLES CRITICAL RESULT CALLED TO, READ BACK BY AND VERIFIED WITH: PHARMD FRANK ABBOTT ON 02/15/22 @ [id number] BY DRT Performed at [facility], [place] [address] [place], [place] [address]    Culture METHICILLIN RESISTANT STAPHYLOCOCCUS AUREUS (A)  Final   Report Status 02/17/2022 FINAL  Final   Organism ID, Bacteria METHICILLIN RESISTANT STAPHYLOCOCCUS AUREUS  Final      Susceptibility    Methicillin resistant staphylococcus aureus - MIC*    CIPROFLOXACIN >=8 RESISTANT Resistant     ERYTHROMYCIN >=8 RESISTANT Resistant     GENTAMICIN <=0.5 SENSITIVE Sensitive     OXACILLIN >=4 RESISTANT Resistant     TETRACYCLINE <=1 SENSITIVE Sensitive     VANCOMYCIN 1 SENSITIVE Sensitive     TRIMETH/SULFA >=320 RESISTANT Resistant     CLINDAMYCIN >=8 RESISTANT Resistant     RIFAMPIN <=0.5 SENSITIVE Sensitive     Inducible Clindamycin NEGATIVE Sensitive     * METHICILLIN RESISTANT STAPHYLOCOCCUS AUREUS  Blood Culture (routine x 2)     Status: Abnormal   Collection Time: 02/15/22  4:58 AM   Specimen: BLOOD RIGHT FOREARM  Result Value Ref Woods Status   Specimen Description BLOOD RIGHT FOREARM  Final   Special Requests   Final    BOTTLES DRAWN AEROBIC AND ANAEROBIC Blood Culture results may not be optimal due to an excessive volume of blood received in culture bottles   Culture  Setup Time   Final    ANAEROBIC BOTTLE ONLY GRAM POSITIVE COCCI IN CLUSTERS CRITICAL VALUE NOTED.  VALUE IS CONSISTENT WITH PREVIOUSLY REPORTED AND CALLED VALUE.    Culture (A)  Final    STAPHYLOCOCCUS AUREUS SUSCEPTIBILITIES PERFORMED ON PREVIOUS CULTURE WITHIN THE LAST 5 DAYS. Performed at [facility], [place] [address], [place], [place] [address]    Report Status 02/17/2022 FINAL  Final  Blood Culture ID Panel (Reflexed)     Status: Abnormal   Collection Time: 02/15/22  4:58 AM  Result Value Ref Woods Status   Enterococcus faecalis NOT DETECTED NOT DETECTED Final   Enterococcus Faecium NOT DETECTED NOT DETECTED Final   Listeria monocytogenes NOT DETECTED NOT DETECTED Final   Staphylococcus species DETECTED (A) NOT DETECTED Final    Comment: CRITICAL RESULT CALLED TO, READ BACK BY AND VERIFIED WITH: PHARMD FRANK ABBOTT ON 02/15/22 @ 2314 BY DRT    Staphylococcus aureus (BCID) DETECTED (A) NOT DETECTED Final    Comment: Methicillin (oxacillin)-resistant Staphylococcus aureus (MRSA). MRSA is  predictably resistant to beta-lactam antibiotics (except ceftaroline). Preferred therapy is vancomycin unless clinically contraindicated. Patient requires contact precautions if  hospitalized. CRITICAL RESULT CALLED TO, READ BACK BY AND VERIFIED WITH: PHARMD FRANK ABBOTT ON 02/15/22 @ [id number] BY DRT    Staphylococcus epidermidis NOT DETECTED NOT DETECTED Final  Staphylococcus lugdunensis NOT DETECTED NOT DETECTED Final   Streptococcus species NOT DETECTED NOT DETECTED Final   Streptococcus agalactiae NOT DETECTED NOT DETECTED Final   Streptococcus pneumoniae NOT DETECTED NOT DETECTED Final   Streptococcus pyogenes NOT DETECTED NOT DETECTED Final   A.calcoaceticus-baumannii NOT DETECTED NOT DETECTED Final   Bacteroides fragilis NOT DETECTED NOT DETECTED Final   Enterobacterales NOT DETECTED NOT DETECTED Final   Enterobacter cloacae complex NOT DETECTED NOT DETECTED Final   Escherichia coli NOT DETECTED NOT DETECTED Final   Klebsiella aerogenes NOT DETECTED NOT DETECTED Final   Klebsiella oxytoca NOT DETECTED NOT DETECTED Final   Klebsiella pneumoniae NOT DETECTED NOT DETECTED Final   Proteus species NOT DETECTED NOT DETECTED Final   Salmonella species NOT DETECTED NOT DETECTED Final   Serratia marcescens NOT DETECTED NOT DETECTED Final   Haemophilus influenzae NOT DETECTED NOT DETECTED Final   Neisseria meningitidis NOT DETECTED NOT DETECTED Final   Pseudomonas aeruginosa NOT DETECTED NOT DETECTED Final   Stenotrophomonas maltophilia NOT DETECTED NOT DETECTED Final   Candida albicans NOT DETECTED NOT DETECTED Final   Candida auris NOT DETECTED NOT DETECTED Final   Candida glabrata NOT DETECTED NOT DETECTED Final   Candida krusei NOT DETECTED NOT DETECTED Final   Candida parapsilosis NOT DETECTED NOT DETECTED Final   Candida tropicalis NOT DETECTED NOT DETECTED Final   Cryptococcus neoformans/gattii NOT DETECTED NOT DETECTED Final   Meth resistant mecA/C and MREJ DETECTED (A) NOT DETECTED  Final    Comment: CRITICAL RESULT CALLED TO, READ BACK BY AND VERIFIED WITH: PHARMD FRANK ABBOTT ON 02/15/22 @ 6144 BY DRT Performed at [facility], [address] [place], [place] [address]   Aerobic Culture w Gram Stain (superficial specimen)     Status: None   Collection Time: 02/15/22 10:20 AM   Specimen: KNEE  Result Value Ref Woods Status   Specimen Description KNEE  Final   Special Requests NONE  Final   Gram Stain   Final    NO WBC SEEN RARE GRAM POSITIVE COCCI Performed at [facility], [place] [address], [place], [place] [address]    Culture   Final    MODERATE METHICILLIN RESISTANT STAPHYLOCOCCUS AUREUS   Report Status 02/17/2022 FINAL  Final   Organism ID, Bacteria METHICILLIN RESISTANT STAPHYLOCOCCUS AUREUS  Final      Susceptibility   Methicillin resistant staphylococcus aureus - MIC*    CIPROFLOXACIN >=8 RESISTANT Resistant     ERYTHROMYCIN >=8 RESISTANT Resistant     GENTAMICIN <=0.5 SENSITIVE Sensitive     OXACILLIN >=4 RESISTANT Resistant     TETRACYCLINE <=1 SENSITIVE Sensitive     VANCOMYCIN 1 SENSITIVE Sensitive     TRIMETH/SULFA >=320 RESISTANT Resistant     CLINDAMYCIN >=8 RESISTANT Resistant     RIFAMPIN <=0.5 SENSITIVE Sensitive     Inducible Clindamycin NEGATIVE Sensitive     * MODERATE METHICILLIN RESISTANT STAPHYLOCOCCUS AUREUS  Culture, blood (Routine X 2) w Reflex to ID Panel     Status: None   Collection Time: 02/17/22  6:29 AM   Specimen: BLOOD  Result Value Ref Woods Status   Specimen Description BLOOD RIGHT ANTECUBITAL  Final   Special Requests   Final    BOTTLES DRAWN AEROBIC AND ANAEROBIC Blood Culture adequate volume   Culture   Final    NO GROWTH 5 DAYS Performed at [facility], [address], [place], [place] [address]    Report Status 02/22/2022 FINAL  Final  Culture, blood (Routine X 2) w Reflex to ID Panel     Status: None   Collection Time: 02/17/22  6:30 AM   Specimen: BLOOD  Result Value Ref Woods Status    Specimen Description BLOOD LEFT ANTECUBITAL  Final   Special Requests   Final    BOTTLES DRAWN AEROBIC AND ANAEROBIC Blood Culture adequate volume   Culture   Final    NO GROWTH 5 DAYS Performed at [facility], [address] [place], [place] [address]    Report Status 02/22/2022 FINAL  Final  Surgical pcr screen     Status: Abnormal   Collection Time: 02/17/22 11:30 AM   Specimen: Nasal Mucosa; Nasal Swab  Result Value Ref Woods Status   MRSA, PCR POSITIVE (A) NEGATIVE Final   Staphylococcus aureus POSITIVE (A) NEGATIVE Final    Comment: RESULT CALLED TO, READ BACK BY AND VERIFIED WITH: 02/17/2022 @ 1605 RN ANGELITO, ADC (NOTE) The Xpert SA Assay (FDA approved for NASAL specimens in patients 64 years of age and older), is one component of a comprehensive surveillance program. It is not intended to diagnose infection nor to guide or monitor treatment. Performed at [facility], [place] [address], [place], [place] [address]   Fungus Culture With Stain     Status: None (Preliminary result)   Collection Time: 02/17/22  6:05 PM   Specimen: Abscess  Result Value Ref Woods Status   Fungus Stain Final report  Final    Comment: (NOTE) Performed At: [facility] [place], [place] [id number] Rush Farmer MD [id number]    Fungus (Mycology) Culture PENDING  Incomplete   Fungal Source ABSCESS  Final    Comment: LEFT KNEE Performed at [facility], [place] [address], [place], [place] [address]   Fungus Culture With Stain     Status: None (Preliminary result)   Collection Time: 02/17/22  6:05 PM   Specimen: Abscess  Result Value Ref Woods Status   Fungus Stain Final report  Final    Comment: (NOTE) Performed At: [facility] [place], [place] [id number] Rush Farmer MD [id number]    Fungus (Mycology) Culture PENDING  Incomplete   Fungal Source ABSCESS  Final    Comment: LEFT KNEE Performed at [facility], [place] [address], [place], [place] [address]   Aerobic/Anaerobic Culture w Gram Stain (surgical/deep wound)     Status: None   Collection Time: 02/17/22  6:05 PM   Specimen: Abscess  Result Value Ref Woods Status   Specimen Description ABSCESS LEFT KNEE  Final   Special Requests SPECIMEN A  Final   Gram Stain   Final    MODERATE WBC PRESENT,BOTH PMN AND MONONUCLEAR FEW GRAM POSITIVE COCCI IN CLUSTERS RARE GRAM POSITIVE COCCI IN PAIRS    Culture   Final    ABUNDANT METHICILLIN RESISTANT STAPHYLOCOCCUS AUREUS NO ANAEROBES ISOLATED Performed at [facility], [place] [address], [place],  [address]    Report Status 02/22/2022 FINAL  Final   Organism ID, Bacteria METHICILLIN RESISTANT STAPHYLOCOCCUS AUREUS  Final      Susceptibility   Methicillin resistant staphylococcus aureus - MIC*    CIPROFLOXACIN >=8 RESISTANT Resistant     ERYTHROMYCIN >=8 RESISTANT Resistant     GENTAMICIN <=0.5 SENSITIVE Sensitive     OXACILLIN >=4 RESISTANT Resistant     TETRACYCLINE <=1 SENSITIVE Sensitive     VANCOMYCIN <=0.5 SENSITIVE Sensitive  TRIMETH/SULFA >=320 RESISTANT Resistant     CLINDAMYCIN >=8 RESISTANT Resistant     RIFAMPIN <=0.5 SENSITIVE Sensitive     Inducible Clindamycin NEGATIVE Sensitive     * ABUNDANT METHICILLIN RESISTANT STAPHYLOCOCCUS AUREUS  Aerobic/Anaerobic Culture w Gram Stain (surgical/deep wound)     Status: None   Collection Time: 02/17/22  6:05 PM   Specimen: Abscess  Result Value Ref Woods Status   Specimen Description ABSCESS LEFT KNEE  Final   Special Requests SPECIMEN B  Final   Gram Stain   Final    FEW WBC PRESENT,BOTH PMN AND MONONUCLEAR RARE GRAM POSITIVE COCCI IN PAIRS    Culture   Final    MODERATE METHICILLIN RESISTANT STAPHYLOCOCCUS AUREUS NO ANAEROBES ISOLATED Performed at [facility], [address], [place], [place] [address]    Report Status 02/22/2022 FINAL  Final   Organism ID, Bacteria METHICILLIN RESISTANT  STAPHYLOCOCCUS AUREUS  Final      Susceptibility   Methicillin resistant staphylococcus aureus - MIC*    CIPROFLOXACIN >=8 RESISTANT Resistant     ERYTHROMYCIN >=8 RESISTANT Resistant     GENTAMICIN <=0.5 SENSITIVE Sensitive     OXACILLIN >=4 RESISTANT Resistant     TETRACYCLINE <=1 SENSITIVE Sensitive     VANCOMYCIN <=0.5 SENSITIVE Sensitive     TRIMETH/SULFA >=320 RESISTANT Resistant     CLINDAMYCIN >=8 RESISTANT Resistant     RIFAMPIN <=0.5 SENSITIVE Sensitive     Inducible Clindamycin NEGATIVE Sensitive     * MODERATE METHICILLIN RESISTANT STAPHYLOCOCCUS AUREUS  Fungus Culture Result     Status: None   Collection Time: 02/17/22  6:05 PM  Result Value Ref Woods Status   Result 1 Comment  Final    Comment: (NOTE) KOH/Calcofluor preparation:  no fungus observed. Performed At: [facility] [address] [place], [place] [id number] Jolene Schimke MD XB:[id number]   Fungus Culture Result     Status: None   Collection Time: 02/17/22  6:05 PM  Result Value Ref Woods Status   Result 1 Comment  Final    Comment: (NOTE) KOH/Calcofluor preparation:  no fungus observed. Performed At: [facility] [address] [place], [place] [id number] Jolene Schimke MD [id number]   Aerobic Culture w Gram Stain (superficial specimen)     Status: None   Collection Time: 02/18/22  3:43 PM   Specimen: Head; Abscess  Result Value Ref Woods Status   Specimen Description HEAD  Final   Special Requests Normal  Final   Gram Stain   Final    RARE WBC PRESENT,BOTH PMN AND MONONUCLEAR FEW GRAM POSITIVE COCCI IN PAIRS IN SINGLES Performed at [facility], [address], [place], [place] [address]    Culture   Final    MODERATE METHICILLIN RESISTANT STAPHYLOCOCCUS AUREUS   Report Status 02/20/2022 FINAL  Final   Organism ID, Bacteria METHICILLIN RESISTANT STAPHYLOCOCCUS AUREUS  Final      Susceptibility   Methicillin resistant staphylococcus aureus - MIC*    CIPROFLOXACIN  >=8 RESISTANT Resistant     ERYTHROMYCIN >=8 RESISTANT Resistant     GENTAMICIN <=0.5 SENSITIVE Sensitive     OXACILLIN >=4 RESISTANT Resistant     TETRACYCLINE <=1 SENSITIVE Sensitive     VANCOMYCIN 1 SENSITIVE Sensitive     TRIMETH/SULFA >=320 RESISTANT Resistant     CLINDAMYCIN >=8 RESISTANT Resistant     RIFAMPIN <=0.5 SENSITIVE Sensitive     Inducible Clindamycin NEGATIVE Sensitive     * MODERATE METHICILLIN RESISTANT  STAPHYLOCOCCUS AUREUS      Radiology Studies: CT ORBITS W CONTRAST  Result Date: 02/22/2022 CLINICAL DATA:  Orbital cellulitis suspected.  Left eye swelling. EXAM: CT ORBITS WITH CONTRAST TECHNIQUE: Multidetector CT images was performed according to the standard protocol following intravenous contrast administration. RADIATION DOSE REDUCTION: This exam was performed according to the departmental dose-optimization program which includes automated exposure control, adjustment of the mA and/or kV according to patient size and/or use of iterative reconstruction technique. CONTRAST:  33mL OMNIPAQUE IOHEXOL 350 MG/ML SOLN COMPARISON:  Head CT 06/18/2013 FINDINGS: Orbits: Mild left orbital preseptal soft tissue thickening. No evidence of postseptal inflammation or abscess. Intact globes. No orbital mass. Visible paranasal sinuses: Clear. Soft tissues: Moderate soft tissue thickening involving the left greater than right forehead, incompletely imaged. No evidence of a forehead abscess where imaged. Osseous: No fracture or destructive osseous process. Limited intracranial: Unremarkable. IMPRESSION: Left periorbital and partially visualized forehead soft tissue thickening compatible with cellulitis. No evidence of postseptal cellulitis or abscess. Electronically Signed   By: Logan Bores M.D.   On: 02/22/2022 16:07       LOS: 9 days   Curtis Cain [employer] on [email or web address]  02/24/2022, 9:58 AM

## 2022-02-25 NOTE — Progress Notes (Signed)
[facility] PROGRESS NOTE   Shon Kelty [id number] DOB: 27-Mar-1976 DOA: 02/14/2022  PCP: Patient, No Pcp Per  Brief History/Interval Summary: 46 y.o. male with a history of methamphetamine use, incarceration, skin picking behavior, recurrent MRSA infections who presented with left knee pain and several areas of skin and soft tissue infections. Broad IV antibiotics were started, narrowed to vancomycin and the patient was admitted. Orthopedics consulted, performed bedside left knee I&D 9/8. Blood cultures have grown MRSA. TTE and then subsequent TEE without vegetation. Left knee washout in OR performed 9/10 noting deep abscess without joint involvement. Also underwent bedside I&D of left forehead abscess by plastics on 9/11. Continues on IV antibiotics, vancomycin switched to daptomycin on 9/12. Repeat blood cultures 9/10 are NGTD.  Consultants: Infectious disease.  Orthopedics.  Plastic surgery.  Phone discussion with ophthalmology.     Subjective/Interval History: Denies any complaints this morning.  Feels that the left eye is improving.  Patient was told that he cannot go off of the unit.    Assessment/Plan:  Multifocal SSTI, MRSA bacteremia No vegetation on TEE. Continue IV antibiotics for 6 weeks per ID.  Vancomycin was switched over to daptomycin for ease of dosing.  Pharmacy managing.  CK levels being followed weekly.   Has had previous hospitalization for MRSA infection and patient is "unsure" whether he took oral doxycycline after discharge.  Repeat blood cultures 9/10 sent, NGTD HIV NR July 2023, RPR NR, HCV and HPV SAg negative/NR. Stable.  WBC continues to improve.  He remains afebrile.   Left knee cellulitis and abscess Orthopedics, Dr. Griffin Basil, performed operative I&D 9/10. Cultures MRSA. Distal LLE swelling improving, negative venous U/S for DVT.     Left forehead cellulitis and abscess S/p bedside I&D 9/11 by plastics with 5cc purulent discharge  collected. Culture +MRSA.   Left preseptal cellulitis Was discussed with ophthalmology, Dr. Posey Pronto on 9/9. Developing more superficial eruption localized to left eyelid and nose on 9/14. Still no changes in vision, painful EOM, or proptosis.  Patient had noted more swelling of the eyelids so a CT of the orbits was performed on 9/15 which did not show any evidence for orbital involvement or any abscess.  Patient reassured.  Continue with ointment to the eyelid. Eyelid swelling appears to be improving.   Contact dermatitis Possible heat rash superiorly to left knee. Pruritic without evidence of infection/fluid collection. Betadine irritation is possible though less likely as there are areas with betadine without irritation. Triamcinolone topically.  Rash has improved significantly.  Normocytic anemia Hemoglobin stable.  No evidence of overt blood loss.   Polysubstance abuse UDS +amphetamine, THC. Cessation counseling provided.  Precludes discharge with PICC line in place.   Tobacco use:  Cessation counseling provided   Hyponatremia Resolved   Hypokalemia Supplemented.   Urinary hesitancy UA negative. Symptoms currently resolved.   DVT Prophylaxis: Lovenox Code Status: Full code Family Communication: Discussed with patient Disposition Plan: Will stay in the hospital till completion of antibiotic therapy (end date: 03/31/22)  Status is: Inpatient Remains inpatient appropriate because: MRSA bacteremia      Medications: Scheduled:  celecoxib  100 mg Oral BID   chlorhexidine  60 mL Topical Once   docusate sodium  100 mg Oral BID   enoxaparin (LOVENOX) injection  40 mg Subcutaneous Q24H   lidocaine-EPINEPHrine  20 mL Infiltration Once   mupirocin ointment   Topical BID   povidone-iodine  2 Application Topical Once   sodium chloride flush  3 mL Intravenous Q12H  triamcinolone cream   Topical TID   Continuous:  DAPTOmycin (CUBICIN) 600 mg in sodium chloride 0.9 % IVPB 600  mg (02/24/22 2221)   [id number], hydrALAZINE, methocarbamol, ondansetron **OR** ondansetron (ZOFRAN) IV, oxyCODONE, polyethylene glycol  Antibiotics: Anti-infectives (From admission, onward)    Start     Dose/Rate Route Frequency Ordered Stop   02/19/22 2000  DAPTOmycin (CUBICIN) 600 mg in sodium chloride 0.9 % IVPB        8 mg/kg  77.1 kg 124 mL/hr over 30 Minutes Intravenous Daily 02/19/22 0931 04/01/22 2359   02/19/22 0600  ceFAZolin (ANCEF) IVPB 2g/100 mL premix        2 g 200 mL/hr over 30 Minutes Intravenous On call to O.R. 02/18/22 1146 02/19/22 0551   02/16/22 2200  vancomycin (VANCOREADY) IVPB 1500 mg/300 mL        1,500 mg 150 mL/hr over 120 Minutes Intravenous Every 12 hours 02/16/22 1123 02/19/22 1200   02/15/22 1730  vancomycin (VANCOCIN) IVPB 1000 mg/200 mL premix  Status:  Discontinued        1,000 mg 200 mL/hr over 60 Minutes Intravenous Every 12 hours 02/15/22 0658 02/15/22 0658   02/15/22 1730  vancomycin (VANCOCIN) IVPB 1000 mg/200 mL premix  Status:  Discontinued        1,000 mg 200 mL/hr over 60 Minutes Intravenous Every 12 hours 02/15/22 0658 02/16/22 1123   02/15/22 0445  vancomycin (VANCOREADY) IVPB 1500 mg/300 mL        1,500 mg 150 mL/hr over 120 Minutes Intravenous  Once 02/15/22 0442 02/15/22 0836       Objective:  Vital Signs  Vitals:   02/24/22 1720 02/24/22 2249 02/25/22 0505 02/25/22 0825  BP: (!) 131/90 125/84 139/86 125/88  Pulse: (!) 104 94 71 78  Resp: 18 16 17 17   Temp: 98 F (36.7 C) 99.2 F (37.3 C) 98.5 F (36.9 C) 98.2 F (36.8 C)  TempSrc: Oral Oral Oral   SpO2: 100% 100% 99% 100%  Weight:      Height:        Intake/Output Summary (Last 24 hours) at 02/25/2022 1008 Last data filed at 02/25/2022 0827 Gross per 24 hour  Intake 3 ml  Output --  Net 3 ml    Filed Weights   02/15/22 0454 02/17/22 1715  Weight: 77.1 kg 77.1 kg    General appearance: Awake alert.  In no distress Left eyelid swelling appears to be  slightly better today compared to the last few days. Resp: Clear to auscultation bilaterally.  Normal effort Cardio: S1-S2 is normal regular.  No S3-S4.  No rubs murmurs or bruit GI: Abdomen is soft.  Nontender nondistended.  Bowel sounds are present normal.  No masses organomegaly Extremities: No edema.  Full range of motion of lower extremities. Neurologic: Alert and oriented x3.  No focal neurological deficits.      Lab Results:  Data Reviewed: I have personally reviewed following labs and reports of the imaging studies  CBC: Recent Labs  Lab 02/20/22 0132 02/22/22 0053 02/24/22 0031  WBC 12.2* 13.3* 11.9*  HGB 11.4* 12.1* 12.3*  HCT 33.1* 37.2* 36.8*  MCV 97.1 99.5 97.1  PLT 370 490* 597*     Basic Metabolic Panel: Recent Labs  Lab 02/19/22 0143 02/20/22 0132 02/22/22 0053 02/24/22 0027 02/24/22 0031  NA 140 139 138  --  140  K 3.3* 3.2* 3.8  --  3.8  CL 102 104 104  --  103  CO2 24  27 24  --  26  GLUCOSE 119* 120* 97  --  100*  BUN 8 10 14   --  16  CREATININE 0.77 0.81 0.74  --  0.84  CALCIUM 8.4* 8.3* 8.7*  --  8.7*  MG  --   --   --  2.3  --      GFR: Estimated Creatinine Clearance: 121.1 mL/min (by C-G formula based on SCr of 0.84 mg/dL).  Cardiac Enzymes: Recent Labs  Lab 02/19/22 0143  CKTOTAL 55      Recent Results (from the past 240 hour(s))  Aerobic Culture w Gram Stain (superficial specimen)     Status: None   Collection Time: 02/15/22 10:20 AM   Specimen: KNEE  Result Value Ref Range Status   Specimen Description KNEE  Final   Special Requests NONE  Final   Gram Stain   Final    NO WBC SEEN RARE GRAM POSITIVE COCCI Performed at [facility], [place] [address], [place], [place] [address]    Culture   Final    MODERATE METHICILLIN RESISTANT STAPHYLOCOCCUS AUREUS   Report Status 02/17/2022 FINAL  Final   Organism ID, Bacteria METHICILLIN RESISTANT STAPHYLOCOCCUS AUREUS  Final      Susceptibility   Methicillin resistant  staphylococcus aureus - MIC*    CIPROFLOXACIN >=8 RESISTANT Resistant     ERYTHROMYCIN >=8 RESISTANT Resistant     GENTAMICIN <=0.5 SENSITIVE Sensitive     OXACILLIN >=4 RESISTANT Resistant     TETRACYCLINE <=1 SENSITIVE Sensitive     VANCOMYCIN 1 SENSITIVE Sensitive     TRIMETH/SULFA >=320 RESISTANT Resistant     CLINDAMYCIN >=8 RESISTANT Resistant     RIFAMPIN <=0.5 SENSITIVE Sensitive     Inducible Clindamycin NEGATIVE Sensitive     * MODERATE METHICILLIN RESISTANT STAPHYLOCOCCUS AUREUS  Culture, blood (Routine X 2) w Reflex to ID Panel     Status: None   Collection Time: 02/17/22  6:29 AM   Specimen: BLOOD  Result Value Ref Range Status   Specimen Description BLOOD RIGHT ANTECUBITAL  Final   Special Requests   Final    BOTTLES DRAWN AEROBIC AND ANAEROBIC Blood Culture adequate volume   Culture   Final    NO GROWTH 5 DAYS Performed at [facility], [address], [place], [place] [address]    Report Status 02/22/2022 FINAL  Final  Culture, blood (Routine X 2) w Reflex to ID Panel     Status: None   Collection Time: 02/17/22  6:30 AM   Specimen: BLOOD  Result Value Ref Range Status   Specimen Description BLOOD LEFT ANTECUBITAL  Final   Special Requests   Final    BOTTLES DRAWN AEROBIC AND ANAEROBIC Blood Culture adequate volume   Culture   Final    NO GROWTH 5 DAYS Performed at [facility], [place] [address], [place], [place] [address]    Report Status 02/22/2022 FINAL  Final  Surgical pcr screen     Status: Abnormal   Collection Time: 02/17/22 11:30 AM   Specimen: Nasal Mucosa; Nasal Swab  Result Value Ref Range Status   MRSA, PCR POSITIVE (A) NEGATIVE Final   Staphylococcus aureus POSITIVE (A) NEGATIVE Final    Comment: RESULT CALLED TO, READ BACK BY AND VERIFIED WITH: 02/17/2022 @ 1605 RN ANGELITO, ADC (NOTE) The Xpert SA Assay (FDA approved for NASAL specimens in patients 76 years of age and older), is one component of a comprehensive surveillance  program. It is not intended to diagnose infection nor to guide or monitor treatment. Performed at [facility], [place] [address], [place], [place] [address]   Fungus Culture With Stain     Status: None (Preliminary result)   Collection Time: 02/17/22  6:05 PM   Specimen: Abscess  Result Value Ref Range Status   Fungus Stain Final report  Final    Comment: (NOTE) Performed At: [facility] [place], [place] [id number] Rush Farmer MD [id number]    Fungus (Mycology) Culture PENDING  Incomplete   Fungal Source ABSCESS  Final    Comment: LEFT KNEE Performed at [facility], [place] [address], [place], [place] [address]   Fungus Culture With Stain     Status: None (Preliminary result)   Collection Time: 02/17/22  6:05 PM   Specimen: Abscess  Result Value Ref Range Status   Fungus Stain Final report  Final    Comment: (NOTE) Performed At: [facility] [place], [place] [id number] Rush Farmer MD [id number]    Fungus (Mycology) Culture PENDING  Incomplete   Fungal Source ABSCESS  Final    Comment: LEFT KNEE Performed at [facility], [place] [address] [place], [place] [address]   Aerobic/Anaerobic Culture w Gram Stain (surgical/deep wound)     Status: None   Collection Time: 02/17/22  6:05 PM   Specimen: Abscess  Result Value Ref Range Status   Specimen Description ABSCESS LEFT KNEE  Final   Special Requests SPECIMEN A  Final   Gram Stain   Final    MODERATE WBC PRESENT,BOTH PMN AND MONONUCLEAR FEW GRAM POSITIVE COCCI IN CLUSTERS RARE GRAM POSITIVE COCCI IN PAIRS    Culture   Final    ABUNDANT METHICILLIN RESISTANT STAPHYLOCOCCUS AUREUS NO ANAEROBES ISOLATED Performed at [facility], [place] [address] [place], [place] [address]    Report Status 02/22/2022 FINAL  Final   Organism ID, Bacteria METHICILLIN RESISTANT STAPHYLOCOCCUS AUREUS  Final      Susceptibility   Methicillin resistant staphylococcus  aureus - MIC*    CIPROFLOXACIN >=8 RESISTANT Resistant     ERYTHROMYCIN >=8 RESISTANT Resistant     GENTAMICIN <=0.5 SENSITIVE Sensitive     OXACILLIN >=4 RESISTANT Resistant     TETRACYCLINE <=1 SENSITIVE Sensitive     VANCOMYCIN <=0.5 SENSITIVE Sensitive     TRIMETH/SULFA >=320 RESISTANT Resistant     CLINDAMYCIN >=8 RESISTANT Resistant     RIFAMPIN <=0.5 SENSITIVE Sensitive     Inducible Clindamycin NEGATIVE Sensitive     * ABUNDANT METHICILLIN RESISTANT STAPHYLOCOCCUS AUREUS  Aerobic/Anaerobic Culture w Gram Stain (surgical/deep wound)     Status: None   Collection Time: 02/17/22  6:05 PM   Specimen: Abscess  Result Value Ref Range Status   Specimen Description ABSCESS LEFT KNEE  Final   Special Requests SPECIMEN B  Final   Gram Stain   Final    FEW WBC PRESENT,BOTH PMN AND MONONUCLEAR RARE GRAM POSITIVE COCCI IN PAIRS    Culture   Final    MODERATE METHICILLIN RESISTANT STAPHYLOCOCCUS AUREUS NO ANAEROBES ISOLATED Performed at [facility], [address], [place], [place] [address]    Report Status 02/22/2022 FINAL  Final   Organism ID, Bacteria METHICILLIN RESISTANT STAPHYLOCOCCUS AUREUS  Final      Susceptibility   Methicillin resistant staphylococcus aureus - MIC*    CIPROFLOXACIN >=8 RESISTANT Resistant     ERYTHROMYCIN >=8 RESISTANT  Resistant     GENTAMICIN <=0.5 SENSITIVE Sensitive     OXACILLIN >=4 RESISTANT Resistant     TETRACYCLINE <=1 SENSITIVE Sensitive     VANCOMYCIN <=0.5 SENSITIVE Sensitive     TRIMETH/SULFA >=320 RESISTANT Resistant     CLINDAMYCIN >=8 RESISTANT Resistant     RIFAMPIN <=0.5 SENSITIVE Sensitive     Inducible Clindamycin NEGATIVE Sensitive     * MODERATE METHICILLIN RESISTANT STAPHYLOCOCCUS AUREUS  Fungus Culture Result     Status: None   Collection Time: 02/17/22  6:05 PM  Result Value Ref Range Status   Result 1 Comment  Final    Comment: (NOTE) KOH/Calcofluor preparation:  no fungus observed. Performed At: [facility] [place], [place] [id number] Rush Farmer MD [id number]   Fungus Culture Result     Status: None   Collection Time: 02/17/22  6:05 PM  Result Value Ref Range Status   Result 1 Comment  Final    Comment: (NOTE) KOH/Calcofluor preparation:  no fungus observed. Performed At: [facility] [place], [place] [id number] Rush Farmer MD [id number]   Aerobic Culture w Gram Stain (superficial specimen)     Status: None   Collection Time: 02/18/22  3:43 PM   Specimen: Head; Abscess  Result Value Ref Range Status   Specimen Description HEAD  Final   Special Requests Normal  Final   Gram Stain   Final    RARE WBC PRESENT,BOTH PMN AND MONONUCLEAR FEW GRAM POSITIVE COCCI IN PAIRS IN SINGLES Performed at [facility], [address] [place], [place] [address]    Culture   Final    MODERATE METHICILLIN RESISTANT STAPHYLOCOCCUS AUREUS   Report Status 02/20/2022 FINAL  Final   Organism ID, Bacteria METHICILLIN RESISTANT STAPHYLOCOCCUS AUREUS  Final      Susceptibility   Methicillin resistant staphylococcus aureus - MIC*    CIPROFLOXACIN >=8 RESISTANT Resistant     ERYTHROMYCIN >=8 RESISTANT Resistant     GENTAMICIN <=0.5 SENSITIVE Sensitive     OXACILLIN >=4 RESISTANT Resistant     TETRACYCLINE <=1 SENSITIVE Sensitive     VANCOMYCIN 1 SENSITIVE Sensitive     TRIMETH/SULFA >=320 RESISTANT Resistant     CLINDAMYCIN >=8 RESISTANT Resistant     RIFAMPIN <=0.5 SENSITIVE Sensitive     Inducible Clindamycin NEGATIVE Sensitive     * MODERATE METHICILLIN RESISTANT STAPHYLOCOCCUS AUREUS      Radiology Studies: No results found.     LOS: 10 days   Vipul Cafarelli [employer] on [email or web address]  02/25/2022, 10:08 AM

## 2022-02-25 NOTE — Plan of Care (Signed)
?  Problem: Clinical Measurements: ?Goal: Ability to avoid or minimize complications of infection will improve ?Outcome: Progressing ?  ?Problem: Skin Integrity: ?Goal: Skin integrity will improve ?Outcome: Progressing ?  ?

## 2022-02-26 LAB — CK: Total CK: 44 U/L — ABNORMAL LOW (ref 49–397)

## 2022-02-26 NOTE — Progress Notes (Signed)
[facility] PROGRESS NOTE   Eric Woods IEP:[id number] DOB: June 09, 1976 DOA: 02/14/2022  PCP: Patient, No Pcp Per  Brief History/Interval Summary: 46 y.o. male with a history of methamphetamine use, incarceration, skin picking behavior, recurrent MRSA infections who presented with left knee pain and several areas of skin and soft tissue infections. Broad IV antibiotics were started, narrowed to vancomycin and the patient was admitted. Orthopedics consulted, performed bedside left knee I&D 9/8. Blood cultures have grown MRSA. TTE and then subsequent TEE without vegetation. Left knee washout in OR performed 9/10 noting deep abscess without joint involvement. Also underwent bedside I&D of left forehead abscess by plastics on 9/11. Continues on IV antibiotics, vancomycin switched to daptomycin on 9/12. Repeat blood cultures 9/10 are NGTD.  Consultants: Infectious disease.  Orthopedics.  Plastic surgery.  Phone discussion with ophthalmology.     Subjective/Interval History: Denies any complaints this morning.  Left eye is improving.    Assessment/Plan:  Multifocal SSTI, MRSA bacteremia No vegetation on TEE. Continue IV antibiotics for 6 weeks per ID.  Vancomycin was switched over to daptomycin for ease of dosing.  Pharmacy managing.  CK levels being followed weekly.   Has had previous hospitalization for MRSA infection and patient is "unsure" whether he took oral doxycycline after discharge.  Repeat blood cultures 9/10 sent, NGTD HIV NR July 2023, RPR NR, HCV and HPV SAg negative/NR. Patient remained stable.  WBC is improving.  He remains afebrile.   Left knee cellulitis and abscess Orthopedics, Dr. Everardo Pacific, performed operative I&D 9/10. Cultures MRSA. Distal LLE swelling improving, negative venous U/S for DVT.   Wound care as per orthopedics.   Left forehead cellulitis and abscess S/p bedside I&D 9/11 by plastics with 5cc purulent discharge collected. Culture +MRSA.   Left  preseptal cellulitis Was discussed with ophthalmology, Dr. Allena Katz on 9/9. Developing more superficial eruption localized to left eyelid and nose on 9/14. Still no changes in vision, painful EOM, or proptosis.  Patient had noted more swelling of the eyelids so a CT of the orbits was performed on 9/15 which did not show any evidence for orbital involvement or any abscess.  Patient reassured.  Continue with ointment to the eyelid. Eyelid swelling appears to be improving.   Contact dermatitis Possible heat rash superiorly to left knee. Pruritic without evidence of infection/fluid collection. Betadine irritation is possible though less likely as there are areas with betadine without irritation. Triamcinolone topically.  Rash has improved significantly.  Normocytic anemia Hemoglobin stable.  No evidence of overt blood loss.   Polysubstance abuse UDS +amphetamine, THC. Cessation counseling provided.  Precludes discharge with PICC line in place.   Tobacco use:  Cessation counseling provided   Hyponatremia Resolved   Hypokalemia Supplemented.   Urinary hesitancy UA negative. Symptoms currently resolved.   DVT Prophylaxis: Lovenox Code Status: Full code Family Communication: Discussed with patient Disposition Plan: Will stay in the hospital till completion of antibiotic therapy (end date: 03/31/22)  Status is: Inpatient Remains inpatient appropriate because: MRSA bacteremia      Medications: Scheduled:  celecoxib  100 mg Oral BID   chlorhexidine  60 mL Topical Once   docusate sodium  100 mg Oral BID   enoxaparin (LOVENOX) injection  40 mg Subcutaneous Q24H   lidocaine-EPINEPHrine  20 mL Infiltration Once   mupirocin ointment   Topical BID   povidone-iodine  2 Application Topical Once   sodium chloride flush  3 mL Intravenous Q12H   triamcinolone cream   Topical TID  Continuous:  DAPTOmycin (CUBICIN) 600 mg in sodium chloride 0.9 % IVPB 600 mg (02/26/22 0100)    VEL:[id number], hydrALAZINE, methocarbamol, ondansetron **OR** ondansetron (ZOFRAN) IV, oxyCODONE, polyethylene glycol  Antibiotics: Anti-infectives (From admission, onward)    Start     Dose/Rate Route Frequency Ordered Stop   02/19/22 2000  DAPTOmycin (CUBICIN) 600 mg in sodium chloride 0.9 % IVPB        8 mg/kg  77.1 kg 124 mL/hr over 30 Minutes Intravenous Daily 02/19/22 0931 03/31/22 2359   02/19/22 0600  ceFAZolin (ANCEF) IVPB 2g/100 mL premix        2 g 200 mL/hr over 30 Minutes Intravenous On call to O.R. 02/18/22 1146 02/19/22 0551   02/16/22 2200  vancomycin (VANCOREADY) IVPB 1500 mg/300 mL        1,500 mg 150 mL/hr over 120 Minutes Intravenous Every 12 hours 02/16/22 1123 02/19/22 1200   02/15/22 1730  vancomycin (VANCOCIN) IVPB 1000 mg/200 mL premix  Status:  Discontinued        1,000 mg 200 mL/hr over 60 Minutes Intravenous Every 12 hours 02/15/22 0658 02/15/22 0658   02/15/22 1730  vancomycin (VANCOCIN) IVPB 1000 mg/200 mL premix  Status:  Discontinued        1,000 mg 200 mL/hr over 60 Minutes Intravenous Every 12 hours 02/15/22 0658 02/16/22 1123   02/15/22 0445  vancomycin (VANCOREADY) IVPB 1500 mg/300 mL        1,500 mg 150 mL/hr over 120 Minutes Intravenous  Once 02/15/22 0442 02/15/22 0836       Objective:  Vital Signs  Vitals:   02/25/22 1547 02/25/22 1925 02/26/22 0410 02/26/22 0849  BP: 125/88 122/83 115/82 (!) 136/106  Pulse: 85 88 77 89  Resp: 17 17 17 17   Temp: 98 F (36.7 C) 98.6 F (37 C) 98.2 F (36.8 C) 98.7 F (37.1 C)  TempSrc:    Oral  SpO2:  99% 100% 100%  Weight:      Height:        Intake/Output Summary (Last 24 hours) at 02/26/2022 1112 Last data filed at 02/26/2022 0835 Gross per 24 hour  Intake 483 ml  Output --  Net 483 ml    Filed Weights   02/15/22 0454 02/17/22 1715  Weight: 77.1 kg 77.1 kg    General appearance: Awake alert.  In no distress Resp: Clear to auscultation bilaterally.  Normal effort Cardio: S1-S2  is normal regular.  No S3-S4.  No rubs murmurs or bruit GI: Abdomen is soft.  Nontender nondistended.  Bowel sounds are present normal.  No masses organomegaly    Lab Results:  Data Reviewed: I have personally reviewed following labs and reports of the imaging studies  CBC: Recent Labs  Lab 02/20/22 0132 02/22/22 0053 02/24/22 0031  WBC 12.2* 13.3* 11.9*  HGB 11.4* 12.1* 12.3*  HCT 33.1* 37.2* 36.8*  MCV 97.1 99.5 97.1  PLT 370 490* 597*     Basic Metabolic Panel: Recent Labs  Lab 02/20/22 0132 02/22/22 0053 02/24/22 0027 02/24/22 0031  NA 139 138  --  140  K 3.2* 3.8  --  3.8  CL 104 104  --  103  CO2 27 24  --  26  GLUCOSE 120* 97  --  100*  BUN 10 14  --  16  CREATININE 0.81 0.74  --  0.84  CALCIUM 8.3* 8.7*  --  8.7*  MG  --   --  2.3  --  GFR: Estimated Creatinine Clearance: 121.1 mL/min (by C-G formula based on SCr of 0.84 mg/dL).  Cardiac Enzymes: Recent Labs  Lab 02/26/22 0729  CKTOTAL 44*      Recent Results (from the past 240 hour(s))  Culture, blood (Routine X 2) w Reflex to ID Panel     Status: None   Collection Time: 02/17/22  6:29 AM   Specimen: BLOOD  Result Value Ref Range Status   Specimen Description BLOOD RIGHT ANTECUBITAL  Final   Special Requests   Final    BOTTLES DRAWN AEROBIC AND ANAEROBIC Blood Culture adequate volume   Culture   Final    NO GROWTH 5 DAYS Performed at [facility], [address] [place], [place] [address]    Report Status 02/22/2022 FINAL  Final  Culture, blood (Routine X 2) w Reflex to ID Panel     Status: None   Collection Time: 02/17/22  6:30 AM   Specimen: BLOOD  Result Value Ref Range Status   Specimen Description BLOOD LEFT ANTECUBITAL  Final   Special Requests   Final    BOTTLES DRAWN AEROBIC AND ANAEROBIC Blood Culture adequate volume   Culture   Final    NO GROWTH 5 DAYS Performed at [facility], [address] [place], [place] [address]    Report Status 02/22/2022  FINAL  Final  Surgical pcr screen     Status: Abnormal   Collection Time: 02/17/22 11:30 AM   Specimen: Nasal Mucosa; Nasal Swab  Result Value Ref Range Status   MRSA, PCR POSITIVE (A) NEGATIVE Final   Staphylococcus aureus POSITIVE (A) NEGATIVE Final    Comment: RESULT CALLED TO, READ BACK BY AND VERIFIED WITH: 02/17/2022 @ 1605 RN ANGELITO, ADC (NOTE) The Xpert SA Assay (FDA approved for NASAL specimens in patients 46 years of age and older), is one component of a comprehensive surveillance program. It is not intended to diagnose infection nor to guide or monitor treatment. Performed at [facility], [address] [place], [place] [address]   Fungus Culture With Stain     Status: None (Preliminary result)   Collection Time: 02/17/22  6:05 PM   Specimen: Abscess  Result Value Ref Range Status   Fungus Stain Final report  Final    Comment: (NOTE) Performed At: [facility] [address] [place], [place] [id number] Jolene SchimkeNagendra Sanjai MD IO:[id number]:[phone]    Fungus (Mycology) Culture PENDING  Incomplete   Fungal Source ABSCESS  Final    Comment: LEFT KNEE Performed at [facility], [address] [place], [place] [address]   Fungus Culture With Stain     Status: None (Preliminary result)   Collection Time: 02/17/22  6:05 PM   Specimen: Abscess  Result Value Ref Range Status   Fungus Stain Final report  Final    Comment: (NOTE) Performed At: [facility] [address] [place], [place] [id number] Jolene SchimkeNagendra Sanjai MD QI:[id number]:[phone]    Fungus (Mycology) Culture PENDING  Incomplete   Fungal Source ABSCESS  Final    Comment: LEFT KNEE Performed at [facility], [address] [place], [place] [address]   Aerobic/Anaerobic Culture w Gram Stain (surgical/deep wound)     Status: None   Collection Time: 02/17/22  6:05 PM   Specimen: Abscess  Result Value Ref Range Status   Specimen Description ABSCESS LEFT KNEE  Final   Special Requests SPECIMEN  A  Final   Gram Stain  Final    MODERATE WBC PRESENT,BOTH PMN AND MONONUCLEAR FEW GRAM POSITIVE COCCI IN CLUSTERS RARE GRAM POSITIVE COCCI IN PAIRS    Culture   Final    ABUNDANT METHICILLIN RESISTANT STAPHYLOCOCCUS AUREUS NO ANAEROBES ISOLATED Performed at [facility], [address], [place], [place] [address]    Report Status 02/22/2022 FINAL  Final   Organism ID, Bacteria METHICILLIN RESISTANT STAPHYLOCOCCUS AUREUS  Final      Susceptibility   Methicillin resistant staphylococcus aureus - MIC*    CIPROFLOXACIN >=8 RESISTANT Resistant     ERYTHROMYCIN >=8 RESISTANT Resistant     GENTAMICIN <=0.5 SENSITIVE Sensitive     OXACILLIN >=4 RESISTANT Resistant     TETRACYCLINE <=1 SENSITIVE Sensitive     VANCOMYCIN <=0.5 SENSITIVE Sensitive     TRIMETH/SULFA >=320 RESISTANT Resistant     CLINDAMYCIN >=8 RESISTANT Resistant     RIFAMPIN <=0.5 SENSITIVE Sensitive     Inducible Clindamycin NEGATIVE Sensitive     * ABUNDANT METHICILLIN RESISTANT STAPHYLOCOCCUS AUREUS  Aerobic/Anaerobic Culture w Gram Stain (surgical/deep wound)     Status: None   Collection Time: 02/17/22  6:05 PM   Specimen: Abscess  Result Value Ref Range Status   Specimen Description ABSCESS LEFT KNEE  Final   Special Requests SPECIMEN B  Final   Gram Stain   Final    FEW WBC PRESENT,BOTH PMN AND MONONUCLEAR RARE GRAM POSITIVE COCCI IN PAIRS    Culture   Final    MODERATE METHICILLIN RESISTANT STAPHYLOCOCCUS AUREUS NO ANAEROBES ISOLATED Performed at [facility], [address], [place], [place] [address]    Report Status 02/22/2022 FINAL  Final   Organism ID, Bacteria METHICILLIN RESISTANT STAPHYLOCOCCUS AUREUS  Final      Susceptibility   Methicillin resistant staphylococcus aureus - MIC*    CIPROFLOXACIN >=8 RESISTANT Resistant     ERYTHROMYCIN >=8 RESISTANT Resistant     GENTAMICIN <=0.5 SENSITIVE Sensitive     OXACILLIN >=4 RESISTANT Resistant     TETRACYCLINE <=1 SENSITIVE Sensitive      VANCOMYCIN <=0.5 SENSITIVE Sensitive     TRIMETH/SULFA >=320 RESISTANT Resistant     CLINDAMYCIN >=8 RESISTANT Resistant     RIFAMPIN <=0.5 SENSITIVE Sensitive     Inducible Clindamycin NEGATIVE Sensitive     * MODERATE METHICILLIN RESISTANT STAPHYLOCOCCUS AUREUS  Fungus Culture Result     Status: None   Collection Time: 02/17/22  6:05 PM  Result Value Ref Range Status   Result 1 Comment  Final    Comment: (NOTE) KOH/Calcofluor preparation:  no fungus observed. Performed At: [facility] [address] [place], [place] [id number] Jolene Schimke MD OM:[id number]   Fungus Culture Result     Status: None   Collection Time: 02/17/22  6:05 PM  Result Value Ref Range Status   Result 1 Comment  Final    Comment: (NOTE) KOH/Calcofluor preparation:  no fungus observed. Performed At: [facility] [address] [place], [place] [id number] Jolene Schimke MD [id number]   Aerobic Culture w Gram Stain (superficial specimen)     Status: None   Collection Time: 02/18/22  3:43 PM   Specimen: Head; Abscess  Result Value Ref Range Status   Specimen Description HEAD  Final   Special Requests Normal  Final   Gram Stain   Final    RARE WBC PRESENT,BOTH PMN AND MONONUCLEAR FEW GRAM POSITIVE COCCI IN PAIRS IN SINGLES Performed at [facility], [address], [place],  [place] [address]    Culture   Final    MODERATE METHICILLIN RESISTANT STAPHYLOCOCCUS AUREUS   Report Status 02/20/2022 FINAL  Final   Organism ID, Bacteria METHICILLIN RESISTANT STAPHYLOCOCCUS AUREUS  Final      Susceptibility   Methicillin resistant staphylococcus aureus - MIC*    CIPROFLOXACIN >=8 RESISTANT Resistant     ERYTHROMYCIN >=8 RESISTANT Resistant     GENTAMICIN <=0.5 SENSITIVE Sensitive     OXACILLIN >=4 RESISTANT Resistant     TETRACYCLINE <=1 SENSITIVE Sensitive     VANCOMYCIN 1 SENSITIVE Sensitive     TRIMETH/SULFA >=320 RESISTANT Resistant     CLINDAMYCIN >=8 RESISTANT Resistant      RIFAMPIN <=0.5 SENSITIVE Sensitive     Inducible Clindamycin NEGATIVE Sensitive     * MODERATE METHICILLIN RESISTANT STAPHYLOCOCCUS AUREUS      Radiology Studies: No results found.     LOS: 11 days   Jacobb Alen [employer] on [email or web address]  02/26/2022, 11:12 AM

## 2022-02-27 MED ORDER — ACETAMINOPHEN 325 MG PO TABS
650.0000 mg | ORAL_TABLET | Freq: Four times a day (QID) | ORAL | Status: DC | PRN
  Administered 2022-02-27 – 2022-03-05 (×8): 650 mg via ORAL
  Filled 2022-02-27 (×8): qty 2

## 2022-02-27 MED ORDER — OXYCODONE HCL 5 MG PO TABS
5.0000 mg | ORAL_TABLET | Freq: Four times a day (QID) | ORAL | Status: DC | PRN
  Administered 2022-02-27 – 2022-03-05 (×18): 5 mg via ORAL
  Filled 2022-02-27 (×18): qty 1

## 2022-02-27 MED ORDER — GABAPENTIN 100 MG PO CAPS
100.0000 mg | ORAL_CAPSULE | Freq: Three times a day (TID) | ORAL | Status: DC
  Administered 2022-02-27 – 2022-03-05 (×19): 100 mg via ORAL
  Filled 2022-02-27 (×19): qty 1

## 2022-02-27 NOTE — Progress Notes (Signed)
[facility] PROGRESS NOTE   Eric Woods [id number] DOB: Aug 01, 1975 DOA: 02/14/2022  PCP: Patient, No Pcp Per  Brief History/Interval Summary: 46 y.o. male with a history of methamphetamine use, incarceration, skin picking behavior, recurrent MRSA infections who presented with left knee pain and several areas of skin and soft tissue infections. Broad IV antibiotics were started, narrowed to vancomycin and the patient was admitted. Orthopedics consulted, performed bedside left knee I&D 9/8. Blood cultures have grown MRSA. TTE and then subsequent TEE without vegetation. Left knee washout in OR performed 9/10 noting deep abscess without joint involvement. Also underwent bedside I&D of left forehead abscess by plastics on 9/11. Continues on IV antibiotics, vancomycin switched to daptomycin on 9/12. Repeat blood cultures 9/10 are NGTD.  Consultants: Infectious disease.  Orthopedics.  Plastic surgery.  Phone discussion with ophthalmology.   Subjective/Interval History: Patient denies any complaints.  Eye is feeling better.    Assessment/Plan:  Multifocal SSTI, MRSA bacteremia No vegetation on TEE. Continue IV antibiotics for 6 weeks per ID.  Vancomycin was switched over to daptomycin for ease of dosing.  Pharmacy managing.  CK levels being followed weekly.  Last day of antibiotics is 10/22. Has had previous hospitalization for MRSA infection and patient is "unsure" whether he took oral doxycycline after discharge.  Repeat blood cultures 9/10 sent, NGTD HIV NR July 2023, RPR NR, HCV and HPV SAg negative/NR. Patient remained stable.  WBC has improved.  He is afebrile.  Check labs tomorrow.   Left knee cellulitis and abscess Orthopedics, Dr. Griffin Basil, performed operative I&D 9/10. Cultures MRSA. Distal LLE swelling improving, negative venous U/S for DVT.   Wound care as per orthopedics.   Left forehead cellulitis and abscess S/p bedside I&D 9/11 by plastics with 5cc purulent  discharge collected. Culture +MRSA.   Left preseptal cellulitis Was discussed with ophthalmology, Dr. Posey Pronto on 9/9. Developing more superficial eruption localized to left eyelid and nose on 9/14. Still no changes in vision, painful EOM, or proptosis.  Patient had noted more swelling of the eyelids so a CT of the orbits was performed on 9/15 which did not show any evidence for orbital involvement or any abscess.  Patient reassured.  Continue with ointment to the eyelid. Eyelid swelling appears to be improving.   Contact dermatitis Possible heat rash superiorly to left knee. Pruritic without evidence of infection/fluid collection. Betadine irritation is possible though less likely as there are areas with betadine without irritation. Triamcinolone topically.  Rash has improved significantly.  Normocytic anemia Hemoglobin stable.  No evidence of overt blood loss.   Polysubstance abuse UDS +amphetamine, THC. Cessation counseling provided.  Precludes discharge with PICC line in place.   Tobacco use:  Cessation counseling provided   Hyponatremia Resolved   Hypokalemia Supplemented.   Urinary hesitancy UA negative. Symptoms currently resolved.   DVT Prophylaxis: Lovenox Code Status: Full code Family Communication: Discussed with patient Disposition Plan: Will stay in the hospital till completion of antibiotic therapy (end date: 03/31/22)  Status is: Inpatient Remains inpatient appropriate because: MRSA bacteremia      Medications: Scheduled:  celecoxib  100 mg Oral BID   chlorhexidine  60 mL Topical Once   docusate sodium  100 mg Oral BID   enoxaparin (LOVENOX) injection  40 mg Subcutaneous Q24H   lidocaine-EPINEPHrine  20 mL Infiltration Once   mupirocin ointment   Topical BID   povidone-iodine  2 Application Topical Once   sodium chloride flush  3 mL Intravenous Q12H   triamcinolone  cream   Topical TID   Continuous:  DAPTOmycin (CUBICIN) 600 mg in sodium chloride 0.9 %  IVPB 600 mg (02/26/22 2039)   [id number], hydrALAZINE, methocarbamol, ondansetron **OR** ondansetron (ZOFRAN) IV, oxyCODONE, polyethylene glycol  Antibiotics: Anti-infectives (From admission, onward)    Start     Dose/Rate Route Frequency Ordered Stop   02/19/22 2000  DAPTOmycin (CUBICIN) 600 mg in sodium chloride 0.9 % IVPB        8 mg/kg  77.1 kg 124 mL/hr over 30 Minutes Intravenous Daily 02/19/22 0931 03/31/22 2359   02/19/22 0600  ceFAZolin (ANCEF) IVPB 2g/100 mL premix        2 g 200 mL/hr over 30 Minutes Intravenous On call to O.R. 02/18/22 1146 02/19/22 0551   02/16/22 2200  vancomycin (VANCOREADY) IVPB 1500 mg/300 mL        1,500 mg 150 mL/hr over 120 Minutes Intravenous Every 12 hours 02/16/22 1123 02/19/22 1200   02/15/22 1730  vancomycin (VANCOCIN) IVPB 1000 mg/200 mL premix  Status:  Discontinued        1,000 mg 200 mL/hr over 60 Minutes Intravenous Every 12 hours 02/15/22 0658 02/15/22 0658   02/15/22 1730  vancomycin (VANCOCIN) IVPB 1000 mg/200 mL premix  Status:  Discontinued        1,000 mg 200 mL/hr over 60 Minutes Intravenous Every 12 hours 02/15/22 0658 02/16/22 1123   02/15/22 0445  vancomycin (VANCOREADY) IVPB 1500 mg/300 mL        1,500 mg 150 mL/hr over 120 Minutes Intravenous  Once 02/15/22 0442 02/15/22 0836       Objective:  Vital Signs  Vitals:   02/26/22 0849 02/26/22 1634 02/26/22 2138 02/27/22 0818  BP: (!) 136/106 (!) 139/91 (!) 142/90 115/88  Pulse: 89 84 95 82  Resp: 17 17 18 17   Temp: 98.7 F (37.1 C) 99.1 F (37.3 C) 98.5 F (36.9 C) 98.3 F (36.8 C)  TempSrc: Oral Oral Oral Oral  SpO2: 100% 99% 96% 100%  Weight:      Height:       No intake or output data in the 24 hours ending 02/27/22 0858  Filed Weights   02/15/22 0454 02/17/22 1715  Weight: 77.1 kg 77.1 kg   Patient is awake alert. Left eyelid swelling has significantly improved.    Lab Results:  Data Reviewed: I have personally reviewed following labs and  reports of the imaging studies  CBC: Recent Labs  Lab 02/22/22 0053 02/24/22 0031  WBC 13.3* 11.9*  HGB 12.1* 12.3*  HCT 37.2* 36.8*  MCV 99.5 97.1  PLT 490* 597*     Basic Metabolic Panel: Recent Labs  Lab 02/22/22 0053 02/24/22 0027 02/24/22 0031  NA 138  --  140  K 3.8  --  3.8  CL 104  --  103  CO2 24  --  26  GLUCOSE 97  --  100*  BUN 14  --  16  CREATININE 0.74  --  0.84  CALCIUM 8.7*  --  8.7*  MG  --  2.3  --      GFR: Estimated Creatinine Clearance: 121.1 mL/min (by C-G formula based on SCr of 0.84 mg/dL).  Cardiac Enzymes: Recent Labs  Lab 02/26/22 0729  CKTOTAL 44*      Recent Results (from the past 240 hour(s))  Surgical pcr screen     Status: Abnormal   Collection Time: 02/17/22 11:30 AM   Specimen: Nasal Mucosa; Nasal Swab  Result Value Ref Range  Status   MRSA, PCR POSITIVE (A) NEGATIVE Final   Staphylococcus aureus POSITIVE (A) NEGATIVE Final    Comment: RESULT CALLED TO, READ BACK BY AND VERIFIED WITH: 02/17/2022 @ 1605 RN ANGELITO, ADC (NOTE) The Xpert SA Assay (FDA approved for NASAL specimens in patients 17 years of age and older), is one component of a comprehensive surveillance program. It is not intended to diagnose infection nor to guide or monitor treatment. Performed at [facility], [place] [address], [place], [place] [address]   Fungus Culture With Stain     Status: None (Preliminary result)   Collection Time: 02/17/22  6:05 PM   Specimen: Abscess  Result Value Ref Range Status   Fungus Stain Final report  Final    Comment: (NOTE) Performed At: [facility] [place], [place] [id number] Rush Farmer MD [id number]    Fungus (Mycology) Culture PENDING  Incomplete   Fungal Source ABSCESS  Final    Comment: LEFT KNEE Performed at [facility], [place] [address] [place], [place] [address]   Fungus Culture With Stain     Status: None (Preliminary result)   Collection Time: 02/17/22   6:05 PM   Specimen: Abscess  Result Value Ref Range Status   Fungus Stain Final report  Final    Comment: (NOTE) Performed At: [facility] [place], [place] [id number] Rush Farmer MD [id number]    Fungus (Mycology) Culture PENDING  Incomplete   Fungal Source ABSCESS  Final    Comment: LEFT KNEE Performed at [facility], [place] [address], [place], [place] [address]   Aerobic/Anaerobic Culture w Gram Stain (surgical/deep wound)     Status: None   Collection Time: 02/17/22  6:05 PM   Specimen: Abscess  Result Value Ref Range Status   Specimen Description ABSCESS LEFT KNEE  Final   Special Requests SPECIMEN A  Final   Gram Stain   Final    MODERATE WBC PRESENT,BOTH PMN AND MONONUCLEAR FEW GRAM POSITIVE COCCI IN CLUSTERS RARE GRAM POSITIVE COCCI IN PAIRS    Culture   Final    ABUNDANT METHICILLIN RESISTANT STAPHYLOCOCCUS AUREUS NO ANAEROBES ISOLATED Performed at [facility], [place] [address], [place], [place] [address]    Report Status 02/22/2022 FINAL  Final   Organism ID, Bacteria METHICILLIN RESISTANT STAPHYLOCOCCUS AUREUS  Final      Susceptibility   Methicillin resistant staphylococcus aureus - MIC*    CIPROFLOXACIN >=8 RESISTANT Resistant     ERYTHROMYCIN >=8 RESISTANT Resistant     GENTAMICIN <=0.5 SENSITIVE Sensitive     OXACILLIN >=4 RESISTANT Resistant     TETRACYCLINE <=1 SENSITIVE Sensitive     VANCOMYCIN <=0.5 SENSITIVE Sensitive     TRIMETH/SULFA >=320 RESISTANT Resistant     CLINDAMYCIN >=8 RESISTANT Resistant     RIFAMPIN <=0.5 SENSITIVE Sensitive     Inducible Clindamycin NEGATIVE Sensitive     * ABUNDANT METHICILLIN RESISTANT STAPHYLOCOCCUS AUREUS  Aerobic/Anaerobic Culture w Gram Stain (surgical/deep wound)     Status: None   Collection Time: 02/17/22  6:05 PM   Specimen: Abscess  Result Value Ref Range Status   Specimen Description ABSCESS LEFT KNEE  Final   Special Requests SPECIMEN B  Final   Gram Stain    Final    FEW WBC PRESENT,BOTH PMN AND MONONUCLEAR RARE GRAM POSITIVE COCCI IN PAIRS    Culture   Final    MODERATE METHICILLIN RESISTANT STAPHYLOCOCCUS AUREUS NO ANAEROBES  ISOLATED Performed at [facility], [place] [address], [place], [place] [address]    Report Status 02/22/2022 FINAL  Final   Organism ID, Bacteria METHICILLIN RESISTANT STAPHYLOCOCCUS AUREUS  Final      Susceptibility   Methicillin resistant staphylococcus aureus - MIC*    CIPROFLOXACIN >=8 RESISTANT Resistant     ERYTHROMYCIN >=8 RESISTANT Resistant     GENTAMICIN <=0.5 SENSITIVE Sensitive     OXACILLIN >=4 RESISTANT Resistant     TETRACYCLINE <=1 SENSITIVE Sensitive     VANCOMYCIN <=0.5 SENSITIVE Sensitive     TRIMETH/SULFA >=320 RESISTANT Resistant     CLINDAMYCIN >=8 RESISTANT Resistant     RIFAMPIN <=0.5 SENSITIVE Sensitive     Inducible Clindamycin NEGATIVE Sensitive     * MODERATE METHICILLIN RESISTANT STAPHYLOCOCCUS AUREUS  Fungus Culture Result     Status: None   Collection Time: 02/17/22  6:05 PM  Result Value Ref Range Status   Result 1 Comment  Final    Comment: (NOTE) KOH/Calcofluor preparation:  no fungus observed. Performed At: [facility] [place], [place] [id number] Rush Farmer MD [id number]   Fungus Culture Result     Status: None   Collection Time: 02/17/22  6:05 PM  Result Value Ref Range Status   Result 1 Comment  Final    Comment: (NOTE) KOH/Calcofluor preparation:  no fungus observed. Performed At: [facility] [place], [place] [id number] Rush Farmer MD [id number]   Aerobic Culture w Gram Stain (superficial specimen)     Status: None   Collection Time: 02/18/22  3:43 PM   Specimen: Head; Abscess  Result Value Ref Range Status   Specimen Description HEAD  Final   Special Requests Normal  Final   Gram Stain   Final    RARE WBC PRESENT,BOTH PMN AND MONONUCLEAR FEW GRAM POSITIVE COCCI IN PAIRS IN SINGLES Performed at  [facility], [address], [place],  [address]    Culture   Final    MODERATE METHICILLIN RESISTANT STAPHYLOCOCCUS AUREUS   Report Status 02/20/2022 FINAL  Final   Organism ID, Bacteria METHICILLIN RESISTANT STAPHYLOCOCCUS AUREUS  Final      Susceptibility   Methicillin resistant staphylococcus aureus - MIC*    CIPROFLOXACIN >=8 RESISTANT Resistant     ERYTHROMYCIN >=8 RESISTANT Resistant     GENTAMICIN <=0.5 SENSITIVE Sensitive     OXACILLIN >=4 RESISTANT Resistant     TETRACYCLINE <=1 SENSITIVE Sensitive     VANCOMYCIN 1 SENSITIVE Sensitive     TRIMETH/SULFA >=320 RESISTANT Resistant     CLINDAMYCIN >=8 RESISTANT Resistant     RIFAMPIN <=0.5 SENSITIVE Sensitive     Inducible Clindamycin NEGATIVE Sensitive     * MODERATE METHICILLIN RESISTANT STAPHYLOCOCCUS AUREUS      Radiology Studies: No results found.     LOS: 12 days   Dan Scearce [employer] on [email or web address]  02/27/2022, 8:58 AM

## 2022-02-27 NOTE — Progress Notes (Signed)
   ORTHOPAEDIC PROGRESS NOTE  s/p Procedure(s):  Left knee incision and debridement of deep abscess Left knee prepatellar bursectomy on 02/17/2022  SUBJECTIVE: Patient continues to feel better each day. He has had some drainage from the knee. Reports a burning pain. He remains inpatient for IV antibiotics.   OBJECTIVE: PE: Left lower extremity: erythema has improved significantly. About 6 inches of packing was removed today. Only tender to palpation around incision site. Nontender elsewhere of the left lower leg. No edema of the left lower leg. NVI  Vitals:   02/26/22 2138 02/27/22 0818  BP: (!) 142/90 115/88  Pulse: 95 82  Resp: 18 17  Temp: 98.5 F (36.9 C) 98.3 F (36.8 C)  SpO2: 96% 100%   Cultures showing MRSA  ASSESSMENT: Eric Woods is a 46 y.o. male POD#10  PLAN: Weightbearing: WBAT LLE Insicional and dressing care: Patient to have 3 inches of packing removed daily with daily dressing changes Orthopedic device(s): None Showering: Keep left knee wound covered when showering. Keep clean and dry.  VTE prophylaxis: per primary team, no orthopedic contraindications Pain control: per primary team. He should start to wean off narcotics at this point. I added Tylenol and Gabapentin to his current regimen. Continue Celebrex and Robaxin.  Follow - up plan: TBD. Will follow up during his hospital stay since he will be inpatient for 4 more weeks.  Dispo: can be discharged at the discretion of the medicine and infectious disease teams. Recommend continuing antibiotics per infectious disease. Patient to stay in hospital until he has completed 6 weeks of IV antibiotic therapy due to his previous history of substance abuse this precludes discharge with PICC line in place.  Contact information:  Dr. Ophelia Charter, Noemi Chapel, PA-C, After hours and holidays please check [email or web address] for group call information for [facility]   Noemi Chapel, PA-C 02/27/2022

## 2022-02-27 NOTE — Plan of Care (Signed)

## 2022-02-28 LAB — URINALYSIS, ROUTINE W REFLEX MICROSCOPIC
Bilirubin Urine: NEGATIVE
Glucose, UA: NEGATIVE mg/dL
Hgb urine dipstick: NEGATIVE
Ketones, ur: NEGATIVE mg/dL
Leukocytes,Ua: NEGATIVE
Nitrite: NEGATIVE
Protein, ur: NEGATIVE mg/dL
Specific Gravity, Urine: 1.021 (ref 1.005–1.030)
pH: 5 (ref 5.0–8.0)

## 2022-02-28 LAB — CBC
HCT: 35.1 % — ABNORMAL LOW (ref 39.0–52.0)
Hemoglobin: 11.6 g/dL — ABNORMAL LOW (ref 13.0–17.0)
MCH: 32.4 pg (ref 26.0–34.0)
MCHC: 33 g/dL (ref 30.0–36.0)
MCV: 98 fL (ref 80.0–100.0)
Platelets: 578 10*3/uL — ABNORMAL HIGH (ref 150–400)
RBC: 3.58 MIL/uL — ABNORMAL LOW (ref 4.22–5.81)
RDW: 13.1 % (ref 11.5–15.5)
WBC: 10.2 10*3/uL (ref 4.0–10.5)
nRBC: 0 % (ref 0.0–0.2)

## 2022-02-28 LAB — BASIC METABOLIC PANEL
Anion gap: 8 (ref 5–15)
BUN: 19 mg/dL (ref 6–20)
CO2: 26 mmol/L (ref 22–32)
Calcium: 8.7 mg/dL — ABNORMAL LOW (ref 8.9–10.3)
Chloride: 105 mmol/L (ref 98–111)
Creatinine, Ser: 0.98 mg/dL (ref 0.61–1.24)
GFR, Estimated: >60 mL/min (ref >60)
Glucose, Bld: 97 mg/dL (ref 70–99)
Potassium: 4 mmol/L (ref 3.5–5.1)
Sodium: 139 mmol/L (ref 135–145)

## 2022-02-28 LAB — RAPID URINE DRUG SCREEN, HOSP PERFORMED
Amphetamines: NOT DETECTED
Barbiturates: NOT DETECTED
Benzodiazepines: NOT DETECTED
Cocaine: NOT DETECTED
Opiates: NOT DETECTED
Tetrahydrocannabinol: NOT DETECTED

## 2022-02-28 NOTE — Progress Notes (Signed)
[facility] PROGRESS NOTE   Eric Woods YTK:[id number] DOB: 11-22-1975 DOA: 02/14/2022  PCP: Patient, No Pcp Per  Brief History/Interval Summary: 46 y.o. male with a history of methamphetamine use, incarceration, skin picking behavior, recurrent MRSA infections who presented with left knee pain and several areas of skin and soft tissue infections. Broad IV antibiotics were started, narrowed to vancomycin and the patient was admitted. Orthopedics consulted, performed bedside left knee I&D 9/8. Blood cultures have grown MRSA. TTE and then subsequent TEE without vegetation. Left knee washout in OR performed 9/10 noting deep abscess without joint involvement. Also underwent bedside I&D of left forehead abscess by plastics on 9/11. Continues on IV antibiotics, vancomycin switched to daptomycin on 9/12. Repeat blood cultures 9/10 are NGTD.  Consultants: Infectious disease.  Orthopedics.  Plastic surgery.  Phone discussion with ophthalmology.   Subjective/Interval History: No complaints offered.  Eye is better.  Had dressing change to his leg yesterday by orthopedics.  Assessment/Plan:  Multifocal SSTI, MRSA bacteremia No vegetation on TEE. Continue IV antibiotics for 6 weeks per ID.  Vancomycin was switched over to daptomycin for ease of dosing.  Pharmacy managing.  CK levels being followed weekly.  Last day of antibiotics is 10/22. Has had previous hospitalization for MRSA infection and patient is "unsure" whether he took oral doxycycline after discharge.  Repeat blood cultures 9/10 sent, NGTD HIV NR July 2023, RPR NR, HCV and HPV SAg negative/NR. Patient remains stable.  WBC is improving.  He remains afebrile.   Left knee cellulitis and abscess Orthopedics, Dr. Griffin Basil, performed operative I&D 9/10. Cultures MRSA. Distal LLE swelling improving, negative venous U/S for DVT.   Wound care as per orthopedics.  Seen by orthopedics yesterday.   Left forehead cellulitis and abscess S/p  bedside I&D 9/11 by plastics with 5cc purulent discharge collected. Culture +MRSA.   Left preseptal cellulitis Was discussed with ophthalmology, Dr. Posey Pronto on 9/9. Developing more superficial eruption localized to left eyelid and nose on 9/14. Still no changes in vision, painful EOM, or proptosis.  Patient had noted more swelling of the eyelids so a CT of the orbits was performed on 9/15 which did not show any evidence for orbital involvement or any abscess.  Patient reassured.  Continue with ointment to the eyelid. Eyelid swelling appears to be improving.   Contact dermatitis Possible heat rash superiorly to left knee. Pruritic without evidence of infection/fluid collection. Betadine irritation is possible though less likely as there are areas with betadine without irritation. Triamcinolone topically.  Rash has improved significantly.  Normocytic anemia Hemoglobin stable.  No evidence of overt blood loss.   Polysubstance abuse UDS +amphetamine, THC. Cessation counseling provided.  Precludes discharge with PICC line in place.   Tobacco use:  Cessation counseling provided   Hyponatremia Resolved   Hypokalemia Supplemented.   Urinary hesitancy UA negative. Symptoms currently resolved.   DVT Prophylaxis: Lovenox Code Status: Full code Family Communication: Discussed with patient Disposition Plan: Will stay in the hospital till completion of antibiotic therapy (end date: 03/31/22)  Status is: Inpatient Remains inpatient appropriate because: MRSA bacteremia      Medications: Scheduled:  celecoxib  100 mg Oral BID   chlorhexidine  60 mL Topical Once   docusate sodium  100 mg Oral BID   enoxaparin (LOVENOX) injection  40 mg Subcutaneous Q24H   gabapentin  100 mg Oral TID   lidocaine-EPINEPHrine  20 mL Infiltration Once   mupirocin ointment   Topical BID   povidone-iodine  2 Application  Topical Once   sodium chloride flush  3 mL Intravenous Q12H   triamcinolone cream    Topical TID   Continuous:  DAPTOmycin (CUBICIN) 600 mg in sodium chloride 0.9 % IVPB 600 mg (02/27/22 2207)   TDD:[id number], bisacodyl, hydrALAZINE, methocarbamol, ondansetron **OR** ondansetron (ZOFRAN) IV, oxyCODONE, polyethylene glycol  Antibiotics: Anti-infectives (From admission, onward)    Start     Dose/Rate Route Frequency Ordered Stop   02/19/22 2000  DAPTOmycin (CUBICIN) 600 mg in sodium chloride 0.9 % IVPB        8 mg/kg  77.1 kg 124 mL/hr over 30 Minutes Intravenous Daily 02/19/22 0931 03/31/22 2359   02/19/22 0600  ceFAZolin (ANCEF) IVPB 2g/100 mL premix        2 g 200 mL/hr over 30 Minutes Intravenous On call to O.R. 02/18/22 1146 02/19/22 0551   02/16/22 2200  vancomycin (VANCOREADY) IVPB 1500 mg/300 mL        1,500 mg 150 mL/hr over 120 Minutes Intravenous Every 12 hours 02/16/22 1123 02/19/22 1200   02/15/22 1730  vancomycin (VANCOCIN) IVPB 1000 mg/200 mL premix  Status:  Discontinued        1,000 mg 200 mL/hr over 60 Minutes Intravenous Every 12 hours 02/15/22 0658 02/15/22 0658   02/15/22 1730  vancomycin (VANCOCIN) IVPB 1000 mg/200 mL premix  Status:  Discontinued        1,000 mg 200 mL/hr over 60 Minutes Intravenous Every 12 hours 02/15/22 0658 02/16/22 1123   02/15/22 0445  vancomycin (VANCOREADY) IVPB 1500 mg/300 mL        1,500 mg 150 mL/hr over 120 Minutes Intravenous  Once 02/15/22 0442 02/15/22 0836       Objective:  Vital Signs  Vitals:   02/26/22 2138 02/27/22 0818 02/27/22 1423 02/27/22 2033  BP: (!) 142/90 115/88 (!) 132/101 118/76  Pulse: 95 82 95 83  Resp: 18 17 17 18   Temp: 98.5 F (36.9 C) 98.3 F (36.8 C) 99.3 F (37.4 C) 98.8 F (37.1 C)  TempSrc: Oral Oral Oral Oral  SpO2: 96% 100% 100% 100%  Weight:      Height:        Intake/Output Summary (Last 24 hours) at 02/28/2022 0929 Last data filed at 02/27/2022 1500 Gross per 24 hour  Intake 240 ml  Output 4 ml  Net 236 ml    Filed Weights   02/15/22 0454 02/17/22 1715   Weight: 77.1 kg 77.1 kg   Patient is awake alert. Left eyelid swelling is improving.   He is ambulating in the room.    Lab Results:  Data Reviewed: I have personally reviewed following labs and reports of the imaging studies  CBC: Recent Labs  Lab 02/22/22 0053 02/24/22 0031 02/28/22 0113  WBC 13.3* 11.9* 10.2  HGB 12.1* 12.3* 11.6*  HCT 37.2* 36.8* 35.1*  MCV 99.5 97.1 98.0  PLT 490* 597* 578*     Basic Metabolic Panel: Recent Labs  Lab 02/22/22 0053 02/24/22 0027 02/24/22 0031 02/28/22 0113  NA 138  --  140 139  K 3.8  --  3.8 4.0  CL 104  --  103 105  CO2 24  --  26 26  GLUCOSE 97  --  100* 97  BUN 14  --  16 19  CREATININE 0.74  --  0.84 0.98  CALCIUM 8.7*  --  8.7* 8.7*  MG  --  2.3  --   --      GFR: Estimated Creatinine Clearance: 103.8  mL/min (by C-G formula based on SCr of 0.98 mg/dL).  Cardiac Enzymes: Recent Labs  Lab 02/26/22 0729  CKTOTAL 44*      Recent Results (from the past 240 hour(s))  Aerobic Culture w Gram Stain (superficial specimen)     Status: None   Collection Time: 02/18/22  3:43 PM   Specimen: Head; Abscess  Result Value Ref Range Status   Specimen Description HEAD  Final   Special Requests Normal  Final   Gram Stain   Final    RARE WBC PRESENT,BOTH PMN AND MONONUCLEAR FEW GRAM POSITIVE COCCI IN PAIRS IN SINGLES Performed at [facility], [address], [place], [place] [address]    Culture   Final    MODERATE METHICILLIN RESISTANT STAPHYLOCOCCUS AUREUS   Report Status 02/20/2022 FINAL  Final   Organism ID, Bacteria METHICILLIN RESISTANT STAPHYLOCOCCUS AUREUS  Final      Susceptibility   Methicillin resistant staphylococcus aureus - MIC*    CIPROFLOXACIN >=8 RESISTANT Resistant     ERYTHROMYCIN >=8 RESISTANT Resistant     GENTAMICIN <=0.5 SENSITIVE Sensitive     OXACILLIN >=4 RESISTANT Resistant     TETRACYCLINE <=1 SENSITIVE Sensitive     VANCOMYCIN 1 SENSITIVE Sensitive     TRIMETH/SULFA >=320  RESISTANT Resistant     CLINDAMYCIN >=8 RESISTANT Resistant     RIFAMPIN <=0.5 SENSITIVE Sensitive     Inducible Clindamycin NEGATIVE Sensitive     * MODERATE METHICILLIN RESISTANT STAPHYLOCOCCUS AUREUS      Radiology Studies: No results found.     LOS: 13 days   Eunique Balik [employer] on [email or web address]  02/28/2022, 9:29 AM

## 2022-02-28 NOTE — Plan of Care (Signed)

## 2022-03-01 MED ORDER — INFLUENZA VAC SPLIT QUAD 0.5 ML IM SUSY
0.5000 mL | PREFILLED_SYRINGE | INTRAMUSCULAR | Status: AC
  Administered 2022-03-02: 0.5 mL via INTRAMUSCULAR
  Filled 2022-03-01: qty 0.5

## 2022-03-01 NOTE — Progress Notes (Signed)
[facility] PROGRESS NOTE   Eric Woods ZOX:[id number] DOB: 1975/12/17 DOA: 02/14/2022  PCP: Patient, No Pcp Per  Brief History/Interval Summary: 46 y.o. male with a history of methamphetamine use, incarceration, skin picking behavior, recurrent MRSA infections who presented with left knee pain and several areas of skin and soft tissue infections. Broad IV antibiotics were started, narrowed to vancomycin and the patient was admitted. Orthopedics consulted, performed bedside left knee I&D 9/8. Blood cultures have grown MRSA. TTE and then subsequent TEE without vegetation. Left knee washout in OR performed 9/10 noting deep abscess without joint involvement. Also underwent bedside I&D of left forehead abscess by plastics on 9/11. Continues on IV antibiotics, vancomycin switched to daptomycin on 9/12. Repeat blood cultures 9/10 are NGTD.  Consultants: Infectious disease.  Orthopedics.  Plastic surgery.  Phone discussion with ophthalmology.   Subjective/Interval History: No acute issues or events overnight, multiple questions in regards to his chronic medical care including PCP, flu vaccine and similar but no acute issues or events recently and otherwise feels markedly improved from prior.  Assessment/Plan:  Multifocal SSTI, MRSA bacteremia No vegetation on TEE. Continue IV antibiotics for 6 weeks per ID.  Vancomycin was switched over to daptomycin for ease of dosing.  Pharmacy managing.  CK levels being followed weekly.  Last day of antibiotics is 10/22. Has had previous hospitalization for MRSA infection and patient is "unsure" whether he took oral doxycycline after discharge.  Repeat blood cultures 9/10 sent, NGTD HIV NR July 2023, RPR NR, HCV and HPV SAg negative/NR. Patient remains stable.  WBC is improving.  He remains afebrile.   Left knee cellulitis and abscess Orthopedics, Dr. Everardo Pacific, performed operative I&D 9/10. Cultures MRSA. Distal LLE swelling improving, negative venous  U/S for DVT.   Wound care as per orthopedics.  Seen by orthopedics yesterday.   Left forehead cellulitis and abscess S/p bedside I&D 9/11 by plastics with 5cc purulent discharge collected. Culture +MRSA.   Left preseptal cellulitis Was discussed with ophthalmology, Dr. Allena Katz on 9/9. Developing more superficial eruption localized to left eyelid and nose on 9/14. Still no changes in vision, painful EOM, or proptosis.  Patient had noted more swelling of the eyelids so a CT of the orbits was performed on 9/15 which did not show any evidence for orbital involvement or any abscess.  Patient reassured.  Continue with ointment to the eyelid. Eyelid swelling appears to be improving.   Contact dermatitis Possible heat rash superiorly to left knee. Pruritic without evidence of infection/fluid collection. Betadine irritation is possible though less likely as there are areas with betadine without irritation. Triamcinolone topically.  Rash has improved significantly.  Normocytic anemia Hemoglobin stable.  No evidence of overt blood loss.   Polysubstance abuse UDS +amphetamine, THC. Cessation counseling provided.  Precludes discharge with PICC line in place.   Tobacco use:  Cessation counseling provided   Hyponatremia Resolved   Hypokalemia Supplemented.   Urinary hesitancy UA negative. Symptoms currently resolved.   DVT Prophylaxis: Lovenox Code Status: Full code Family Communication: Discussed with patient Disposition Plan: Will stay in the hospital till completion of antibiotic therapy (end date: 03/31/22)  Status is: Inpatient Remains inpatient appropriate because: MRSA bacteremia      Medications: Scheduled:  celecoxib  100 mg Oral BID   chlorhexidine  60 mL Topical Once   docusate sodium  100 mg Oral BID   enoxaparin (LOVENOX) injection  40 mg Subcutaneous Q24H   gabapentin  100 mg Oral TID   lidocaine-EPINEPHrine  20 mL Infiltration Once   mupirocin ointment   Topical BID    povidone-iodine  2 Application Topical Once   sodium chloride flush  3 mL Intravenous Q12H   triamcinolone cream   Topical TID   Continuous:  DAPTOmycin (CUBICIN) 600 mg in sodium chloride 0.9 % IVPB 600 mg (02/28/22 2036)   [id number], bisacodyl, hydrALAZINE, methocarbamol, ondansetron **OR** ondansetron (ZOFRAN) IV, oxyCODONE, polyethylene glycol  Antibiotics: Anti-infectives (From admission, onward)    Start     Dose/Rate Route Frequency Ordered Stop   02/19/22 2000  DAPTOmycin (CUBICIN) 600 mg in sodium chloride 0.9 % IVPB        8 mg/kg  77.1 kg 124 mL/hr over 30 Minutes Intravenous Daily 02/19/22 0931 03/31/22 2359   02/19/22 0600  ceFAZolin (ANCEF) IVPB 2g/100 mL premix        2 g 200 mL/hr over 30 Minutes Intravenous On call to O.R. 02/18/22 1146 02/19/22 0551   02/16/22 2200  vancomycin (VANCOREADY) IVPB 1500 mg/300 mL        1,500 mg 150 mL/hr over 120 Minutes Intravenous Every 12 hours 02/16/22 1123 02/19/22 1200   02/15/22 1730  vancomycin (VANCOCIN) IVPB 1000 mg/200 mL premix  Status:  Discontinued        1,000 mg 200 mL/hr over 60 Minutes Intravenous Every 12 hours 02/15/22 0658 02/15/22 0658   02/15/22 1730  vancomycin (VANCOCIN) IVPB 1000 mg/200 mL premix  Status:  Discontinued        1,000 mg 200 mL/hr over 60 Minutes Intravenous Every 12 hours 02/15/22 0658 02/16/22 1123   02/15/22 0445  vancomycin (VANCOREADY) IVPB 1500 mg/300 mL        1,500 mg 150 mL/hr over 120 Minutes Intravenous  Once 02/15/22 0442 02/15/22 0836       Objective:  Vital Signs  Vitals:   02/27/22 0818 02/27/22 1423 02/27/22 2033 03/01/22 0620  BP: 115/88 (!) 132/101 118/76 (!) 130/97  Pulse: 82 95 83 85  Resp: 17 17 18 18   Temp: 98.3 F (36.8 C) 99.3 F (37.4 C) 98.8 F (37.1 C) 98.6 F (37 C)  TempSrc: Oral Oral Oral Oral  SpO2: 100% 100% 100% 100%  Weight:      Height:       No intake or output data in the 24 hours ending 03/01/22 0806  Filed Weights    02/15/22 0454 02/17/22 1715  Weight: 77.1 kg 77.1 kg   Patient is awake alert. Left eyelid swelling is improving.   He is ambulating in the room.    Lab Results:  Data Reviewed: I have personally reviewed following labs and reports of the imaging studies  CBC: Recent Labs  Lab 02/24/22 0031 02/28/22 0113  WBC 11.9* 10.2  HGB 12.3* 11.6*  HCT 36.8* 35.1*  MCV 97.1 98.0  PLT 597* 578*     Basic Metabolic Panel: Recent Labs  Lab 02/24/22 0027 02/24/22 0031 02/28/22 0113  NA  --  140 139  K  --  3.8 4.0  CL  --  103 105  CO2  --  26 26  GLUCOSE  --  100* 97  BUN  --  16 19  CREATININE  --  0.84 0.98  CALCIUM  --  8.7* 8.7*  MG 2.3  --   --      GFR: Estimated Creatinine Clearance: 103.8 mL/min (by C-G formula based on SCr of 0.98 mg/dL).  Cardiac Enzymes: Recent Labs  Lab 02/26/22 0729  CKTOTAL 44*  No results found for this or any previous visit (from the past 240 hour(s)).     Radiology Studies: No results found.     LOS: 14 days   Azucena Fallen  [facility] Pager on [email or web address]  03/01/2022, 8:06 AM

## 2022-03-02 NOTE — Progress Notes (Signed)
[facility] PROGRESS NOTE   Eric Woods NID:[id number] DOB: 1976-01-04 DOA: 02/14/2022  PCP: Patient, No Pcp Per  Brief History/Interval Summary: 46 y.o. male with a history of methamphetamine use, incarceration, skin picking behavior, recurrent MRSA infections who presented with left knee pain and several areas of skin and soft tissue infections. Broad IV antibiotics were started, narrowed to vancomycin and the patient was admitted. Orthopedics consulted, performed bedside left knee I&D 9/8. Blood cultures have grown MRSA. TTE and then subsequent TEE without vegetation. Left knee washout in OR performed 9/10 noting deep abscess without joint involvement. Also underwent bedside I&D of left forehead abscess by plastics on 9/11. Continues on IV antibiotics, vancomycin switched to daptomycin on 9/12. Repeat blood cultures 9/10 are NGTD.  Consultants: Infectious disease.  Orthopedics.  Plastic surgery.  Phone discussion with ophthalmology.   Subjective/Interval History: No acute issues or events overnight, multiple questions in regards to his chronic medical care including PCP, flu vaccine and similar but no acute issues or events recently and otherwise feels markedly improved from prior.  Assessment/Plan:  Multifocal SSTI, MRSA bacteremia No vegetation on TEE. Continue IV antibiotics for 6 weeks per ID.  Vancomycin was switched over to daptomycin for ease of dosing.  Pharmacy managing.  CK levels being followed weekly.  Last day of antibiotics is 10/22. Has had previous hospitalization for MRSA infection and patient is "unsure" whether he took oral doxycycline after discharge.  Repeat blood cultures 9/10 sent, NGTD HIV NR July 2023, RPR NR, HCV and HPV SAg negative/NR. Patient remains stable.  WBC is improving.  He remains afebrile.   Left knee cellulitis and abscess Orthopedics, Dr. Griffin Basil, performed operative I&D 9/10. Cultures MRSA. Distal LLE swelling improving, negative venous  U/S for DVT.   Wound care as per orthopedics.  Seen by orthopedics yesterday.   Left forehead cellulitis and abscess S/p bedside I&D 9/11 by plastics with 5cc purulent discharge collected. Culture +MRSA.   Left preseptal cellulitis Was discussed with ophthalmology, Dr. Posey Pronto on 9/9. Developing more superficial eruption localized to left eyelid and nose on 9/14. Still no changes in vision, painful EOM, or proptosis.  Patient had noted more swelling of the eyelids so a CT of the orbits was performed on 9/15 which did not show any evidence for orbital involvement or any abscess.  Patient reassured.  Continue with ointment to the eyelid. Eyelid swelling appears to be improving.   Contact dermatitis Possible heat rash superiorly to left knee. Pruritic without evidence of infection/fluid collection. Betadine irritation is possible though less likely as there are areas with betadine without irritation. Triamcinolone topically.  Rash has improved significantly.  Normocytic anemia Hemoglobin stable.  No evidence of overt blood loss.   Polysubstance abuse UDS +amphetamine, THC. Cessation counseling provided.  Precludes discharge with PICC line in place.   Tobacco use:  Cessation counseling provided   Hyponatremia Resolved   Hypokalemia Supplemented.   Urinary hesitancy UA negative. Symptoms currently resolved.   DVT Prophylaxis: Lovenox Code Status: Full code Family Communication: Discussed with patient Disposition Plan: Will stay in the hospital till completion of antibiotic therapy (end date: 03/31/22)  Status is: Inpatient Remains inpatient appropriate because: MRSA bacteremia      Medications: Scheduled:  celecoxib  100 mg Oral BID   docusate sodium  100 mg Oral BID   enoxaparin (LOVENOX) injection  40 mg Subcutaneous Q24H   gabapentin  100 mg Oral TID   influenza vac split quadrivalent PF  0.5 mL Intramuscular Tomorrow-1000  mupirocin ointment   Topical BID   sodium  chloride flush  3 mL Intravenous Q12H   triamcinolone cream   Topical TID   Continuous:  DAPTOmycin (CUBICIN) 600 mg in sodium chloride 0.9 % IVPB Stopped (03/01/22 2015)   [id number], bisacodyl, hydrALAZINE, methocarbamol, ondansetron **OR** ondansetron (ZOFRAN) IV, oxyCODONE, polyethylene glycol  Antibiotics: Anti-infectives (From admission, onward)    Start     Dose/Rate Route Frequency Ordered Stop   02/19/22 2000  DAPTOmycin (CUBICIN) 600 mg in sodium chloride 0.9 % IVPB        8 mg/kg  77.1 kg 124 mL/hr over 30 Minutes Intravenous Daily 02/19/22 0931 03/31/22 2359   02/19/22 0600  ceFAZolin (ANCEF) IVPB 2g/100 mL premix        2 g 200 mL/hr over 30 Minutes Intravenous On call to O.R. 02/18/22 1146 02/19/22 0551   02/16/22 2200  vancomycin (VANCOREADY) IVPB 1500 mg/300 mL        1,500 mg 150 mL/hr over 120 Minutes Intravenous Every 12 hours 02/16/22 1123 02/19/22 1200   02/15/22 1730  vancomycin (VANCOCIN) IVPB 1000 mg/200 mL premix  Status:  Discontinued        1,000 mg 200 mL/hr over 60 Minutes Intravenous Every 12 hours 02/15/22 0658 02/15/22 0658   02/15/22 1730  vancomycin (VANCOCIN) IVPB 1000 mg/200 mL premix  Status:  Discontinued        1,000 mg 200 mL/hr over 60 Minutes Intravenous Every 12 hours 02/15/22 0658 02/16/22 1123   02/15/22 0445  vancomycin (VANCOREADY) IVPB 1500 mg/300 mL        1,500 mg 150 mL/hr over 120 Minutes Intravenous  Once 02/15/22 0442 02/15/22 0836       Objective:  Vital Signs  Vitals:   03/01/22 0851 03/01/22 1925 03/02/22 0355 03/02/22 0738  BP: (!) 132/94 (!) 147/101 115/75 (!) 133/91  Pulse: 85 (!) 102 74 86  Resp: 16   20  Temp: 98.3 F (36.8 C) 98.6 F (37 C) 98.6 F (37 C) 98.3 F (36.8 C)  TempSrc: Oral   Oral  SpO2: 100% 99% 100% 100%  Weight:      Height:       No intake or output data in the 24 hours ending 03/02/22 0813  Filed Weights   02/15/22 0454 02/17/22 1715  Weight: 77.1 kg 77.1 kg   Patient is  awake alert. Left eyelid swelling is improving.   He is ambulating in the room.    Lab Results:  Data Reviewed: I have personally reviewed following labs and reports of the imaging studies  CBC: Recent Labs  Lab 02/24/22 0031 02/28/22 0113  WBC 11.9* 10.2  HGB 12.3* 11.6*  HCT 36.8* 35.1*  MCV 97.1 98.0  PLT 597* 578*     Basic Metabolic Panel: Recent Labs  Lab 02/24/22 0027 02/24/22 0031 02/28/22 0113  NA  --  140 139  K  --  3.8 4.0  CL  --  103 105  CO2  --  26 26  GLUCOSE  --  100* 97  BUN  --  16 19  CREATININE  --  0.84 0.98  CALCIUM  --  8.7* 8.7*  MG 2.3  --   --      GFR: Estimated Creatinine Clearance: 103.8 mL/min (by C-G formula based on SCr of 0.98 mg/dL).  Cardiac Enzymes: Recent Labs  Lab 02/26/22 0729  CKTOTAL 44*      No results found for this or any previous visit (  from the past 240 hour(s)).     Radiology Studies: No results found.     LOS: 15 days   Little Ishikawa  [facility] Pager on [email or web address]  03/02/2022, 8:13 AM

## 2022-03-03 MED ORDER — ORAL CARE MOUTH RINSE: 15.0000 mL | OROMUCOSAL | Status: DC | PRN

## 2022-03-03 NOTE — Progress Notes (Signed)
[facility] PROGRESS NOTE   Eric Woods SNK:[id number] DOB: 1975-10-06 DOA: 02/14/2022  PCP: Patient, No Pcp Per  Brief History/Interval Summary: 46 y.o. male with a history of methamphetamine use, incarceration, skin picking behavior, recurrent MRSA infections who presented with left knee pain and several areas of skin and soft tissue infections. Broad IV antibiotics were started, narrowed to vancomycin and the patient was admitted. Orthopedics consulted, performed bedside left knee I&D 9/8. Blood cultures have grown MRSA. TTE and then subsequent TEE without vegetation. Left knee washout in OR performed 9/10 noting deep abscess without joint involvement. Also underwent bedside I&D of left forehead abscess by plastics on 9/11. Continues on IV antibiotics, vancomycin switched to daptomycin on 9/12. Repeat blood cultures 9/10 are NGTD.  Consultants: Infectious disease.  Orthopedics.  Plastic surgery.  Phone discussion with ophthalmology.   Subjective/Interval History: No acute issues or events overnight.  Assessment/Plan:  Multifocal SSTI, MRSA bacteremia No vegetation on TEE. Continue IV antibiotics for 6 weeks per ID.  Vancomycin was switched over to daptomycin for ease of dosing.  Pharmacy managing.  CK levels being followed weekly.  Last day of antibiotics is 10/22. Has had previous hospitalization for MRSA infection and patient is "unsure" whether he took oral doxycycline after discharge.  Repeat blood cultures 9/10 sent, NGTD HIV NR July 2023, RPR NR, HCV and HPV SAg negative/NR. Patient remains stable.  WBC is improving.  He remains afebrile.   Left knee cellulitis and abscess Orthopedics, Dr. Everardo Pacific, performed operative I&D 9/10. Cultures MRSA. Distal LLE swelling improving, negative venous U/S for DVT.   Wound care as per orthopedics.  Seen by orthopedics yesterday.   Left forehead cellulitis and abscess S/p bedside I&D 9/11 by plastics with 5cc purulent discharge  collected. Culture +MRSA.   Left preseptal cellulitis Was discussed with ophthalmology, Dr. Allena Katz on 9/9. Developing more superficial eruption localized to left eyelid and nose on 9/14. Still no changes in vision, painful EOM, or proptosis.  Patient had noted more swelling of the eyelids so a CT of the orbits was performed on 9/15 which did not show any evidence for orbital involvement or any abscess.  Patient reassured.  Continue with ointment to the eyelid. Eyelid swelling appears to be improving.   Contact dermatitis Possible heat rash superiorly to left knee. Pruritic without evidence of infection/fluid collection. Betadine irritation is possible though less likely as there are areas with betadine without irritation. Triamcinolone topically.  Rash has improved significantly.  Normocytic anemia Hemoglobin stable.  No evidence of overt blood loss.   Polysubstance abuse UDS +amphetamine, THC. Cessation counseling provided.  Precludes discharge with PICC line in place.   Tobacco use:  Cessation counseling provided   Hyponatremia Resolved   Hypokalemia Supplemented.   Urinary hesitancy UA negative. Symptoms currently resolved.   DVT Prophylaxis: Lovenox Code Status: Full code Family Communication: Discussed with patient Disposition Plan: Will stay in the hospital till completion of antibiotic therapy (end date: 03/31/22)  Status is: Inpatient Remains inpatient appropriate because: MRSA bacteremia      Medications: Scheduled:  celecoxib  100 mg Oral BID   docusate sodium  100 mg Oral BID   enoxaparin (LOVENOX) injection  40 mg Subcutaneous Q24H   gabapentin  100 mg Oral TID   mupirocin ointment   Topical BID   sodium chloride flush  3 mL Intravenous Q12H   triamcinolone cream   Topical TID   Continuous:  DAPTOmycin (CUBICIN) 600 mg in sodium chloride 0.9 % IVPB Stopped (  03/02/22 2104)   [id number], bisacodyl, hydrALAZINE, methocarbamol, ondansetron **OR**  ondansetron (ZOFRAN) IV, oxyCODONE, polyethylene glycol  Antibiotics: Anti-infectives (From admission, onward)    Start     Dose/Rate Route Frequency Ordered Stop   02/19/22 2000  DAPTOmycin (CUBICIN) 600 mg in sodium chloride 0.9 % IVPB        8 mg/kg  77.1 kg 124 mL/hr over 30 Minutes Intravenous Daily 02/19/22 0931 03/31/22 2359   02/19/22 0600  ceFAZolin (ANCEF) IVPB 2g/100 mL premix        2 g 200 mL/hr over 30 Minutes Intravenous On call to O.R. 02/18/22 1146 02/19/22 0551   02/16/22 2200  vancomycin (VANCOREADY) IVPB 1500 mg/300 mL        1,500 mg 150 mL/hr over 120 Minutes Intravenous Every 12 hours 02/16/22 1123 02/19/22 1200   02/15/22 1730  vancomycin (VANCOCIN) IVPB 1000 mg/200 mL premix  Status:  Discontinued        1,000 mg 200 mL/hr over 60 Minutes Intravenous Every 12 hours 02/15/22 0658 02/15/22 0658   02/15/22 1730  vancomycin (VANCOCIN) IVPB 1000 mg/200 mL premix  Status:  Discontinued        1,000 mg 200 mL/hr over 60 Minutes Intravenous Every 12 hours 02/15/22 0658 02/16/22 1123   02/15/22 0445  vancomycin (VANCOREADY) IVPB 1500 mg/300 mL        1,500 mg 150 mL/hr over 120 Minutes Intravenous  Once 02/15/22 0442 02/15/22 0836       Objective:  Vital Signs  Vitals:   03/02/22 1920 03/02/22 1920 03/02/22 2000 03/03/22 0418  BP: (!) 130/104 (!) 130/104  112/80  Pulse: (!) 111 (!) 111 90 79  Resp: 20   17  Temp: 98.6 F (37 C) 98.6 F (37 C)  98 F (36.7 C)  TempSrc:      SpO2:  100%  99%  Weight:      Height:        Intake/Output Summary (Last 24 hours) at 03/03/2022 0823 Last data filed at 03/02/2022 1448 Gross per 24 hour  Intake 1080 ml  Output --  Net 1080 ml    Filed Weights   02/15/22 0454 02/17/22 1715  Weight: 77.1 kg 77.1 kg   Patient is awake alert. Left eyelid swelling is improving.   He is ambulating in the room.    Lab Results:  Data Reviewed: I have personally reviewed following labs and reports of the imaging  studies  CBC: Recent Labs  Lab 02/28/22 0113  WBC 10.2  HGB 11.6*  HCT 35.1*  MCV 98.0  PLT 578*     Basic Metabolic Panel: Recent Labs  Lab 02/28/22 0113  NA 139  K 4.0  CL 105  CO2 26  GLUCOSE 97  BUN 19  CREATININE 0.98  CALCIUM 8.7*     GFR: Estimated Creatinine Clearance: 103.8 mL/min (by C-G formula based on SCr of 0.98 mg/dL).  Cardiac Enzymes: Recent Labs  Lab 02/26/22 0729  CKTOTAL 44*      No results found for this or any previous visit (from the past 240 hour(s)).     Radiology Studies: No results found.     LOS: 16 days   Little Ishikawa  [facility] Pager on [email or web address]  03/03/2022, 8:23 AM

## 2022-03-04 ENCOUNTER — Other Ambulatory Visit (HOSPITAL_COMMUNITY): Payer: Self-pay

## 2022-03-04 DIAGNOSIS — M00062 Staphylococcal arthritis, left knee: Secondary | ICD-10-CM

## 2022-03-04 NOTE — Progress Notes (Signed)
[facility]  Date of Admission:  02/14/2022   Total days of inpatient antibiotics 8  Principal Problem:   MRSA bacteremia Active Problems:   Polysubstance abuse (HCC)   Cellulitis   Infection of skin due to methicillin resistant Staphylococcus aureus (MRSA)   Tobacco dependence   Septic arthritis of knee, left (HCC)   Abscess of forehead          Assessment: 46 year old male with history of methamphetamine use with skin picking, recently incarcerated, prior history of right finger infection(7/23) status post I&D with cultures growing MRSA treated with vancomycin inpatient and discharged on doxycycline x1 week admitted with MRSA bacteremia.  #Disseminated MRSA bacteremia involving  left knee, forehead abscess, Left eyelid #Methamphetamine use -  Given his history of recent drug use, patient is not a candidate for home IV antibiotics. - TTE and tee (9/11) showed  no vegetation - 9/8 Bcx + MRSA, 9/10NG -  Left knee SP I&D with Dr. Everardo Pacific with Cx+ MRSA on 9/10. Per OR note the purulent material did not enter joint and felt more c/w cellulitis than septic knee.  - Forehead abscess status post I&D with plastic surgery, Cx+ MRSA on 9/11(bedside)   I reviewed previous ID plan with end of 6 weeks iv abx on 10/22 I reviewed current data (POET trial for endocarditis) and oviva trial for bone and joint and also long acting glycopeptide 2 high dose shots for isolated bone/joint infection with staph aureus  He has 1 set of positive bcx that sterilized quickly and negative tee and no other focal bone joint infectio nor deep seated abscess. He has not had more than 2 weeks iv abx  He would be appropriate to transition to  1) 2 more weeks linezolid then 2 more weeks doxycycline, or 2) 2 weekly doses of high-dose long acting glycopeptides.   These would be equivalent to keeping him here to finish 6 weeks daily iv abx  Given risk benefit I think the 2 weekly  high dose glycopeptide would be better  Recommendation: -if ok from ortho standpoint, then ok to discharge from id standpoint -on the day of discharge can get one high dose oritavancin (1200 mg) then followed by outpatient dalbavancin 1500 mg 7-10 days later -I would also see patient in around 4-6 weeks in follow up. I have arranged video visit with him as he lives in [place] -ID will sign off -discussed with primary team    I spent more than 50 minute reviewing data/chart, and coordinating care and >50% direct face to face time providing counseling/discussing diagnostics/treatment plan with patient    Microbiology:   Antibiotics: Vancomycin 9/7-9/12 Daptomycin 9/12-  Cultures: Blood 9/8 2/2 MRSA 9/10 NG    SUBJECTIVE: Doing well, interested in going home if possible No n/v/diarrhea   Review of Systems: Review of Systems  All other systems reviewed and are negative.    Scheduled Meds:  celecoxib  100 mg Oral BID   docusate sodium  100 mg Oral BID   enoxaparin (LOVENOX) injection  40 mg Subcutaneous Q24H   gabapentin  100 mg Oral TID   mupirocin ointment   Topical BID   sodium chloride flush  3 mL Intravenous Q12H   triamcinolone cream   Topical TID   Continuous Infusions:  DAPTOmycin (CUBICIN) 600 mg in sodium chloride 0.9 % IVPB Stopped (03/03/22 2100)   PRN Meds:.acetaminophen, bisacodyl, hydrALAZINE, methocarbamol, ondansetron **OR** ondansetron (ZOFRAN) IV, mouth rinse,  oxyCODONE, polyethylene glycol No Known Allergies  OBJECTIVE: Vitals:   03/02/22 2000 03/03/22 0418 03/04/22 0412 03/04/22 0818  BP:  112/80 (!) 122/92 (!) 126/94  Pulse: 90 79 87 82  Resp:  17 17 18   Temp:  98 F (36.7 C) 98.3 F (36.8 C) 97.7 F (36.5 C)  TempSrc:      SpO2:  99% 98% 99%  Weight:      Height:       Body mass index is 23.05 kg/m.  Exam: General/constitutional: no distress, pleasant HEENT: Normocephalic, PER, Conj Clear, EOMI, Oropharynx clear Neck  supple CV: rrr no mrg Lungs: clear to auscultation, normal respiratory effort Abd: Soft, Nontender Ext: no edema Skin: No Rash Neuro: nonfocal       Lab Results Lab Results  Component Value Date   WBC 10.2 02/28/2022   HGB 11.6 (L) 02/28/2022   HCT 35.1 (L) 02/28/2022   MCV 98.0 02/28/2022   PLT 578 (H) 02/28/2022    Lab Results  Component Value Date   CREATININE 0.98 02/28/2022   BUN 19 02/28/2022   NA 139 02/28/2022   K 4.0 02/28/2022   CL 105 02/28/2022   CO2 26 02/28/2022    Lab Results  Component Value Date   ALT 36 02/24/2022   AST 26 02/24/2022   ALKPHOS 67 02/24/2022   BILITOT <0.1 (L) 02/24/2022        Jabier Mutton, [facility] 03/04/2022, 3:16 PM

## 2022-03-04 NOTE — TOC Initial Note (Signed)
Transition of Care [facility]) - Initial/Assessment Note    Patient Details  Name: Eric Woods MRN: [id number] Date of Birth: 02/19/1976  Transition [facility] Care [facility]) CM/SW Contact:    Eric Crews, RN Phone Number: [phone] 03/04/2022, 4:05 PM  Clinical Narrative:                  Spoke with patient, mom, and SO at the bedside to discuss post acute transition when medically stable. Patient currently lives in [place] with SO, while mom lives in [place], [place]. Patient is uninsured and Development worker, community is following for financial assistance. Patient receiving wound care to L knee and IV antibiotics.   Expected Discharge Plan: Home/Self Care Barriers to Discharge: Continued Medical Work up   Patient Goals and CMS Choice Patient states their goals for this hospitalization and ongoing recovery are:: wants to knee to heal CMS Medicare.gov Compare Post Acute Care list provided to:: Patient Choice offered to / list presented to : NA  Expected Discharge Plan and Services Expected Discharge Plan: Home/Self Care In-house Referral: Clinical Social Work Discharge Planning Services: CM Consult                     DME Arranged: N/A DME Agency: NA       HH Arranged: NA HH Agency: NA        Prior Living Arrangements/Services   Lives with:: Self, Significant Other Patient language and need for interpreter reviewed:: Yes        Need for Family Participation in Patient Care: No (Comment)     Criminal Activity/Legal Involvement Pertinent to Current Situation/Hospitalization: No - Comment as needed  Activities of Daily Living Home Assistive Devices/Equipment: None ADL Screening (condition at time of admission) Patient's cognitive ability adequate to safely complete daily activities?: Yes Is the patient deaf or have difficulty hearing?: No Does the patient have difficulty seeing, even when wearing glasses/contacts?: No Does the patient have difficulty concentrating, remembering, or  making decisions?: No Patient able to express need for assistance with ADLs?: Yes Does the patient have difficulty dressing or bathing?: No Independently performs ADLs?: Yes (appropriate for developmental age) Does the patient have difficulty walking or climbing stairs?: No Weakness of Legs: Left Weakness of Arms/Hands: None  Permission Sought/Granted                  Emotional Assessment Appearance:: Appears stated age Attitude/Demeanor/Rapport: Engaged Affect (typically observed): Accepting Orientation: : Oriented to Self, Oriented to Place, Oriented to  Time, Oriented to Situation Alcohol / Substance Use: Illicit Drugs    Admission diagnosis:  Cellulitis of face [L03.211] Cellulitis [L03.90] Cellulitis of left lower extremity [L03.116] Substance use disorder [F19.90] 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] Patient Active Problem List   Diagnosis Date Noted   MRSA bacteremia 02/18/2022   Septic arthritis of knee, left ([place]) 02/18/2022   Abscess of forehead 02/18/2022   Cellulitis 02/15/2022   Infection of skin due to methicillin resistant Staphylococcus aureus (MRSA) 02/15/2022   Tobacco dependence 02/15/2022   Polysubstance abuse ([place]) 12/20/2021   Transaminitis 12/20/2021   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   PCP:  Patient, No Pcp Per Pharmacy:   [facility] [place] - [address] [place] [address] Phone: [phone] Fax: [phone]  [facility] [address] [address] Phone: [phone] Fax: [phone]     Social Determinants of Health (SDOH) Interventions    Readmission  Risk Interventions     No data to display

## 2022-03-04 NOTE — TOC Progression Note (Signed)
Transition of Care [facility]) - Progression Note    Patient Details  Name: Eric Woods MRN: [id number] Date of Birth: [birth year]  Transition of Care [facility]) CM/SW Contact  Bartholomew Crews, RN Phone Number: [phone] 03/04/2022, 3:44 PM  Clinical Narrative:     Patient followed by financial counselor - reached out to financial counselor to request financial assistance to include outpatient infusion needs at short stay. Response pending.        Expected Discharge Plan and Services                                                 Social Determinants of Health (SDOH) Interventions    Readmission Risk Interventions     No data to display

## 2022-03-04 NOTE — Plan of Care (Signed)

## 2022-03-04 NOTE — Progress Notes (Addendum)
[facility] PROGRESS NOTE   Eric Woods FYB:[id number] DOB: 11/21/1975 DOA: 02/14/2022  PCP: Patient, No Pcp Per  Brief History/Interval Summary: 46 y.o. male with a history of methamphetamine use, incarceration, skin picking behavior, recurrent MRSA infections who presented with left knee pain and several areas of skin and soft tissue infections. Broad IV antibiotics were started, narrowed to vancomycin and the patient was admitted. Orthopedics consulted, performed bedside left knee I&D 9/8. Blood cultures have grown MRSA. TTE and then subsequent TEE without vegetation. Left knee washout in OR performed 9/10 noting deep abscess without joint involvement. Also underwent bedside I&D of left forehead abscess by plastics on 9/11. Continues on IV antibiotics, vancomycin switched to daptomycin on 9/12. Repeat blood cultures 9/10 are NGTD.  Consultants: Infectious disease.  Orthopedics.  Plastic surgery.  Phone discussion with ophthalmology.  **Afternoon update** Discussed with ID and possibility he will be able to qualify for weekly oritavancin and discharge sooner than October 22 which was the previous DC date.  Subjective/Interval History: No acute issues or events overnight.  Assessment/Plan:  Multifocal SSTI, MRSA bacteremia No vegetation on TEE. Continue IV antibiotics for 6 weeks per ID.  Vancomycin was switched over to daptomycin for ease of dosing.  Pharmacy managing.  CK levels being followed weekly.  Last day of antibiotics is 10/22. Has had previous hospitalization for MRSA infection and patient is "unsure" whether he took oral doxycycline after discharge.  Repeat blood cultures 9/10 sent, NGTD HIV NR July 2023, RPR NR, HCV and HPV SAg negative/NR. Patient remains stable.  WBC is improving.  He remains afebrile.   Left knee cellulitis and abscess Orthopedics, Dr. Griffin Basil, performed operative I&D 9/10. Cultures MRSA. Distal LLE swelling improving, negative venous U/S for  DVT.   Wound care as per orthopedics.  Seen by orthopedics yesterday.   Left forehead cellulitis and abscess S/p bedside I&D 9/11 by plastics with 5cc purulent discharge collected. Culture +MRSA.   Left preseptal cellulitis Was discussed with ophthalmology, Dr. Posey Pronto on 9/9. Developing more superficial eruption localized to left eyelid and nose on 9/14. Still no changes in vision, painful EOM, or proptosis.  Patient had noted more swelling of the eyelids so a CT of the orbits was performed on 9/15 which did not show any evidence for orbital involvement or any abscess.  Patient reassured.  Continue with ointment to the eyelid. Eyelid swelling appears to be improving.   Contact dermatitis Possible heat rash superiorly to left knee. Pruritic without evidence of infection/fluid collection. Betadine irritation is possible though less likely as there are areas with betadine without irritation. Triamcinolone topically.  Rash has improved significantly.  Normocytic anemia Hemoglobin stable.  No evidence of overt blood loss.   Polysubstance abuse UDS +amphetamine, THC. Cessation counseling provided.  Precludes discharge with PICC line in place.   Tobacco use:  Cessation counseling provided   Hyponatremia Resolved   Hypokalemia Supplemented.   Urinary hesitancy UA negative. Symptoms currently resolved.   DVT Prophylaxis: Lovenox Code Status: Full code Family Communication: Discussed with patient Disposition Plan: Will stay in the hospital till completion of antibiotic therapy (end date: 03/31/22)  Status is: Inpatient Remains inpatient appropriate because: MRSA bacteremia      Medications: Scheduled:  celecoxib  100 mg Oral BID   docusate sodium  100 mg Oral BID   enoxaparin (LOVENOX) injection  40 mg Subcutaneous Q24H   gabapentin  100 mg Oral TID   mupirocin ointment   Topical BID   sodium chloride flush  3 mL Intravenous Q12H   triamcinolone cream   Topical TID    Continuous:  DAPTOmycin (CUBICIN) 600 mg in sodium chloride 0.9 % IVPB Stopped (03/03/22 2100)   IOE:[id number], bisacodyl, hydrALAZINE, methocarbamol, ondansetron **OR** ondansetron (ZOFRAN) IV, mouth rinse, oxyCODONE, polyethylene glycol  Antibiotics: Anti-infectives (From admission, onward)    Start     Dose/Rate Route Frequency Ordered Stop   02/19/22 2000  DAPTOmycin (CUBICIN) 600 mg in sodium chloride 0.9 % IVPB        8 mg/kg  77.1 kg 124 mL/hr over 30 Minutes Intravenous Daily 02/19/22 0931 03/31/22 2359   02/19/22 0600  ceFAZolin (ANCEF) IVPB 2g/100 mL premix        2 g 200 mL/hr over 30 Minutes Intravenous On call to O.R. 02/18/22 1146 02/19/22 0551   02/16/22 2200  vancomycin (VANCOREADY) IVPB 1500 mg/300 mL        1,500 mg 150 mL/hr over 120 Minutes Intravenous Every 12 hours 02/16/22 1123 02/19/22 1200   02/15/22 1730  vancomycin (VANCOCIN) IVPB 1000 mg/200 mL premix  Status:  Discontinued        1,000 mg 200 mL/hr over 60 Minutes Intravenous Every 12 hours 02/15/22 0658 02/15/22 0658   02/15/22 1730  vancomycin (VANCOCIN) IVPB 1000 mg/200 mL premix  Status:  Discontinued        1,000 mg 200 mL/hr over 60 Minutes Intravenous Every 12 hours 02/15/22 0658 02/16/22 1123   02/15/22 0445  vancomycin (VANCOREADY) IVPB 1500 mg/300 mL        1,500 mg 150 mL/hr over 120 Minutes Intravenous  Once 02/15/22 0442 02/15/22 0836       Objective:  Vital Signs  Vitals:   03/02/22 1920 03/02/22 2000 03/03/22 0418 03/04/22 0412  BP: (!) 130/104  112/80 (!) 122/92  Pulse: (!) 111 90 79 87  Resp:   17 17  Temp: 98.6 F (37 C)  98 F (36.7 C) 98.3 F (36.8 C)  TempSrc:      SpO2: 100%  99% 98%  Weight:      Height:       No intake or output data in the 24 hours ending 03/04/22 0806  Filed Weights   02/15/22 0454 02/17/22 1715  Weight: 77.1 kg 77.1 kg   Patient is awake alert. Left eyelid swelling is improving.   He is ambulating in the room.    Lab  Results:  Data Reviewed: I have personally reviewed following labs and reports of the imaging studies  CBC: Recent Labs  Lab 02/28/22 0113  WBC 10.2  HGB 11.6*  HCT 35.1*  MCV 98.0  PLT 578*     Basic Metabolic Panel: Recent Labs  Lab 02/28/22 0113  NA 139  K 4.0  CL 105  CO2 26  GLUCOSE 97  BUN 19  CREATININE 0.98  CALCIUM 8.7*     GFR: Estimated Creatinine Clearance: 103.8 mL/min (by C-G formula based on SCr of 0.98 mg/dL).  Cardiac Enzymes: Recent Labs  Lab 02/26/22 0729  CKTOTAL 44*      No results found for this or any previous visit (from the past 240 hour(s)).     Radiology Studies: No results found.     LOS: 17 days   Azucena Fallen  [facility] Pager on [email or web address]  03/04/2022, 8:06 AM

## 2022-03-05 ENCOUNTER — Encounter (HOSPITAL_COMMUNITY): Payer: Self-pay | Admitting: Internal Medicine

## 2022-03-05 ENCOUNTER — Other Ambulatory Visit (HOSPITAL_COMMUNITY): Payer: Self-pay

## 2022-03-05 ENCOUNTER — Ambulatory Visit (INDEPENDENT_AMBULATORY_CARE_PROVIDER_SITE_OTHER): Payer: Medicaid Other | Admitting: Primary Care

## 2022-03-05 LAB — BASIC METABOLIC PANEL
Anion gap: 3 — ABNORMAL LOW (ref 5–15)
BUN: 14 mg/dL (ref 6–20)
CO2: 27 mmol/L (ref 22–32)
Calcium: 8.7 mg/dL — ABNORMAL LOW (ref 8.9–10.3)
Chloride: 108 mmol/L (ref 98–111)
Creatinine, Ser: 0.82 mg/dL (ref 0.61–1.24)
GFR, Estimated: >60 mL/min (ref >60)
Glucose, Bld: 97 mg/dL (ref 70–99)
Potassium: 4.2 mmol/L (ref 3.5–5.1)
Sodium: 138 mmol/L (ref 135–145)

## 2022-03-05 LAB — CK: Total CK: 114 U/L (ref 49–397)

## 2022-03-05 MED ORDER — MUPIROCIN 2 % EX OINT: TOPICAL_OINTMENT | Freq: Two times a day (BID) | CUTANEOUS | 0 refills | Status: DC

## 2022-03-05 MED ORDER — TRIAMCINOLONE ACETONIDE 0.1 % EX CREA
TOPICAL_CREAM | Freq: Three times a day (TID) | CUTANEOUS | 1 refills | Status: DC
  Filled 2022-03-05: qty 30, 30d supply, fill #0

## 2022-03-05 MED ORDER — MUPIROCIN 2 % EX OINT
TOPICAL_OINTMENT | Freq: Two times a day (BID) | CUTANEOUS | 1 refills | Status: DC
  Filled 2022-03-05: qty 22, 30d supply, fill #0

## 2022-03-05 MED ORDER — TRAMADOL HCL 50 MG PO TABS: 50.0000 mg | ORAL_TABLET | Freq: Four times a day (QID) | ORAL | 0 refills | Status: DC | PRN

## 2022-03-05 MED ORDER — CELECOXIB 100 MG PO CAPS: 100.0000 mg | ORAL_CAPSULE | Freq: Two times a day (BID) | ORAL | 0 refills | Status: DC

## 2022-03-05 MED ORDER — TRIAMCINOLONE ACETONIDE 0.1 % EX CREA: TOPICAL_CREAM | Freq: Three times a day (TID) | CUTANEOUS | 0 refills | Status: DC

## 2022-03-05 MED ORDER — GABAPENTIN 100 MG PO CAPS
100.0000 mg | ORAL_CAPSULE | Freq: Three times a day (TID) | ORAL | 1 refills | Status: DC
  Filled 2022-03-05: qty 90, 30d supply, fill #0

## 2022-03-05 MED ORDER — CELECOXIB 100 MG PO CAPS
100.0000 mg | ORAL_CAPSULE | Freq: Two times a day (BID) | ORAL | 0 refills | Status: DC
  Filled 2022-03-05: qty 30, 15d supply, fill #0

## 2022-03-05 MED ORDER — TRAMADOL HCL 50 MG PO TABS
50.0000 mg | ORAL_TABLET | Freq: Four times a day (QID) | ORAL | 0 refills | Status: DC | PRN
  Filled 2022-03-05: qty 20, 5d supply, fill #0

## 2022-03-05 MED ORDER — ORITAVANCIN DIPHOSPHATE 400 MG IV SOLR
1200.0000 mg | Freq: Once | INTRAVENOUS | Status: AC
  Administered 2022-03-05: 1200 mg via INTRAVENOUS
  Filled 2022-03-05: qty 120

## 2022-03-05 MED ORDER — GABAPENTIN 100 MG PO CAPS: 100.0000 mg | ORAL_CAPSULE | Freq: Three times a day (TID) | ORAL | Status: DC

## 2022-03-05 NOTE — TOC Progression Note (Signed)
Transition of Care [facility]) - Progression Note    Patient Details  Name: Javarian Jakubiak MRN: [id number] Date of Birth: 05/03/1976  Transition [facility] Care [facility]) CM/SW Contact  Sharin Mons, RN Phone Number: 03/05/2022, 10:17 AM  Clinical Narrative:    NCM sent email to [facility]. regarding financial assist for outpatient short stay services, pt uninsured. NCM shared MD ready to d/c. Requested F.C call NCM to discuss case.  Per email received from [facility]. noted 9/25 : If approved for financial assistance he will be able to carry over to outpatient as long as it is a [place] facility. If and when approved he will be able to use it for 6 months. NCM unsure how long approval process takes , awaiting call from [place] team following for needs...   Expected Discharge Plan: Home/Self Care Barriers to Discharge: Other (must enter comment)  Expected Discharge Plan and Services Expected Discharge Plan: Home/Self Care In-house Referral: Clinical Social Work Discharge Planning Services: CM Consult                     DME Arranged: N/A DME Agency: NA       HH Arranged: NA HH Agency: NA         Social Determinants of Health (SDOH) Interventions    Readmission Risk Interventions     No data to display

## 2022-03-05 NOTE — TOC Transition Note (Signed)
Transition of Care [facility]) - CM/SW Discharge Note   Patient Details  Name: Eric Woods MRN: [id number] Date of Birth: May 31, 1976  Transition of Care [facility]) CM/SW Contact:  Sharin Mons, RN Phone Number: 03/05/2022, 1:45 PM   Clinical Narrative:    Patient will DC to: home with girlfriend, Jerene Pitch Anticipated DC date: 03/05/2022 Family notified: yes Transport by: car   Per MD patient ready for DC today . RN, patient, patient's family ( mom/girlfriend), and aware of DC. Pt will transition to girlfriend's residence once d/c:  [address] [place], [place] [address]. Pt will d/c with LE wound.Nurse to provide teaching and provide pt with wound care supply. Girlfriend to assist with care. Post hospital f/u appointment noted on AVS. Pt without PCP. NCM shared [place]. Pt interested. Hospital f/u arranged and noted on AVS. Girlfriend states will provide transportation to MD appointments. Pt without transportation issues. No  DME needs noted. Rx meds will be delivered by [facility] pharmacy to bedside prior to d/c.    Lendon Collar ([phone]  RNCM will sign off for now as intervention is no longer needed. Please consult [place] again if new needs arise.    Final next level of care: Home/Self Care Barriers to Discharge: No Barriers Identified   Patient Goals and CMS Choice Patient states their goals for this hospitalization and ongoing recovery are:: wants to knee to heal CMS Medicare.gov Compare Post Acute Care list provided to:: Patient Choice offered to / list presented to : NA  Discharge Placement                       Discharge Plan and Services In-house Referral: Clinical Social Work Discharge Planning Services: CM Consult            DME Arranged: N/A DME Agency: NA       HH Arranged: NA HH Agency: NA        Social Determinants of Health (SDOH) Interventions     Readmission Risk Interventions     No data to display

## 2022-03-05 NOTE — Progress Notes (Signed)
   ORTHOPAEDIC PROGRESS NOTE  s/p Procedure(s):  Left knee incision and debridement of deep abscess Left knee prepatellar bursectomy on 02/17/2022  SUBJECTIVE: Patient continues to feel better each day. Continues to have a burning pain. He is planning to be discharged home today. He has been doing exercises in his room while inpatient.    OBJECTIVE: PE:   Left lower extremity: erythema has improved significantly. The remainder of the packing was removed today. Sutures were removed as well. Granulation tissue noted. Nontender elsewhere of the left lower leg. No edema of the left lower leg. NVI  Vitals:   03/05/22 0355 03/05/22 0800  BP: (!) 130/94 121/84  Pulse: 72 93  Resp: 17 18  Temp: 98.2 F (36.8 C) 99.2 F (37.3 C)  SpO2: 98% 100%   Cultures showing MRSA  ASSESSMENT: Eric Woods is a 46 y.o. male POD#16  PLAN: Weightbearing: WBAT LLE Insicional and dressing care: Packing was removed today. Sutures were removed as well. Discussed wound care with the patient at length and the importance of daily dressing changes. We discussed the importance of keeping clean and dry.  Orthopedic device(s): None Showering: Keep left knee wound covered when showering. Keep clean and dry.  VTE prophylaxis: per primary team, no orthopedic contraindications Pain control: per primary team. He should start to wean off narcotics at this point. I added Tylenol and Gabapentin to his current regimen. Continue Celebrex and Robaxin.  Follow - up plan: 1 week in office for wound check Dispo: patient discharging home today Contact information:  Dr. Ophelia Charter, Noemi Chapel, PA-C, After hours and holidays please check [email or web address] for group call information for [facility]  Wound care discussed at length with the patient. He states his understanding of this.    Noemi Chapel, PA-C 03/05/2022

## 2022-03-05 NOTE — Discharge Instructions (Addendum)
Wound Packing  Wound packing usually involves placing a moistened packing material into your wound and then covering it with an outer bandage (dressing). This helps support the healing of deep tissue and tissue under the skin. It also helps prevent bleeding, infection, and further injury.  Wounds are packed until deep tissues heal. The time it takes for this to happen is different for everyone. Your health care provider will show you how to pack and dress your wound. Using gloves and a clean technique is important to avoid spreading germs into your wound. Supplies needed: Soap and water. Disposable gloves. Cleansing or wetting solution, such as saline, germ-free (sterile) water, or an antiseptic solution. Clean bowl. Clean packing material, such as gauze, gauze sponges, or rolled gauze. Clean paper towels. Outer dressing. This includes the cover dressing and tape, or a dressing with an adhesive border. Cotton-tipped swabs. Small plastic bag for trash.   Removing the old packing material and dressing Put on a set of gloves. Gently remove the old dressing and packing material - approximately 3 inches of packing daily.  Clean or rinse (irrigate) the wound. Remove your gloves. Put the removed items, including gloves, into the plastic bag to throw away later. Wash your hands again with soap and water for at least 20 seconds. If soap and water are not available, use hand sanitizer.  General tips Follow your health care provider's instructions on how much to pack the wound. At first, you may need to pack it more fully to help stop bleeding. As the wound begins to heal inside, you will use less packing material and pack the wound loosely to allow the tissue to heal slowly from the inside out. Do not take baths, swim, or use a hot tub until your health care provider approves. Ask your health care provider if you may take showers. You may only be allowed to take sponge baths. Keep the dressing  clean and dry. Follow any other instructions given by your health care provider on how to aid healing. This may include applying warm or cold compresses, raising (elevating) the affected area, or wearing a compression dressing. Check your wound site every day for signs of infection. Check for: More redness, swelling, or pain. More fluid or blood. Warmth or hardness (induration). Pus or a bad smell. Protect your wound from the sun when you are outside for the first 6 months, or for as long as told by your health care provider. Cover up the scar area or apply sunscreen that has an SPF of at least 58. Keep all follow-up visits. This is important. Contact a health care provider if: Your pain is not controlled with pain medicine. You have more drainage, redness, swelling, or pain at your wound site. You have new rash, warmth, or induration around the wound. You have a fever or chills. Your wound becomes larger or deeper. Get help right away if: The tissue inside your wound changes color from pink to white, yellow, or black. You notice a bad smell or pus coming from the wound site. You are having trouble packing your wound. Your wound is bleeding, and the bleeding does not stop with gentle pressure.  These symptoms may represent a serious problem that is an emergency. Do not wait to see if the symptoms will go away. Get medical help right away. Call your local emergency services (911 in the [place] Do not drive yourself to the hospital. Summary Wound packing usually involves placing a moistened packing material into your  wound and then covering it with an outer bandage (dressing). Follow your health care provider's instructions on how often you need to change dressings and pack your wound. You will likely be asked to change dressings 1 to 2 times a day. When packing your wound, it is important to use gloves to avoid spreading germs into the wound. Check your wound site every day for signs of  infection. This information is not intended to replace advice given to you by your health care provider. Make sure you discuss any questions you have with your health care provider. Document Revised: 10/03/2020 Document Reviewed: 10/03/2020  Discharge Wound Care Instructions  Do NOT apply any ointments, solutions or lotions to pin sites or surgical wounds.  These prevent needed drainage and even though solutions like hydrogen peroxide kill bacteria, they also damage cells lining the pin sites that help fight infection.  Applying lotions or ointments can keep the wounds moist and can cause them to breakdown and open up as well. This can increase the risk for infection. When in doubt call the office.  Surgical incisions should be dressed daily. (Even once the packing has been removed)   If you have a wet to dry dressing: wet the gauze with saline the squeeze as much saline out so the gauze is moist (not soaking wet), place moistened gauze over wound, then place a dry gauze over the moist one, followed by Kerlix wrap, then ace wrap.

## 2022-03-05 NOTE — Discharge Summary (Signed)
Physician Discharge Summary  Eric RangeJames Mckeithan ZOX:[id number] DOB: 06/30/1975 DOA: 02/14/2022  PCP: Patient, No Pcp Per  Admit date: 02/14/2022 Discharge date: 03/05/2022  Admitted From: Home Disposition:  Home  Recommendations for Outpatient Follow-up:  Follow up with PCP in 1-2 weeks Follow up with ID for ongoing injections  Discharge Condition:Stable  CODE STATUS:Full  Diet recommendation: As tolerated    Brief/Interim Summary: 46 y.o. male with a history of methamphetamine use, incarceration, skin picking behavior, recurrent MRSA infections who presented with left knee pain and several areas of skin and soft tissue infections. Broad IV antibiotics were started, narrowed to vancomycin and the patient was admitted. Orthopedics consulted, performed bedside left knee I&D 9/8. Blood cultures have grown MRSA. TTE and then subsequent TEE without vegetation. Left knee washout in OR performed 9/10 noting deep abscess without joint involvement. Also underwent bedside I&D of left forehead abscess by plastics on 9/11. Continues on IV antibiotics, vancomycin switched to daptomycin on 9/12. Infection improving - ID following - will transition to long acting oritavancin here today and continue weekly in the outpatient setting. Otherwise stable and agreeable for discharge back home.  Discharge Diagnoses:  Principal Problem:   MRSA bacteremia Active Problems:   Polysubstance abuse (HCC)   Cellulitis   Infection of skin due to methicillin resistant Staphylococcus aureus (MRSA)   Tobacco dependence   Septic arthritis of knee, left (HCC)   Abscess of forehead  Discharge Instructions  Discharge Instructions     Call MD for:  persistant nausea and vomiting   Complete by: As directed    Call MD for:  redness, tenderness, or signs of infection (pain, swelling, redness, odor or green/yellow discharge around incision site)   Complete by: As directed    Call MD for:  severe uncontrolled pain   Complete by:  As directed    Call MD for:  temperature >100.4   Complete by: As directed    Diet - low sodium heart healthy   Complete by: As directed    Discharge instructions   Complete by: As directed    Follow-up with infectious disease as scheduled weekly for your antibiotic injection.  Follow-up with orthopedic surgery for ongoing wound care and evaluation as scheduled.   Discharge wound care:   Complete by: As directed    Per orthopedic recommendations   Increase activity slowly   Complete by: As directed       Allergies as of 03/05/2022   No Known Allergies      Medication List     STOP taking these medications    doxycycline 100 MG tablet Commonly known as: VIBRA-TABS       TAKE these medications    acetaminophen 325 MG tablet Commonly known as: TYLENOL Take 2 tablets (650 mg total) by mouth every 6 (six) hours as needed for mild pain or moderate pain.   celecoxib 100 MG capsule Commonly known as: CELEBREX Take 1 capsule (100 mg total) by mouth 2 (two) times daily.   gabapentin 100 MG capsule Commonly known as: NEURONTIN Take 1 capsule (100 mg total) by mouth 3 (three) times daily.   mupirocin ointment 2 % Commonly known as: BACTROBAN Apply topically 2 (two) times daily.   traMADol 50 MG tablet Commonly known as: Ultram Take 1 tablet (50 mg total) by mouth every 6 (six) hours as needed.   triamcinolone cream 0.1 % Commonly known as: KENALOG Apply topically 3 (three) times daily.  Discharge Care Instructions  (From admission, onward)           Start     Ordered   03/05/22 0000  Discharge wound care:       Comments: Per orthopedic recommendations   03/05/22 1342            Follow-up Information      [facility] Follow up on 03/31/2022.   Why: Post hospital follow up and to establish primary care sceduled for 10/22.2023 at 2:30 pm with Dr.Johnson Contact information: [address] [place] [address] [phone]        [place]etta [place], [place]. Schedule an appointment as soon as possible for a visit on 03/12/2022.   Specialty: Orthopedic Surgery Why: For wound re-check Contact information: [address] [place] [address] [phone]                No Known Allergies  Consultations: ID, orthopedics   Procedures/Studies: CT ORBITS W CONTRAST  Result Date: 02/22/2022 CLINICAL DATA:  Orbital cellulitis suspected.  Left eye swelling. EXAM: CT ORBITS WITH CONTRAST TECHNIQUE: Multidetector CT images was performed according to the standard protocol following intravenous contrast administration. RADIATION DOSE REDUCTION: This exam was performed according to the departmental dose-optimization program which includes automated exposure control, adjustment of the mA and/or kV according to patient size and/or use of iterative reconstruction technique. CONTRAST:  70mL OMNIPAQUE IOHEXOL 350 MG/ML SOLN COMPARISON:  Head CT 06/18/2013 FINDINGS: Orbits: Mild left orbital preseptal soft tissue thickening. No evidence of postseptal inflammation or abscess. Intact globes. No orbital mass. Visible paranasal sinuses: Clear. Soft tissues: Moderate soft tissue thickening involving the left greater than right forehead, incompletely imaged. No evidence of a forehead abscess where imaged. Osseous: No fracture or destructive osseous process. Limited intracranial: Unremarkable. IMPRESSION: Left periorbital and partially visualized forehead soft tissue thickening compatible with cellulitis. No evidence of postseptal cellulitis or abscess. Electronically Signed   By: Sebastian Ache M.D.   On: 02/22/2022 16:07   VAS [place] LOWER EXTREMITY VENOUS (DVT)  Result Date: 02/18/2022  Lower Venous DVT Study Patient Name:  Eric Woods  Date of Exam:   02/18/2022 Medical Rec #: [id number]        Accession #:    [id number] Date of Birth: 1975-09-23        Patient  Gender: M Patient Age:   47 years Exam Location:  [facility] Procedure:      VAS [place] LOWER EXTREMITY VENOUS (DVT) Referring Phys: Hazeline Junker --------------------------------------------------------------------------------  Indications: Edema.  Comparison Study: NO PRIOR Performing Technologist: Argentina Ponder RVS  Examination Guidelines: A complete evaluation includes B-mode imaging, spectral Doppler, color Doppler, and power Doppler as needed of all accessible portions of each vessel. Bilateral testing is considered an integral part of a complete examination. Limited examinations for reoccurring indications may be performed as noted. The reflux portion of the exam is performed with the patient in reverse Trendelenburg.  +-----+---------------+---------+-----------+----------+--------------+ RIGHTCompressibilityPhasicitySpontaneityPropertiesThrombus Aging +-----+---------------+---------+-----------+----------+--------------+ CFV  Full           Yes      Yes                                 +-----+---------------+---------+-----------+----------+--------------+ SFJ  Full                                                        +-----+---------------+---------+-----------+----------+--------------+   +---------+---------------+---------+-----------+----------+--------------+  LEFT     CompressibilityPhasicitySpontaneityPropertiesThrombus Aging +---------+---------------+---------+-----------+----------+--------------+ CFV      Full           Yes      Yes                                 +---------+---------------+---------+-----------+----------+--------------+ SFJ      Full                                                        +---------+---------------+---------+-----------+----------+--------------+ FV Prox  Full                                                        +---------+---------------+---------+-----------+----------+--------------+ FV Mid   Full                                                         +---------+---------------+---------+-----------+----------+--------------+ FV DistalFull                                                        +---------+---------------+---------+-----------+----------+--------------+ PFV      Full                                                        +---------+---------------+---------+-----------+----------+--------------+ POP      Full           Yes      Yes                                 +---------+---------------+---------+-----------+----------+--------------+ PTV      Full                                                        +---------+---------------+---------+-----------+----------+--------------+ PERO     Full                                                        +---------+---------------+---------+-----------+----------+--------------+     Summary: RIGHT: - No evidence of common femoral vein obstruction.  LEFT: - There is no evidence of deep vein thrombosis in the lower extremity.  - No cystic structure found in the popliteal fossa.  *See table(s) above for measurements and observations.  Electronically signed by Sherald Hess MD on 02/18/2022 at 4:19:52 PM.    Final    ECHO TEE  Result Date: 02/18/2022    TRANSESOPHOGEAL ECHO REPORT   Patient Name:   Eric Woods Date of Exam: 02/18/2022 Medical Rec #:  [id number]       Height:       72.0 in Accession #:    [id number]      Weight:       170.0 lb Date of Birth:  02-24-1976       BSA:          1.988 m Patient Age:    45 years        BP:           130/86 mmHg Patient Gender: M               HR:           76 bpm. Exam Location:  Inpatient Procedure: Transesophageal Echo, Color Doppler and Cardiac Doppler Indications:     Endocarditis  History:         Patient has prior history of Echocardiogram examinations, most                  recent 02/15/2022. Risk Factors:Current Smoker and Polysubstance                  abuse.   Sonographer:     Eulah Pont RDCS Referring Phys:  [id number] Corrin Parker Diagnosing Phys: Zoila Shutter MD PROCEDURE: After discussion of the risks and benefits of a TEE, an informed consent was obtained from the patient. The transesophogeal probe was passed without difficulty through the esophogus of the patient. Sedation performed by different physician. The patient was monitored while under deep sedation. Anesthestetic sedation was provided intravenously by Anesthesiology: 231.3mg  of Propofol. The patient's vital signs; including heart rate, blood pressure, and oxygen saturation; remained stable throughout the procedure. The patient developed no complications during the procedure. IMPRESSIONS  1. Left ventricular ejection fraction, by estimation, is 60 to 65%. The left ventricle has normal function.  2. Right ventricular systolic function is normal. The right ventricular size is normal.  3. No left atrial/left atrial appendage thrombus was detected.  4. The mitral valve is grossly normal. Trivial mitral valve regurgitation.  5. The aortic valve is tricuspid. Aortic valve regurgitation is not visualized. Conclusion(s)/Recommendation(s): No evidence of vegetation/infective endocarditis on this transesophageael echocardiogram. FINDINGS  Left Ventricle: Left ventricular ejection fraction, by estimation, is 60 to 65%. The left ventricle has normal function. The left ventricular internal cavity size was normal in size. There is no left ventricular hypertrophy. Right Ventricle: The right ventricular size is normal. No increase in right ventricular wall thickness. Right ventricular systolic function is normal. Left Atrium: Left atrial size was normal in size. No left atrial/left atrial appendage thrombus was detected. Right Atrium: Right atrial size was normal in size. Pericardium: There is no evidence of pericardial effusion. Mitral Valve: The mitral valve is grossly normal. Trivial mitral valve regurgitation.  Tricuspid Valve: The tricuspid valve is grossly normal. Tricuspid valve regurgitation is trivial. Aortic Valve: The aortic valve is tricuspid. Aortic valve regurgitation is not visualized. Pulmonic Valve: The pulmonic valve was grossly normal. Pulmonic valve regurgitation is trivial. Aorta: The aortic root and ascending aorta are structurally normal, with no evidence of dilitation. IAS/Shunts: No atrial level shunt detected by color flow Doppler. Zoila Shutter MD Electronically signed by Zoila Shutter MD Signature Date/Time: 02/18/2022/11:37:13  AM    Final    MR KNEE LEFT W WO CONTRAST  Result Date: 02/17/2022 CLINICAL DATA:  Knee redness, pain, swelling. Septic arthritis suspected, knee, xray done EXAM: MRI OF THE LEFT KNEE WITHOUT AND WITH CONTRAST TECHNIQUE: Multiplanar, multisequence MR imaging of the left knee was performed both before and after administration of intravenous contrast. CONTRAST:  7.48mL GADAVIST GADOBUTROL 1 MMOL/ML IV SOLN COMPARISON:  X-ray 02/15/2022 FINDINGS: MENISCI Medial meniscus:  Intact. Lateral meniscus:  Intact. LIGAMENTS Cruciates: Intact ACL and PCL. Collaterals: Intact MCL. Lateral collateral ligament complex intact. CARTILAGE Patellofemoral: Minimal cartilage surface irregularity along the patellar apex inferiorly. No trochlear cartilage defect. Medial:  No chondral defect. Lateral:  No chondral defect. MISCELLANEOUS Joint: Small joint effusion without synovitis. Fat pads within normal limits. Popliteal Fossa:  No Baker's cyst. Intact popliteus tendon. Extensor Mechanism:  Intact quadriceps and patellar tendons. Bones: No acute fracture. No dislocation. No bone marrow edema. No periostitis. No erosion. No marrow replacing bone lesion. Other: Prepatellar soft tissue wound with underlying complex fluid collection with partial rim enhancement. Approximate measurements of 6.0 x 1.0 x 3.0 cm (series 19, image 14). Extensive subcutaneous edema and enhancement throughout the soft  tissues of the lower extremity centered at the knee anteriorly. There is deep fascial edema and enhancement. Mild intramuscular edema within the superficial aspects of the distal vastus lateralis and vastus medialis muscles. IMPRESSION: 1. Prepatellar soft tissue wound with underlying complex fluid collection with partial rim enhancement measuring approximately 6 x 1 x 3 cm compatible with phlegmon/developing abscess. 2. Extensive findings of cellulitis and myofasciitis of the imaged lower extremity. 3. Small joint effusion without synovitis, favored reactive. Septic arthritis is not entirely excluded and arthrocentesis with joint fluid analysis should be considered. 4. No evidence of osteomyelitis. 5. Intact menisci.  Intact cruciate and collateral ligaments. Electronically Signed   By: Davina Poke D.O.   On: 02/17/2022 09:09   ECHOCARDIOGRAM COMPLETE  Result Date: 02/15/2022    ECHOCARDIOGRAM REPORT   Patient Name:   Eric Woods Date of Exam: 02/15/2022 Medical Rec #:  [id number]       Height:       72.0 in Accession #:    [id number]      Weight:       170.0 lb Date of Birth:  May 27, 1976       BSA:          1.988 m Patient Age:    41 years        BP:           134/93 mmHg Patient Gender: M               HR:           102 bpm. Exam Location:  Inpatient Procedure: 2D Echo Indications:    endocarditis  History:        Patient has no prior history of Echocardiogram examinations.  Sonographer:    Johny Chess RDCS Referring Phys: [place]  1. Redundant MV chordae noted.  2. Left ventricular ejection fraction, by estimation, is 60 to 65%. The left ventricle has normal function. The left ventricle has no regional wall motion abnormalities. Left ventricular diastolic parameters were normal.  3. Right ventricular systolic function is normal. The right ventricular size is normal. Tricuspid regurgitation signal is inadequate for assessing PA pressure.  4. The mitral valve is normal in  structure. No evidence of mitral valve regurgitation. No evidence of mitral stenosis.  5. The aortic valve is tricuspid. Aortic valve regurgitation is trivial. No aortic stenosis is present.  6. There is borderline dilatation of the aortic root, measuring 38 mm.  7. The inferior vena cava is normal in size with greater than 50% respiratory variability, suggesting right atrial pressure of 3 mmHg. FINDINGS  Left Ventricle: Left ventricular ejection fraction, by estimation, is 60 to 65%. The left ventricle has normal function. The left ventricle has no regional wall motion abnormalities. The left ventricular internal cavity size was normal in size. There is  no left ventricular hypertrophy. Left ventricular diastolic parameters were normal. Right Ventricle: The right ventricular size is normal. Right ventricular systolic function is normal. Tricuspid regurgitation signal is inadequate for assessing PA pressure. The tricuspid regurgitant velocity is 2.22 m/s, and with an assumed right atrial  pressure of 3 mmHg, the estimated right ventricular systolic pressure is 22.7 mmHg. Left Atrium: Left atrial size was normal in size. Right Atrium: Right atrial size was normal in size. Pericardium: There is no evidence of pericardial effusion. Mitral Valve: The mitral valve is normal in structure. No evidence of mitral valve regurgitation. No evidence of mitral valve stenosis. Tricuspid Valve: The tricuspid valve is normal in structure. Tricuspid valve regurgitation is trivial. No evidence of tricuspid stenosis. Aortic Valve: The aortic valve is tricuspid. Aortic valve regurgitation is trivial. No aortic stenosis is present. Pulmonic Valve: The pulmonic valve was normal in structure. Pulmonic valve regurgitation is not visualized. No evidence of pulmonic stenosis. Aorta: The aortic root is normal in size and structure. There is borderline dilatation of the aortic root, measuring 38 mm. Venous: The inferior vena cava is normal in  size with greater than 50% respiratory variability, suggesting right atrial pressure of 3 mmHg. IAS/Shunts: No atrial level shunt detected by color flow Doppler. Additional Comments: Redundant MV chordae noted.  LEFT VENTRICLE PLAX 2D LVIDd:         5.00 cm   Diastology LVIDs:         3.20 cm   LV e' medial:    10.40 cm/s LV PW:         1.00 cm   LV E/e' medial:  8.4 LV IVS:        1.00 cm   LV e' lateral:   14.10 cm/s LVOT diam:     2.00 cm   LV E/e' lateral: 6.2 LV SV:         51 LV SV Index:   26 LVOT Area:     3.14 cm  RIGHT VENTRICLE             IVC RV S prime:     17.50 cm/s  IVC diam: 2.10 cm TAPSE (M-mode): 2.2 cm LEFT ATRIUM             Index        RIGHT ATRIUM           Index LA diam:        3.60 cm 1.81 cm/m   RA Area:     14.30 cm LA Vol (A2C):   45.7 ml 22.98 ml/m  RA Volume:   32.60 ml  16.40 ml/m LA Vol (A4C):   46.5 ml 23.39 ml/m LA Biplane Vol: 47.1 ml 23.69 ml/m  AORTIC VALVE LVOT Vmax:   113.00 cm/s LVOT Vmean:  73.900 cm/s LVOT VTI:    0.163 m  AORTA Ao Root diam: 3.80 cm Ao Asc diam:  3.30 cm MITRAL VALVE  TRICUSPID VALVE MV Area (PHT): 5.02 cm    TR Peak grad:   19.7 mmHg MV Decel Time: 151 msec    TR Vmax:        222.00 cm/s MV E velocity: 87.30 cm/s MV A velocity: 96.30 cm/s  SHUNTS MV E/A ratio:  0.91        Systemic VTI:  0.16 m                            Systemic Diam: 2.00 cm Olga Millers MD Electronically signed by Olga Millers MD Signature Date/Time: 02/15/2022/2:52:02 PM    Final    DG Knee Complete 4 Views Left  Result Date: 02/15/2022 CLINICAL DATA:  Left knee swelling and pain. EXAM: LEFT KNEE - COMPLETE 4+ VIEW COMPARISON:  None Available. FINDINGS: No evidence of fracture, dislocation, or joint effusion. No evidence of arthropathy or other focal bone abnormality. There is moderate broad-based anterior soft tissue swelling. IMPRESSION: Anterior soft tissue swelling without acute underlying bone abnormality or visible joint effusion. Electronically Signed    By: Almira Bar M.D.   On: 02/15/2022 05:56     Subjective: No acute issues/events overnight   Discharge Exam: Vitals:   03/05/22 0355 03/05/22 0800  BP: (!) 130/94 121/84  Pulse: 72 93  Resp: 17 18  Temp: 98.2 F (36.8 C) 99.2 F (37.3 C)  SpO2: 98% 100%   Vitals:   03/04/22 1602 03/04/22 2038 03/05/22 0355 03/05/22 0800  BP: (!) 132/92 136/82 (!) 130/94 121/84  Pulse: 88 (!) 106 72 93  Resp: Temp: 98.4 F (36.9 C) 98.5 F (36.9 C) 98.2 F (36.8 C) 99.2 F (37.3 C)  TempSrc: Oral   Oral  SpO2: 100% 100% 98% 100%  Weight:      Height:        General: Pt is alert, awake, not in acute distress Cardiovascular: RRR, S1/S2 +, no rubs, no gallops Respiratory: CTA bilaterally, no wheezing, no rhonchi Abdominal: Soft, NT, ND, bowel sounds + Extremities: no edema, no cyanosis    The results of significant diagnostics from this hospitalization (including imaging, microbiology, ancillary and laboratory) are listed below for reference.     Microbiology: No results found for this or any previous visit (from the past 240 hour(s)).   Labs: BNP (last 3 results) No results for input(s): "BNP" in the last 8760 hours. Basic Metabolic Panel: Recent Labs  Lab 02/28/22 0113 03/05/22 0558  NA 139 138  K 4.0 4.2  CL 105 108  CO2 26 27  GLUCOSE 97 97  BUN 19 14  CREATININE 0.98 0.82  CALCIUM 8.7* 8.7*   Liver Function Tests: No results for input(s): "AST", "ALT", "ALKPHOS", "BILITOT", "PROT", "ALBUMIN" in the last 168 hours. No results for input(s): "LIPASE", "AMYLASE" in the last 168 hours. No results for input(s): "AMMONIA" in the last 168 hours. CBC: Recent Labs  Lab 02/28/22 0113  WBC 10.2  HGB 11.6*  HCT 35.1*  MCV 98.0  PLT 578*   Cardiac Enzymes: Recent Labs  Lab 03/05/22 0558  CKTOTAL 114   BNP: Invalid input(s): "POCBNP" CBG: No results for input(s): "GLUCAP" in the last 168 hours. D-Dimer No results for input(s): "DDIMER" in  the last 72 hours. Hgb A1c No results for input(s): "HGBA1C" in the last 72 hours. Lipid Profile No results for input(s): "CHOL", "HDL", "LDLCALC", "TRIG", "CHOLHDL", "LDLDIRECT" in the last 72 hours. Thyroid function studies  No results for input(s): "TSH", "T4TOTAL", "T3FREE", "THYROIDAB" in the last 72 hours.  Invalid input(s): "FREET3" Anemia work up No results for input(s): "VITAMINB12", "FOLATE", "FERRITIN", "TIBC", "IRON", "RETICCTPCT" in the last 72 hours. Urinalysis    Component Value Date/Time   COLORURINE YELLOW 02/28/2022 1955   APPEARANCEUR CLEAR 02/28/2022 1955   LABSPEC 1.021 02/28/2022 1955   PHURINE 5.0 02/28/2022 1955   GLUCOSEU NEGATIVE 02/28/2022 1955   HGBUR NEGATIVE 02/28/2022 1955   BILIRUBINUR NEGATIVE 02/28/2022 1955   KETONESUR NEGATIVE 02/28/2022 1955   PROTEINUR NEGATIVE 02/28/2022 1955   UROBILINOGEN 0.2 09/23/2014 0050   NITRITE NEGATIVE 02/28/2022 1955   LEUKOCYTESUR NEGATIVE 02/28/2022 1955   Sepsis Labs Recent Labs  Lab 02/28/22 0113  WBC 10.2   Microbiology No results found for this or any previous visit (from the past 240 hour(s)).   Time coordinating discharge: Over 30 minutes  SIGNED:   Azucena Fallen, DO [facility] 03/05/2022, 5:06 PM Pager   If 7PM-7AM, please contact night-coverage [email or web address]

## 2022-03-05 NOTE — Progress Notes (Signed)
[facility]  Date of Admission:  02/14/2022   Total days of inpatient antibiotics 8  Principal Problem:   MRSA bacteremia Active Problems:   Polysubstance abuse (HCC)   Cellulitis   Infection of skin due to methicillin resistant Staphylococcus aureus (MRSA)   Tobacco dependence   Septic arthritis of knee, left (HCC)   Abscess of forehead          Assessment: 46 year old male with history of methamphetamine use with skin picking, recently incarcerated, prior history of right finger infection(7/23) status post I&D with cultures growing MRSA treated with vancomycin inpatient and discharged on doxycycline x1 week admitted with MRSA bacteremia.  #Disseminated MRSA bacteremia involving  left knee, forehead abscess, Left eyelid #Methamphetamine use -  Given his history of recent drug use, patient is not a candidate for home IV antibiotics. - TTE and tee (9/11) showed  no vegetation - 9/8 Bcx + MRSA, 9/10NG -  Left knee SP I&D with Dr. Griffin Basil with Cx+ MRSA on 9/10. Per OR note the purulent material did not enter joint and felt more c/w cellulitis than septic knee.  - Forehead abscess status post I&D with plastic surgery, Cx+ MRSA on 9/11(bedside)   I reviewed previous ID plan with end of 6 weeks iv abx on 10/22 I reviewed current data (POET trial for endocarditis) and oviva trial for bone and joint and also long acting glycopeptide 2 high dose shots for isolated bone/joint infection with staph aureus  He has 1 set of positive bcx that sterilized quickly and negative tee and no other focal bone joint infectio nor deep seated abscess. He has not had more than 2 weeks iv abx  He is uninsured; lives in [place]; mother lives in [place] and likely would be the one to care for him if he gets discharged   Options for abx, in order of preference (all considered equal efficacy for his syndrome) 1) 2 weekly shots of high dose long acting glycopeptide 2) 4 more  weeks of PO abx (linezolid 2 weeks then doxy 2 weeks to minize toxicity) 3) inpatient 4 more weeks daily iv daptomycin   At this time as of 9/26 per SW, patient's outpatient finance support application will take at least 2 weeks to approve for outpatient infusion therapy     Recommendation: -if ok from ortho standpoint, then ok to discharge from id standpoint -on the day of discharge can get one high dose oritavancin (1200 mg) then followed by outpatient dalbavancin 1500 mg 7-10 days later -I would also see patient in around 4-6 weeks in follow up. I have arranged video visit with him as he lives in [place] -I have discussed ID's viewpoint again with primary team and patient's SW/case manager -if patient remains here for the duration due to finance issue, please reengage ID a few days prior to discharge -weekly cbc, cmp, crp -if patient is discharged on glycopeptide plan, there is outpatient infusion setup for him on 10/03 at this time; reengage ID pharmacy to rearrange date if needed -will sign off at this time   Current ID outpatient f/u date: Clinic Follow Up Appt: 11/07 @ 945  @  [facility] [place], [place], [place] [address] Phone: [phone]  .   I spent more than 35 minute reviewing data/chart, and coordinating care and >50% direct face to face time providing counseling/discussing diagnostics/treatment plan with patient    Microbiology:   Antibiotics: Vancomycin  9/7-9/12 Daptomycin 9/12-  Cultures: Blood 9/8 2/2 MRSA 9/10 NG    SUBJECTIVE: No complaint Tolerating abx   Review of Systems: Review of Systems  All other systems reviewed and are negative.    Scheduled Meds:  celecoxib  100 mg Oral BID   docusate sodium  100 mg Oral BID   enoxaparin (LOVENOX) injection  40 mg Subcutaneous Q24H   gabapentin  100 mg Oral TID   mupirocin ointment   Topical BID   sodium chloride flush  3 mL Intravenous Q12H   triamcinolone cream   Topical  TID   Continuous Infusions:  DAPTOmycin (CUBICIN) 600 mg in sodium chloride 0.9 % IVPB 600 mg (03/04/22 2222)   PRN Meds:.acetaminophen, bisacodyl, hydrALAZINE, methocarbamol, ondansetron **OR** ondansetron (ZOFRAN) IV, mouth rinse, oxyCODONE, polyethylene glycol No Known Allergies  OBJECTIVE: Vitals:   03/04/22 1602 03/04/22 2038 03/05/22 0355 03/05/22 0800  BP: (!) 132/92 136/82 (!) 130/94 121/84  Pulse: 88 (!) 106 72 93  Resp: 16 18 17 18   Temp: 98.4 F (36.9 C) 98.5 F (36.9 C) 98.2 F (36.8 C) 99.2 F (37.3 C)  TempSrc: Oral   Oral  SpO2: 100% 100% 98% 100%  Weight:      Height:       Body mass index is 23.05 kg/m.  Exam: General/constitutional: no distress, pleasant HEENT: Normocephalic, PER, Conj Clear, EOMI, Oropharynx clear Neck supple CV: rrr no mrg Lungs: clear to auscultation, normal respiratory effort Abd: Soft, Nontender Ext: no edema Skin: No Rash Neuro: nonfocal MSK: left knee dressing c/d/I; ambulating on legs without issue; normal gait          Lab Results Lab Results  Component Value Date   WBC 10.2 02/28/2022   HGB 11.6 (L) 02/28/2022   HCT 35.1 (L) 02/28/2022   MCV 98.0 02/28/2022   PLT 578 (H) 02/28/2022    Lab Results  Component Value Date   CREATININE 0.82 03/05/2022   BUN 14 03/05/2022   NA 138 03/05/2022   K 4.2 03/05/2022   CL 108 03/05/2022   CO2 27 03/05/2022    Lab Results  Component Value Date   ALT 36 02/24/2022   AST 26 02/24/2022   ALKPHOS 67 02/24/2022   BILITOT <0.1 (L) 02/24/2022        02/26/2022, MD [facility] 03/05/2022, 11:36 AM

## 2022-03-12 ENCOUNTER — Ambulatory Visit (HOSPITAL_COMMUNITY)
Admission: RE | Admit: 2022-03-12 | Discharge: 2022-03-12 | Disposition: A | Discharge status: Home / Self Care | Payer: Medicaid Other | Source: Ambulatory Visit | Attending: Internal Medicine | Admitting: Internal Medicine

## 2022-03-12 VITALS — BP 140/97 | HR 74 | Temp 98.3°F | Resp 18

## 2022-03-12 DIAGNOSIS — R7881 Bacteremia: Secondary | ICD-10-CM | POA: Insufficient documentation

## 2022-03-12 DIAGNOSIS — B9562 Methicillin resistant Staphylococcus aureus infection as the cause of diseases classified elsewhere: Secondary | ICD-10-CM | POA: Insufficient documentation

## 2022-03-12 DIAGNOSIS — M00062 Staphylococcal arthritis, left knee: Secondary | ICD-10-CM | POA: Insufficient documentation

## 2022-03-12 LAB — CBC
HCT: 40.7 % (ref 39.0–52.0)
Hemoglobin: 13.4 g/dL (ref 13.0–17.0)
MCH: 32.5 pg (ref 26.0–34.0)
MCHC: 32.9 g/dL (ref 30.0–36.0)
MCV: 98.8 fL (ref 80.0–100.0)
Platelets: 330 10*3/uL (ref 150–400)
RBC: 4.12 MIL/uL — ABNORMAL LOW (ref 4.22–5.81)
RDW: 13.4 % (ref 11.5–15.5)
WBC: 5.2 10*3/uL (ref 4.0–10.5)
nRBC: 0 % (ref 0.0–0.2)

## 2022-03-12 LAB — COMPREHENSIVE METABOLIC PANEL
ALT: 15 U/L (ref 0–44)
AST: 18 U/L (ref 15–41)
Albumin: 3.7 g/dL (ref 3.5–5.0)
Alkaline Phosphatase: 58 U/L (ref 38–126)
Anion gap: 9 (ref 5–15)
BUN: 11 mg/dL (ref 6–20)
CO2: 25 mmol/L (ref 22–32)
Calcium: 9.5 mg/dL (ref 8.9–10.3)
Chloride: 105 mmol/L (ref 98–111)
Creatinine, Ser: 0.82 mg/dL (ref 0.61–1.24)
GFR, Estimated: >60 mL/min (ref >60)
Glucose, Bld: 110 mg/dL — ABNORMAL HIGH (ref 70–99)
Potassium: 4.4 mmol/L (ref 3.5–5.1)
Sodium: 139 mmol/L (ref 135–145)
Total Bilirubin: 0.4 mg/dL (ref 0.3–1.2)
Total Protein: 7.1 g/dL (ref 6.5–8.1)

## 2022-03-12 LAB — SEDIMENTATION RATE: Sed Rate: 6 mm/hr (ref 0–16)

## 2022-03-12 LAB — C-REACTIVE PROTEIN: CRP: 0.5 mg/dL (ref <1.0)

## 2022-03-12 MED ORDER — HEPARIN SOD (PORK) LOCK FLUSH 100 UNIT/ML IV SOLN
INTRAVENOUS | Status: AC
  Filled 2022-03-12: qty 5

## 2022-03-12 MED ORDER — DEXTROSE 5 % IV SOLN
1500.0000 mg | Freq: Once | INTRAVENOUS | Status: AC
  Administered 2022-03-12: 1500 mg via INTRAVENOUS
  Filled 2022-03-12: qty 75

## 2022-03-18 ENCOUNTER — Telehealth: Payer: Self-pay

## 2022-03-18 NOTE — Telephone Encounter (Signed)
Patient called office asking if he needed anymore abx until appointment on 11/7.   Per Dr.Vu - as long as patient had two infusions one week apart. Oritavancin on 9/26 and Dalbavancin on 10/3.   Called patient on mobile number as requested - voicemail box full - unable to leave a message.    [place], CMA

## 2022-03-19 LAB — FUNGAL ORGANISM REFLEX

## 2022-03-19 LAB — FUNGUS CULTURE RESULT

## 2022-03-19 LAB — FUNGUS CULTURE WITH STAIN

## 2022-03-19 NOTE — Telephone Encounter (Signed)
Patient aware and voiced his understanding.   Sudais Banghart P Hatem Cull, CMA  

## 2022-03-25 ENCOUNTER — Telehealth: Payer: Self-pay

## 2022-03-25 NOTE — Telephone Encounter (Signed)
Patient's wife, Dietrich Pates, called requesting advice. Spoke with patient, who provided his verbal permission for this RN to speak with [facility] about his medical information.  Patient has noticed a gland on the right side of his throat that has swollen within the past day. Denies difficulty swallowing. Endorses nausea and diarrhea that began Saturday. Complains of dull, constant 4/10 LUQ pain that began today.  Patient is not currently taking any antibiotics, and has not taken any OTC medication to treat symptoms.  Requesting advice on whether to be concerned about the swollen gland. Also requesting to know if it is ok to take OTC medications to address GI symptoms.  Brook stated patient's left knee appears to be "healing normally". Denies fevers, redness, and warmth. States knee is swollen, but that patient is "on it all the time".  Routed to provider.

## 2022-03-25 NOTE — Telephone Encounter (Signed)
Called and spoke with [facility]. Relayed message from Dr. Gale Journey that symptoms were unrelated to patient's knee. Per provider, advised that patient follow up with primary care provider. Brook requested information on over-the counter medications for patient's symptoms; recommended she discuss with her pharmacist. She stated understanding and had no additional questions.  Binnie Kand, RN

## 2022-03-25 NOTE — Telephone Encounter (Signed)
Not related to his knee  Please advise follow up with pcp. Sounds potentially could be a uri

## 2022-03-29 ENCOUNTER — Ambulatory Visit: Payer: Medicaid Other | Admitting: Internal Medicine

## 2022-04-01 ENCOUNTER — Ambulatory Visit: Payer: Self-pay | Admitting: Physician Assistant

## 2022-04-01 ENCOUNTER — Encounter: Payer: Self-pay | Admitting: Physician Assistant

## 2022-04-01 VITALS — BP 132/97 | HR 77 | Ht 72.0 in

## 2022-04-01 DIAGNOSIS — F1910 Other psychoactive substance abuse, uncomplicated: Secondary | ICD-10-CM

## 2022-04-01 DIAGNOSIS — J3089 Other allergic rhinitis: Secondary | ICD-10-CM

## 2022-04-01 MED ORDER — CETIRIZINE HCL 10 MG PO TABS: 10.0000 mg | ORAL_TABLET | Freq: Every day | ORAL | 11 refills | Status: DC

## 2022-04-01 NOTE — Progress Notes (Signed)
New Patient Office Visit  Subjective    Patient ID: Eric Woods, male    DOB: 01-01-1976  Age: 46 y.o. MRN: [id number]  CC:  Chief Complaint  Patient presents with   Sore Throat   Headache   URI    HPI S[place] Tribe states that he started having a swollen gland on the right side of his neck about a week ago, states that he started having a sore throat and headache since then .  Did have body aches, but states that they have improved since then. Eating and drinking okay.   No sick contacts.   Has tried tylenol with a little relief.  Would like to restart suboxone .  States that he was previously on this medication during incarceration.  States that it did offer relief.   Outpatient Encounter Medications as of 04/01/2022  Medication Sig   acetaminophen (TYLENOL) 325 MG tablet Take 2 tablets (650 mg total) by mouth every 6 (six) hours as needed for mild pain or moderate pain.   celecoxib (CELEBREX) 100 MG capsule Take 1 capsule (100 mg total) by mouth 2 (two) times daily.   gabapentin (NEURONTIN) 100 MG capsule Take 1 capsule (100 mg total) by mouth 3 (three) times daily.   mupirocin ointment (BACTROBAN) 2 % Apply topically 2 (two) times daily.   traMADol (ULTRAM) 50 MG tablet Take 1 tablet (50 mg total) by mouth every 6 (six) hours as needed.   triamcinolone cream (KENALOG) 0.1 % Apply topically 3 (three) times daily.   No facility-administered encounter medications on file as of 04/01/2022.    Past Medical History:  Diagnosis Date   Infection of skin due to methicillin resistant Staphylococcus aureus (MRSA)    Polysubstance abuse (HCC)    Tobacco dependence     Past Surgical History:  Procedure Laterality Date   ABDOMINAL SURGERY     HAND SURGERY     I & D EXTREMITY Right 12/19/2021   Procedure: IRRIGATION AND DEBRIDEMENT EXTREMITY;  Surgeon: Gomez Cleverly, MD;  Location: [facility] OR;  Service: Orthopedics;  Laterality: Right;   IRRIGATION AND DEBRIDEMENT KNEE Left  02/17/2022   Procedure: IRRIGATION AND DEBRIDEMENT KNEE;  Surgeon: Bjorn Pippin, MD;  Location: [facility] OR;  Service: Orthopedics;  Laterality: Left;   TEE WITHOUT CARDIOVERSION N/A 02/18/2022   Procedure: TRANSESOPHAGEAL ECHOCARDIOGRAM (TEE);  Surgeon: Chrystie Nose, MD;  Location: [facility] ENDOSCOPY;  Service: Cardiovascular;  Laterality: N/A;    No family history on file.  Social History   Socioeconomic History   Marital status: Single    Spouse name: Not on file   Number of children: Not on file   Years of education: Not on file   Highest education level: Not on file  Occupational History   Occupation: unemployed  Tobacco Use   Smoking status: Every Day    Types: Cigarettes   Smokeless tobacco: Not on file  Substance and Sexual Activity   Alcohol use: Not Currently    Comment: not his drug of choice   Drug use: Yes    Types: Marijuana, Methamphetamines   Sexual activity: Not on file  Other Topics Concern   Not on file  Social History Narrative   Not on file   Social Determinants of Health   Financial Resource Strain: Not on file  Food Insecurity: No Food Insecurity (02/15/2022)   Hunger Vital Sign    Worried About Running Out of Food in the Last Year: Never true    Ran  Out of Food in the Last Year: Never true  Transportation Needs: No Transportation Needs (02/15/2022)   PRAPARE - Hydrologist (Medical): No    Lack of Transportation (Non-Medical): No  Physical Activity: Not on file  Stress: Not on file  Social Connections: Not on file  Intimate Partner Violence: Not At Risk (02/15/2022)   Humiliation, Afraid, Rape, and Kick questionnaire    Fear of Current or Ex-Partner: No    Emotionally Abused: No    Physically Abused: No    Sexually Abused: No    Review of Systems  Constitutional:  Negative for chills and fever.  HENT:  Positive for sore throat. Negative for congestion, ear pain and sinus pain.   Eyes: Negative.   Respiratory:  Negative  for cough and shortness of breath.   Cardiovascular:  Negative for chest pain.  Gastrointestinal:  Negative for abdominal pain, nausea and vomiting.  Genitourinary: Negative.   Musculoskeletal:  Negative for myalgias.  Skin: Negative.   Neurological: Negative.   Endo/Heme/Allergies: Negative.   Psychiatric/Behavioral: Negative.          Objective    BP (!) 132/97 (BP Location: Right Arm, Patient Position: Sitting, Cuff Size: Normal)   Pulse 77   Ht 6' (1.829 m)   BMI 23.06 kg/m   Physical Exam Vitals and nursing note reviewed.  Constitutional:      Appearance: Normal appearance.  HENT:     Head: Normocephalic and atraumatic.     Right Ear: External ear normal.     Left Ear: External ear normal.     Nose: Nose normal.     Right Sinus: No maxillary sinus tenderness or frontal sinus tenderness.     Left Sinus: No maxillary sinus tenderness or frontal sinus tenderness.     Mouth/Throat:     Lips: Pink.     Mouth: Mucous membranes are moist.     Pharynx: Oropharynx is clear.     Tonsils: 0 on the right. 0 on the left.  Eyes:     Extraocular Movements: Extraocular movements intact.     Conjunctiva/sclera: Conjunctivae normal.     Pupils: Pupils are equal, round, and reactive to light.  Cardiovascular:     Rate and Rhythm: Normal rate and regular rhythm.     Pulses: Normal pulses.     Heart sounds: Normal heart sounds.  Pulmonary:     Effort: Pulmonary effort is normal.     Breath sounds: Normal breath sounds.  Musculoskeletal:        General: Normal range of motion.  Lymphadenopathy:     Cervical: No cervical adenopathy.  Skin:    General: Skin is warm and dry.  Neurological:     General: No focal deficit present.     Mental Status: He is alert and oriented to person, place, and time.  Psychiatric:        Mood and Affect: Mood normal.        Thought Content: Thought content normal.        Judgment: Judgment normal.       Assessment & Plan:   Problem List  Items Addressed This Visit   None  1. Seasonal allergic rhinitis due to other allergic trigger Trial Zyrtec.  Patient education given on supportive care.  Red flags given for prompt reevaluation  Patient has application for [employer] financial assistance.  [facility], however [place] location is  more convenient for patient.  Will reach out to [facility] care team for further assistance. - cetirizine (ZYRTEC ALLERGY) 10 MG tablet; Take 1 tablet (10 mg total) by mouth daily.  Dispense: 30 tablet; Refill: 11  2. Polysubstance abuse [facility]) Patient given phone number to help with resources for Suboxone treatment.   I have reviewed the patient's medical history (PMH, PSH, Social History, Family History, Medications, and allergies) , and have been updated if relevant. I spent 20 minutes reviewing chart and  face to face time with patient.   No follow-ups on file.   Loraine Grip Mayers, PA-C

## 2022-04-02 ENCOUNTER — Encounter: Payer: Self-pay | Admitting: Physician Assistant

## 2022-04-02 NOTE — Patient Instructions (Signed)
You are going to use Zyrtec once daily to help with your sore throat and sinus drainage.  I encourage you to stay well-hydrated and get plenty of rest.  I have sent your information to our nurse in [place] to help you with resources for obtaining a primary care provider closer to home.  She will also reach out to you with any resources she may have to help you with Suboxone clinics.  Please let us know if there is anything else we can do for you  Eric Rad, PA-C Physician Assistant [facility] [email or web address]   Sore Throat A sore throat is pain, burning, irritation, or scratchiness in the throat. When you have a sore throat, you may feel pain or tenderness in your throat when you swallow or talk. Many things can cause a sore throat, including: An infection. Seasonal allergies. Dryness in the air. Irritants, such as smoke or pollution. Radiation treatment for cancer. Gastroesophageal reflux disease (GERD). A tumor. A sore throat is often the first sign of another sickness. It may happen with other symptoms, such as coughing, sneezing, fever, and swollen neck glands. Most sore throats go away without medical treatment. Follow these instructions at home:     Medicines Take over-the-counter and prescription medicines only as told by your health care provider. Children often get sore throats. Do not give your child aspirin because of the association with Reye's syndrome. Use throat sprays to soothe your throat as told by your health care provider. Managing pain To help with pain, try: Sipping warm liquids, such as broth, herbal tea, or warm water. Eating or drinking cold or frozen liquids, such as frozen ice pops. Gargling with a mixture of salt and water 3-4 times a day or as needed. To make salt water, completely dissolve -1 tsp (3-6 g) of salt in 1 cup (237 mL) of warm water. Sucking on hard candy or throat  lozenges. Putting a cool-mist humidifier in your bedroom at night to moisten the air. Sitting in the bathroom with the door closed for 5-10 minutes while you run hot water in the shower. General instructions Do not use any products that contain nicotine or tobacco. These products include cigarettes, chewing tobacco, and vaping devices, such as e-cigarettes. If you need help quitting, ask your health care provider. Rest as needed. Drink enough fluid to keep your urine pale yellow. Wash your hands often with soap and water for at least 20 seconds. If soap and water are not available, use hand sanitizer. Contact a health care provider if: You have a fever for more than 2-3 days. You have symptoms that last for more than 2-3 days. Your throat does not get better within 7 days. You have a fever and your symptoms suddenly get worse. Get help right away if: You have difficulty breathing. You cannot swallow fluids, soft foods, or your saliva. You have increased swelling in your throat or neck. You have persistent nausea and vomiting. These symptoms may represent a serious problem that is an emergency. Do not wait to see if the symptoms will go away. Get medical help right away. Call your local emergency services (911 in the [place] Do not drive yourself to the hospital. Summary A sore throat is pain, burning, irritation, or scratchiness in the throat. Many things can cause a sore throat. Take over-the-counter medicines only as told by your health care provider. Rest as needed. Drink enough fluid to keep your urine pale yellow. Contact a health care provider  if your throat does not get better within 7 days. This information is not intended to replace advice given to you by your health care provider. Make sure you discuss any questions you have with your health care provider. Document Revised: 08/23/2020 Document Reviewed: 08/23/2020 Elsevier Patient Education  2023 [employer].

## 2022-04-04 ENCOUNTER — Ambulatory Visit: Payer: Self-pay | Admitting: Physician Assistant

## 2022-04-04 ENCOUNTER — Encounter: Payer: Self-pay | Admitting: Physician Assistant

## 2022-04-04 VITALS — BP 137/103 | Ht 72.0 in | Wt 184.0 lb

## 2022-04-04 DIAGNOSIS — F332 Major depressive disorder, recurrent severe without psychotic features: Secondary | ICD-10-CM

## 2022-04-04 DIAGNOSIS — G894 Chronic pain syndrome: Secondary | ICD-10-CM

## 2022-04-04 DIAGNOSIS — F411 Generalized anxiety disorder: Secondary | ICD-10-CM

## 2022-04-04 MED ORDER — GABAPENTIN 100 MG PO CAPS: 200.0000 mg | ORAL_CAPSULE | Freq: Three times a day (TID) | ORAL | 1 refills | Status: DC

## 2022-04-04 MED ORDER — FLUOXETINE HCL 20 MG PO TABS: 20.0000 mg | ORAL_TABLET | Freq: Every day | ORAL | 1 refills | Status: DC

## 2022-04-04 NOTE — Patient Instructions (Signed)
You are going to take Prozac 20 mg once daily.  It may take 3 to 4 weeks before you notice any difference.  It is most important to remain consistent with the medication.  I do encourage you to work on improving your sleep.  You should aim to sleep 7 to 8 hours a night.  I encourage you to start vitamin D 2000 units once daily.  You will purchase this over-the-counter.  To help with your chronic pain, you are going to take 200 mg of gabapentin 3 times a day.  I strongly encourage you not to increase this dosing on your own.  Please return to the mobile unit in 3 to 4 weeks for follow-up if you are unable to be seen by a primary care provider.  Roney Jaffe, PA-C Physician Assistant [facility] [email or web address]   Managing Depression, Adult Depression is a mental health condition that affects your thoughts, feelings, and actions. Being diagnosed with depression can bring you relief if you did not know why you have felt or behaved a certain way. It could also leave you feeling overwhelmed. Finding ways to manage your symptoms can help you feel more positive about your future. How to manage lifestyle changes Being depressed is difficult. Depression can increase the level of everyday stress. Stress can make depression symptoms worse. You may believe your symptoms cannot be managed or will never improve. However, there are many things you can try to help manage your symptoms. There is hope. Managing stress  Stress is your body's reaction to life changes and events, both good and bad. Stress can add to your feelings of depression. Learning to manage your stress can help lessen your feelings of depression. Try some of the following approaches to reducing your stress (stress reduction techniques): Listen to music that you enjoy and that inspires you. Try using a meditation app or take a meditation class. Develop a practice that helps you  connect with your spiritual self. Walk in nature, pray, or go to a place of worship. Practice deep breathing. To do this, inhale slowly through your nose. Pause at the top of your inhale for a few seconds and then exhale slowly, letting yourself relax. Repeat this three or four times. Practice yoga to help relax and work your muscles. Choose a stress reduction technique that works for you. These techniques take time and practice to develop. Set aside 5-15 minutes a day to do them. Therapists can offer training in these techniques. Do these things to help manage stress: Keep a journal. Know your limits. Set healthy boundaries for yourself and others, such as saying "no" when you think something is too much. Pay attention to how you react to certain situations. You may not be able to control everything, but you can change your reaction. Add humor to your life by watching funny movies or shows. Make time for activities that you enjoy and that relax you. Spend less time using electronics, especially at night before bed. The light from screens can make your brain think it is time to get up rather than go to bed.  Medicines Medicines, such as antidepressants, are often a part of treatment for depression. Talk with your pharmacist or health care provider about all the medicines, supplements, and herbal products that you take, their possible side effects, and what medicines and other products are safe to take together. Make sure to report any side effects you may have to your health care provider. Relationships Your  health care provider may suggest family therapy, couples therapy, or individual therapy as part of your treatment. How to recognize changes Everyone responds differently to treatment for depression. As you recover from depression, you may start to: Have more interest in doing activities. Feel more hopeful. Have more energy. Eat a more regular amount of food. Have better mental focus. It  is important to recognize if your depression is not getting better or is getting worse. The symptoms you had in the beginning may return, such as: Feeling tired. Eating too much or too little. Sleeping too much or too little. Feeling restless, agitated, or hopeless. Trouble focusing or making decisions. Having unexplained aches and pains. Feeling irritable, angry, or aggressive. If you or your family members notice these symptoms coming back, let your health care provider know right away. Follow these instructions at home: Activity Try to get some form of exercise each day, such as walking. Try yoga, mindfulness, or other stress reduction techniques. Participate in group activities if you are able. Lifestyle Get enough sleep. Cut down on or stop using caffeine, tobacco, alcohol, and any other harmful substances. Eat a healthy diet that includes plenty of vegetables, fruits, whole grains, low-fat dairy products, and lean protein. Limit foods that are high in solid fats, added sugar, or salt (sodium). General instructions Take over-the-counter and prescription medicines only as told by your health care provider. Keep all follow-up visits. It is important for your health care provider to check on your mood, behavior, and medicines. Your health care provider may need to make changes to your treatment. Where to find support Talking to others  Friends and family members can be sources of support and guidance. Talk to trusted friends or family members about your condition. Explain your symptoms and let them know that you are working with a health care provider to treat your depression. Tell friends and family how they can help. Finances Find mental health providers that fit with your financial situation. Talk with your health care provider if you are worried about access to food, housing, or medicine. Call your insurance company to learn about your co-pays and prescription plan. Where to find  more information You can find support in your area from: Anxiety and Depression Association of America (ADAA): adaa.org Mental Health America: mentalhealthamerica.net [employer] on Mental Illness: nami.org Contact a health care provider if: You stop taking your antidepressant medicines, and you have any of these symptoms: Nausea. Headache. Light-headedness. Chills and body aches. Not being able to sleep (insomnia). You or your friends and family think your depression is getting worse. Get help right away if: You have thoughts of hurting yourself or others. Get help right away if you feel like you may hurt yourself or others, or have thoughts about taking your own life. Go to your nearest emergency room or: Call 911. Call the [place] at (475) 654-2653 or 988. This is open 24 hours a day. Text the Crisis Text Line at 587-025-3525. This information is not intended to replace advice given to you by your health care provider. Make sure you discuss any questions you have with your health care provider. Document Revised: 10/02/2021 Document Reviewed: 10/02/2021 Elsevier Patient Education  [employer].  Insomnia Insomnia is a sleep disorder that makes it difficult to fall asleep or stay asleep. Insomnia can cause fatigue, low energy, difficulty concentrating, mood swings, and poor performance at work or school. There are three different ways to classify insomnia: Difficulty falling asleep. Difficulty  staying asleep. Waking up too early in the morning. Any type of insomnia can be long-term (chronic) or short-term (acute). Both are common. Short-term insomnia usually lasts for 3 months or less. Chronic insomnia occurs at least three times a week for longer than 3 months. What are the causes? Insomnia may be caused by another condition, situation, or substance, such as: Having certain mental health conditions, such as anxiety and depression. Using caffeine,  alcohol, tobacco, or drugs. Having gastrointestinal conditions, such as gastroesophageal reflux disease (GERD). Having certain medical conditions. These include: Asthma. Alzheimer's disease. Stroke. Chronic pain. An overactive thyroid gland (hyperthyroidism). Other sleep disorders, such as restless legs syndrome and sleep apnea. Menopause. Sometimes, the cause of insomnia may not be known. What increases the risk? Risk factors for insomnia include: Gender. Females are affected more often than males. Age. Insomnia is more common as people get older. Stress and certain medical and mental health conditions. Lack of exercise. Having an irregular work schedule. This may include working night shifts and traveling between different time zones. What are the signs or symptoms? If you have insomnia, the main symptom is having trouble falling asleep or having trouble staying asleep. This may lead to other symptoms, such as: Feeling tired or having low energy. Feeling nervous about going to sleep. Not feeling rested in the morning. Having trouble concentrating. Feeling irritable, anxious, or depressed. How is this diagnosed? This condition may be diagnosed based on: Your symptoms and medical history. Your health care provider may ask about: Your sleep habits. Any medical conditions you have. Your mental health. A physical exam. How is this treated? Treatment for insomnia depends on the cause. Treatment may focus on treating an underlying condition that is causing the insomnia. Treatment may also include: Medicines to help you sleep. Counseling or therapy. Lifestyle adjustments to help you sleep better. Follow these instructions at home: Eating and drinking  Limit or avoid alcohol, caffeinated beverages, and products that contain nicotine and tobacco, especially close to bedtime. These can disrupt your sleep. Do not eat a large meal or eat spicy foods right before bedtime. This can lead  to digestive discomfort that can make it hard for you to sleep. Sleep habits  Keep a sleep diary to help you and your health care provider figure out what could be causing your insomnia. Write down: When you sleep. When you wake up during the night. How well you sleep and how rested you feel the next day. Any side effects of medicines you are taking. What you eat and drink. Make your bedroom a dark, comfortable place where it is easy to fall asleep. Put up shades or blackout curtains to block light from outside. Use a white noise machine to block noise. Keep the temperature cool. Limit screen use before bedtime. This includes: Not watching TV. Not using your smartphone, tablet, or computer. Stick to a routine that includes going to bed and waking up at the same times every day and night. This can help you fall asleep faster. Consider making a quiet activity, such as reading, part of your nighttime routine. Try to avoid taking naps during the day so that you sleep better at night. Get out of bed if you are still awake after 15 minutes of trying to sleep. Keep the lights down, but try reading or doing a quiet activity. When you feel sleepy, go back to bed. General instructions Take over-the-counter and prescription medicines only as told by your health care provider. Exercise regularly as  told by your health care provider. However, avoid exercising in the hours right before bedtime. Use relaxation techniques to manage stress. Ask your health care provider to suggest some techniques that may work well for you. These may include: Breathing exercises. Routines to release muscle tension. Visualizing peaceful scenes. Make sure that you drive carefully. Do not drive if you feel very sleepy. Keep all follow-up visits. This is important. Contact a health care provider if: You are tired throughout the day. You have trouble in your daily routine due to sleepiness. You continue to have sleep  problems, or your sleep problems get worse. Get help right away if: You have thoughts about hurting yourself or someone else. Get help right away if you feel like you may hurt yourself or others, or have thoughts about taking your own life. Go to your nearest emergency room or: Call 911. Call the National Suicide Prevention Lifeline at 910-181-7235 or 988. This is open 24 hours a day. Text the Crisis Text Line at 559-426-0651. Summary Insomnia is a sleep disorder that makes it difficult to fall asleep or stay asleep. Insomnia can be long-term (chronic) or short-term (acute). Treatment for insomnia depends on the cause. Treatment may focus on treating an underlying condition that is causing the insomnia. Keep a sleep diary to help you and your health care provider figure out what could be causing your insomnia. This information is not intended to replace advice given to you by your health care provider. Make sure you discuss any questions you have with your health care provider. Document Revised: 05/07/2021 Document Reviewed: 05/07/2021 Elsevier Patient Education  2023 [employer].
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
## 2022-04-06 ENCOUNTER — Emergency Department (HOSPITAL_COMMUNITY)
Admission: EM | Admit: 2022-04-06 | Discharge: 2022-04-07 | Disposition: A | Discharge status: Home / Self Care | Payer: No Typology Code available for payment source | Attending: Emergency Medicine | Admitting: Emergency Medicine

## 2022-04-06 DIAGNOSIS — S335XXA Sprain of ligaments of lumbar spine, initial encounter: Secondary | ICD-10-CM

## 2022-04-06 DIAGNOSIS — M25512 Pain in left shoulder: Secondary | ICD-10-CM

## 2022-04-06 DIAGNOSIS — M7989 Other specified soft tissue disorders: Secondary | ICD-10-CM | POA: Insufficient documentation

## 2022-04-06 DIAGNOSIS — R2242 Localized swelling, mass and lump, left lower limb: Secondary | ICD-10-CM

## 2022-04-06 DIAGNOSIS — Y92410 Unspecified street and highway as the place of occurrence of the external cause: Secondary | ICD-10-CM | POA: Insufficient documentation

## 2022-04-06 DIAGNOSIS — S339XXA Sprain of unspecified parts of lumbar spine and pelvis, initial encounter: Secondary | ICD-10-CM | POA: Insufficient documentation

## 2022-04-06 DIAGNOSIS — S3992XA Unspecified injury of lower back, initial encounter: Secondary | ICD-10-CM | POA: Diagnosis present

## 2022-04-06 NOTE — ED Notes (Signed)
PT called and not there

## 2022-04-07 ENCOUNTER — Encounter (HOSPITAL_COMMUNITY): Payer: Self-pay

## 2022-04-07 ENCOUNTER — Other Ambulatory Visit: Payer: Self-pay

## 2022-04-07 ENCOUNTER — Encounter (HOSPITAL_COMMUNITY): Payer: Self-pay | Admitting: Internal Medicine

## 2022-04-07 ENCOUNTER — Emergency Department (HOSPITAL_COMMUNITY): Payer: No Typology Code available for payment source
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
## 2022-04-07 NOTE — ED Notes (Signed)
Patient transported to X-ray 

## 2022-04-07 NOTE — Discharge Instructions (Signed)
Wear the sling as needed.  Apply ice to all areas that are painful.  Ice should be applied for 30 minutes at a time, 4 times a day.  Take ibuprofen or naproxen as needed for pain.  For additional pain relief, add acetaminophen.  Please be aware that when you combine acetaminophen with either naproxen or ibuprofen, you get better pain relief than you get from taking either medication by itself.  Follow-up with your orthopedic specialist.

## 2022-04-16 ENCOUNTER — Ambulatory Visit (INDEPENDENT_AMBULATORY_CARE_PROVIDER_SITE_OTHER): Payer: Self-pay | Admitting: Internal Medicine

## 2022-04-16 ENCOUNTER — Encounter: Payer: Self-pay | Admitting: Internal Medicine

## 2022-04-16 ENCOUNTER — Other Ambulatory Visit: Payer: Self-pay

## 2022-04-16 VITALS — BP 143/79 | HR 89 | Resp 16 | Ht 72.0 in | Wt 191.0 lb

## 2022-04-16 DIAGNOSIS — R7881 Bacteremia: Secondary | ICD-10-CM

## 2022-04-16 DIAGNOSIS — B9562 Methicillin resistant Staphylococcus aureus infection as the cause of diseases classified elsewhere: Secondary | ICD-10-CM

## 2022-04-16 DIAGNOSIS — M00062 Staphylococcal arthritis, left knee: Secondary | ICD-10-CM
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
## 2022-04-16 NOTE — Patient Instructions (Signed)
Your infection appears to be treated  The left knee continue good wound care as ortho advised  Do not pick on any wound   Follow up with me as needed if fever, chill, new/persistent back/joint pain

## 2022-04-18 ENCOUNTER — Telehealth: Payer: Self-pay

## 2022-04-18 NOTE — Telephone Encounter (Signed)
Was provided patient's information by [facility] Child psychotherapist. Reached out to patient today and patient declined [facility] services at this time. Let patient know to call our number if they change their mind about the program. Patient understood

## 2022-05-10 ENCOUNTER — Encounter (HOSPITAL_COMMUNITY): Payer: Self-pay | Admitting: Internal Medicine

## 2022-05-16 ENCOUNTER — Inpatient Hospital Stay: Payer: Medicaid Other | Admitting: Internal Medicine

## 2022-06-01 ENCOUNTER — Emergency Department (HOSPITAL_COMMUNITY)
Admission: EM | Admit: 2022-06-01 | Discharge: 2022-06-01 | Discharge status: Against Medical Advice | Payer: Medicaid Other | Attending: Emergency Medicine | Admitting: Emergency Medicine

## 2022-06-01 ENCOUNTER — Encounter (HOSPITAL_COMMUNITY): Payer: Self-pay

## 2022-06-01 DIAGNOSIS — Z5321 Procedure and treatment not carried out due to patient leaving prior to being seen by health care provider: Secondary | ICD-10-CM | POA: Insufficient documentation

## 2022-06-01 DIAGNOSIS — S81002A Unspecified open wound, left knee, initial encounter: Secondary | ICD-10-CM | POA: Insufficient documentation

## 2022-06-01 DIAGNOSIS — X58XXXA Exposure to other specified factors, initial encounter: Secondary | ICD-10-CM | POA: Diagnosis not present

## 2022-06-01 LAB — CBC WITH DIFFERENTIAL/PLATELET
Abs Immature Granulocytes: 0.01 10*3/uL (ref 0.00–0.07)
Basophils Absolute: 0.1 10*3/uL (ref 0.0–0.1)
Basophils Relative: 1 %
Eosinophils Absolute: 0.3 10*3/uL (ref 0.0–0.5)
Eosinophils Relative: 5 %
HCT: 39.8 % (ref 39.0–52.0)
Hemoglobin: 13.6 g/dL (ref 13.0–17.0)
Immature Granulocytes: 0 %
Lymphocytes Relative: 28 %
Lymphs Abs: 1.7 10*3/uL (ref 0.7–4.0)
MCH: 32.4 pg (ref 26.0–34.0)
MCHC: 34.2 g/dL (ref 30.0–36.0)
MCV: 94.8 fL (ref 80.0–100.0)
Monocytes Absolute: 0.5 10*3/uL (ref 0.1–1.0)
Monocytes Relative: 9 %
Neutro Abs: 3.6 10*3/uL (ref 1.7–7.7)
Neutrophils Relative %: 57 %
Platelets: 323 10*3/uL (ref 150–400)
RBC: 4.2 MIL/uL — ABNORMAL LOW (ref 4.22–5.81)
RDW: 12.5 % (ref 11.5–15.5)
WBC: 6.2 10*3/uL (ref 4.0–10.5)
nRBC: 0 % (ref 0.0–0.2)

## 2022-06-01 LAB — COMPREHENSIVE METABOLIC PANEL
ALT: 22 U/L (ref 0–44)
AST: 33 U/L (ref 15–41)
Albumin: 4 g/dL (ref 3.5–5.0)
Alkaline Phosphatase: 51 U/L (ref 38–126)
Anion gap: 6 (ref 5–15)
BUN: 11 mg/dL (ref 6–20)
CO2: 27 mmol/L (ref 22–32)
Calcium: 9.1 mg/dL (ref 8.9–10.3)
Chloride: 105 mmol/L (ref 98–111)
Creatinine, Ser: 0.94 mg/dL (ref 0.61–1.24)
GFR, Estimated: >60 mL/min (ref >60)
Glucose, Bld: 120 mg/dL — ABNORMAL HIGH (ref 70–99)
Potassium: 3.8 mmol/L (ref 3.5–5.1)
Sodium: 138 mmol/L (ref 135–145)
Total Bilirubin: 0.6 mg/dL (ref 0.3–1.2)
Total Protein: 6.9 g/dL (ref 6.5–8.1)

## 2022-06-01 LAB — ETHANOL: Alcohol, Ethyl (B): <10 mg/dL (ref <10)

## 2022-06-01 NOTE — ED Notes (Addendum)
Pt not in waiting area x 1 

## 2022-06-01 NOTE — ED Triage Notes (Signed)
Pt states that he recently gt out of jail. Pt is concerned for staph infection in his forehead. Pt states that he is also concerned for wound in left knee. Pt also states he was snorting suboxone and noticed white in his nose after. Pt appears intoxicated.

## 2022-06-01 NOTE — ED Notes (Signed)
Not in waiting area x 2 

## 2022-06-24 ENCOUNTER — Telehealth (HOSPITAL_COMMUNITY): Payer: Self-pay | Admitting: Emergency Medicine

## 2022-06-24 ENCOUNTER — Emergency Department (HOSPITAL_COMMUNITY)
Admission: EM | Admit: 2022-06-24 | Discharge: 2022-06-24 | Disposition: A | Discharge status: Home / Self Care | Payer: Medicaid Other | Attending: Emergency Medicine | Admitting: Emergency Medicine

## 2022-06-24 ENCOUNTER — Encounter (HOSPITAL_COMMUNITY): Payer: Self-pay | Admitting: Emergency Medicine

## 2022-06-24 ENCOUNTER — Other Ambulatory Visit: Payer: Self-pay

## 2022-06-24 DIAGNOSIS — J3489 Other specified disorders of nose and nasal sinuses: Secondary | ICD-10-CM

## 2022-06-24 DIAGNOSIS — J3481 Nasal mucositis (ulcerative): Secondary | ICD-10-CM | POA: Insufficient documentation

## 2022-06-24 DIAGNOSIS — R220 Localized swelling, mass and lump, head: Secondary | ICD-10-CM | POA: Diagnosis present

## 2022-06-24 MED ORDER — DOXYCYCLINE HYCLATE 100 MG PO CAPS: 100.0000 mg | ORAL_CAPSULE | Freq: Two times a day (BID) | ORAL | 0 refills | Status: DC

## 2022-06-24 MED ORDER — DOXYCYCLINE HYCLATE 100 MG PO TABS
100.0000 mg | ORAL_TABLET | Freq: Once | ORAL | Status: AC
  Administered 2022-06-24: 100 mg via ORAL
  Filled 2022-06-24: qty 1

## 2022-06-24 MED ORDER — ONDANSETRON HCL 4 MG PO TABS: 4.0000 mg | ORAL_TABLET | Freq: Three times a day (TID) | ORAL | 0 refills | Status: DC | PRN

## 2022-06-24 NOTE — Discharge Instructions (Signed)
Continue to apply the mupirocin ointment to the affected area 3 times a day.  Avoid squeezing or sticking pins to the infected area.  Take the antibiotic as directed until finished.  Please return to the emergency department for any new or worsening symptoms such as increasing pain or increased swelling of your face fever or chills.  I have also listed the information for a ear nose and throat provider for you that you may follow-up with at a later date regarding your nasal bone issue.

## 2022-06-24 NOTE — Telephone Encounter (Signed)
Patient's mother came to the emergency department stating that he took prescription doxycycline this afternoon and began vomiting.  Recommended that he continue the doxycycline but this is not a medication allergy but an intolerance.  Prescription written for Zofran and sent to patient's pharmacy of choice.

## 2022-06-24 NOTE — ED Triage Notes (Signed)
Pt presents with facial swelling, PMH substance abuse, currently having facial and lip swelling, possibly sinusitis or staph infection.

## 2022-06-27 NOTE — ED Provider Notes (Signed)
[facility] EMERGENCY DEPARTMENT Provider Note   CSN: [id number] Arrival date & time: 06/24/22  1037     History  Chief Complaint  Patient presents with   Facial Swelling    Eric Woods is a 47 y.o. male.  HPI      Eric Woods is a 47 y.o. male who presents to the Emergency Department complaining of right-sided facial swelling and pain along his right nostril and cheek area.  He has noticed some mild swelling of the right side of his upper lip as well.  Symptoms have been present for several days.  States he has removed some hairs from his nose recently.  He noticed some drainage and crusting along the inside of the right nostril.  Nose is painful to touch.  He denies any dental pain trouble swallowing or chewing.  No ear pain.  No fever or chills.  He had similar symptoms with the other side of his nose in the past and had a significant staph infection.  He has history of substance abuse, but denies any recent substance use.   Home Medications Prior to Admission medications   Medication Sig Start Date End Date Taking? Authorizing Provider  doxycycline (VIBRAMYCIN) 100 MG capsule Take 1 capsule (100 mg total) by mouth 2 (two) times daily. 06/24/22  Yes Soren Pigman, PA-C  acetaminophen (TYLENOL) 325 MG tablet Take 2 tablets (650 mg total) by mouth every 6 (six) hours as needed for mild pain or moderate pain. 12/27/21   Thurnell Lose, MD  celecoxib (CELEBREX) 100 MG capsule Take 1 capsule (100 mg total) by mouth 2 (two) times daily. 03/05/22   Little Ishikawa, MD  cetirizine (ZYRTEC ALLERGY) 10 MG tablet Take 1 tablet (10 mg total) by mouth daily. 04/01/22   Mayers, Cari S, PA-C  FLUoxetine (PROZAC) 20 MG tablet Take 1 tablet (20 mg total) by mouth daily. 04/04/22   Mayers, Cari S, PA-C  gabapentin (NEURONTIN) 100 MG capsule Take 2 capsules (200 mg total) by mouth 3 (three) times daily. 04/04/22   Mayers, Cari S, PA-C  ondansetron (ZOFRAN) 4 MG tablet Take 1 tablet  (4 mg total) by mouth every 8 (eight) hours as needed for nausea or vomiting. 06/24/22   Sobia Karger, PA-C  triamcinolone cream (KENALOG) 0.1 % Apply topically 3 (three) times daily. 03/05/22   Little Ishikawa, MD      Allergies    Patient has no known allergies.    Review of Systems   Review of Systems  Constitutional:  Negative for chills and fever.  HENT:  Positive for facial swelling. Negative for congestion, ear pain, nosebleeds and trouble swallowing.        Pain swelling and crusting to the right nostril  Eyes:  Negative for visual disturbance.  Respiratory:  Negative for shortness of breath.   Cardiovascular:  Negative for chest pain.  Gastrointestinal:  Negative for nausea and vomiting.  Neurological:  Negative for dizziness, weakness and headaches.    Physical Exam Updated Vital Signs BP 123/78   Pulse 78   Temp 98.1 F (36.7 C) (Oral)   Resp 16   Ht 6' (1.829 m)   Wt 84.1 kg   SpO2 97%   BMI 25.14 kg/m  Physical Exam Vitals and nursing note reviewed.  Constitutional:      General: He is not in acute distress.    Appearance: Normal appearance.  HENT:     Right Ear: Tympanic membrane and ear canal normal.  Left Ear: Tympanic membrane and ear canal normal.     Nose: No rhinorrhea.     Comments: Localized pustule just inside the right nostril, there is crusting present.  Mild edema noted along the nasolabial fold and right cheek.  Some edema noted at the base of the nostril as well.  I do not appreciate any edema of the lip.  No trismus, no tongue swelling or obvious dental tenderness    Mouth/Throat:     Mouth: Mucous membranes are moist.     Pharynx: Oropharynx is clear.  Cardiovascular:     Rate and Rhythm: Normal rate and regular rhythm.     Pulses: Normal pulses.  Pulmonary:     Effort: Pulmonary effort is normal.  Musculoskeletal:        General: Normal range of motion.  Skin:    General: Skin is warm.  Neurological:     Mental Status: He  is alert.     ED Results / Procedures / Treatments   Labs (all labs ordered are listed, but only abnormal results are displayed) Labs Reviewed - No data to display  EKG None  Radiology No results found.  Procedures Procedures    Medications Ordered in ED Medications  doxycycline (VIBRA-TABS) tablet 100 mg (100 mg Oral Given 06/24/22 1436)    ED Course/ Medical Decision Making/ A&P                             Medical Decision Making Patient here with symptoms of pain and right-sided facial swelling along with pain of the right nostril.    He has a small pustule within the nostril that appears to be draining and has some crusted material as well.  Some induration noted at the base of the right nostril.  No fluctuance of the face.  No dental tenderness on exam or trismus.  He is handling secretions well.  Tongue is without edema.  Vital signs reassuring and he is nontoxic-appearing.  I suspect his facial symptoms are related to pustule of the nostril.  He does endorse some irritation and trauma to the area.  No clinical suspicion for angioedema.  He does not appear septic.  Amount and/or Complexity of Data Reviewed Discussion of management or test interpretation with external provider(s): Patient has Bactroban cream, first dose of antibiotics given here.  I feel he is appropriate for discharge home, advised to avoid further trauma to the face or nose.  He was given strict return precautions, appears appropriate for discharge home.  Prescription written for doxycycline.    Risk Prescription drug management.           Final Clinical Impression(s) / ED Diagnoses Final diagnoses:  Nostril sore    Rx / DC Orders ED Discharge Orders          Ordered    doxycycline (VIBRAMYCIN) 100 MG capsule  2 times daily        06/24/22 Finley, Ayline Dingus, PA-C 06/27/22 1501    Hayden Rasmussen, MD 06/27/22 1601

## 2022-07-23 ENCOUNTER — Ambulatory Visit: Payer: Medicaid Other | Admitting: Family Medicine

## 2022-07-23 ENCOUNTER — Encounter: Payer: Self-pay | Admitting: Family Medicine

## 2022-07-23 ENCOUNTER — Encounter: Payer: Self-pay | Admitting: *Deleted

## 2022-07-23 VITALS — BP 119/75 | HR 65 | Ht 72.0 in | Wt 205.0 lb

## 2022-07-23 DIAGNOSIS — E559 Vitamin D deficiency, unspecified: Secondary | ICD-10-CM | POA: Diagnosis not present

## 2022-07-23 DIAGNOSIS — Z1211 Encounter for screening for malignant neoplasm of colon: Secondary | ICD-10-CM

## 2022-07-23 DIAGNOSIS — E0789 Other specified disorders of thyroid: Secondary | ICD-10-CM

## 2022-07-23 DIAGNOSIS — R399 Unspecified symptoms and signs involving the genitourinary system: Secondary | ICD-10-CM | POA: Diagnosis not present

## 2022-07-23 DIAGNOSIS — E7849 Other hyperlipidemia: Secondary | ICD-10-CM | POA: Diagnosis not present

## 2022-07-23 DIAGNOSIS — G629 Polyneuropathy, unspecified: Secondary | ICD-10-CM | POA: Insufficient documentation

## 2022-07-23 DIAGNOSIS — R7301 Impaired fasting glucose: Secondary | ICD-10-CM

## 2022-07-23 LAB — POCT URINALYSIS DIP (CLINITEK)
Bilirubin, UA: NEGATIVE
Blood, UA: NEGATIVE
Glucose, UA: NEGATIVE mg/dL
Ketones, POC UA: NEGATIVE mg/dL
Leukocytes, UA: NEGATIVE
Nitrite, UA: NEGATIVE
POC PROTEIN,UA: NEGATIVE
Spec Grav, UA: 1.015 (ref 1.010–1.025)
Urobilinogen, UA: 0.2 E.U./dL
pH, UA: 7.5 (ref 5.0–8.0)

## 2022-07-23 MED ORDER — GABAPENTIN 300 MG PO CAPS: 300.0000 mg | ORAL_CAPSULE | Freq: Three times a day (TID) | ORAL | 1 refills | Status: DC

## 2022-07-23 MED ORDER — TAMSULOSIN HCL 0.4 MG PO CAPS: 0.4000 mg | ORAL_CAPSULE | Freq: Every day | ORAL | 3 refills | Status: DC

## 2022-07-23 NOTE — Assessment & Plan Note (Addendum)
Complains of urgency, frequency with urination especially at nighttime, weak urinary stream, and incomplete bladder emptying Negative UA for leukocytes and nitrates Symptoms have been ongoing for about a year No fever or chills reported No low back pain reported Will treat today with tamsulosin 0.4 mg daily

## 2022-07-23 NOTE — Assessment & Plan Note (Addendum)
Patient has had multiple surgeries, including right hand surgery, irrigation debridement of the left knee, two rotator cuff tear surgeries in the right shoulder, and 1 rotator cuff tear surgery in the left shoulder He reports being in constant pain pain is rated today 8 out of 10 He is on Suboxone 8 mg He complains of numbness and tingling in the left shoulder that radiates to his fingers He takes gabapentin 200 mg 3 times daily with minimal relief of her symptoms Will increase his gabapentin to 300 mg to take 3 times daily He reports follow-up with the orthopedic team in [place] with [facility] for injections in his neck The patient would like a referral to ortho care in [place] Records requested

## 2022-07-23 NOTE — Patient Instructions (Signed)
I appreciate the opportunity to provide care to you today!    Follow up:  3 months  Labs: please stop by the lab during the week to get your blood drawn (CBC, CMP, TSH, Lipid profile, HgA1c, Vit D)  Please pick up your medications at the pharmacy  Referrals today- GI for colonoscopy   Please continue to a heart-healthy diet and increase your physical activities. Try to exercise for 5mns at least five times a week.      It was a pleasure to see you and I look forward to continuing to work together on your health and well-being. Please do not hesitate to call the office if you need care or have questions about your care.   Have a wonderful day and week. With Gratitude, GAlvira MondayMSN, FNP-BC

## 2022-07-23 NOTE — Progress Notes (Addendum)
New Patient Office Visit  Subjective:  Patient ID: Eric Woods, male    DOB: 1975/06/30  Age: 47 y.o. MRN: [id number]  CC:  Chief Complaint  Patient presents with   Establish Care    New patient, never had a pcp. Would like to discuss pain from surgeries, pain level is an 8/10. Has had multiple surgeries. Pt reports frequent urination and sometimes feels like bladder doesn't get emptied completely.      HPI Eric Woods is a 47 y.o. male with past medical history of generalized anxiety disorder, and polysubstance abuse presents for establishing care. For the details of today's visit, please refer to the assessment and plan.     Past Medical History:  Diagnosis Date   Infection of skin due to methicillin resistant Staphylococcus aureus (MRSA)    Polysubstance abuse ([place])    Tobacco dependence     Past Surgical History:  Procedure Laterality Date   ABDOMINAL SURGERY     HAND SURGERY     I & D EXTREMITY Right 12/19/2021   Procedure: IRRIGATION AND DEBRIDEMENT EXTREMITY;  Surgeon: Orene Desanctis, MD;  Location: [facility];  Service: Orthopedics;  Laterality: Right;   IRRIGATION AND DEBRIDEMENT KNEE Left 02/17/2022   Procedure: IRRIGATION AND DEBRIDEMENT KNEE;  Surgeon: Hiram Gash, MD;  Location: [facility];  Service: Orthopedics;  Laterality: Left;   TEE WITHOUT CARDIOVERSION N/A 02/18/2022   Procedure: TRANSESOPHAGEAL ECHOCARDIOGRAM (TEE);  Surgeon: Pixie Casino, MD;  Location: [facility] END[facility];  Service: Cardiovascular;  Laterality: N/A;    History reviewed. No pertinent family history.  Social History   Socioeconomic History   Marital status: Single    Spouse name: Not on file   Number of children: Not on file   Years of education: Not on file   Highest education level: Not on file  Occupational History   Occupation: unemployed  Tobacco Use   Smoking status: Some Days    Types: E-cigarettes   Smokeless tobacco: Not on file   Tobacco comments:    2 packs a week   Vaping Use   Vaping Use: Every day  Substance and Sexual Activity   Alcohol use: Yes    Comment: not his drug of choice   Drug use: Not Currently    Types: Marijuana   Sexual activity: Yes  Other Topics Concern   Not on file  Social History Narrative   Not on file   Social Determinants of Health   Financial Resource Strain: Not on file  Food Insecurity: No Food Insecurity (02/15/2022)   Hunger Vital Sign    Worried About Running Out of Food in the Last Year: Never true    Ran Out of Food in the Last Year: Never true  Transportation Needs: No Transportation Needs (02/15/2022)   PRAPARE - Hydrologist (Medical): No    Lack of Transportation (Non-Medical): No  Physical Activity: Not on file  Stress: Not on file  Social Connections: Not on file  Intimate Partner Violence: Not At Risk (02/15/2022)   Humiliation, Afraid, Rape, and Kick questionnaire    Fear of Current or Ex-Partner: No    Emotionally Abused: No    Physically Abused: No    Sexually Abused: No    ROS Review of Systems  Constitutional:  Negative for fatigue and fever.  Eyes:  Negative for visual disturbance.  Respiratory:  Negative for chest tightness and shortness of breath.   Cardiovascular:  Negative for chest pain  and palpitations.  Genitourinary:  Positive for frequency and urgency.  Neurological:  Negative for dizziness and headaches.    Objective:   Today's Vitals: BP 119/75   Pulse 65   Ht 6' (1.829 m)   Wt 205 lb 0.6 oz (93 kg)   SpO2 95%   BMI 27.81 kg/m   Physical Exam HENT:     Head: Normocephalic.     Right Ear: External ear normal.     Left Ear: External ear normal.     Nose: No congestion or rhinorrhea.     Mouth/Throat:     Mouth: Mucous membranes are moist.  Cardiovascular:     Rate and Rhythm: Regular rhythm.     Heart sounds: No murmur heard. Pulmonary:     Effort: No respiratory distress.     Breath sounds: Normal breath sounds.  Neurological:      Mental Status: He is alert.      Assessment & Plan:   Lower urinary tract symptoms (LUTS) Assessment & Plan: Complains of urgency, frequency with urination especially at nighttime, weak urinary stream, and incomplete bladder emptying Negative UA for leukocytes and nitrates Symptoms have been ongoing for about a year No fever or chills reported No low back pain reported Will treat today with tamsulosin 0.4 mg daily  Orders: -     Tamsulosin HCl; Take 1 capsule (0.4 mg total) by mouth daily.  Dispense: 30 capsule; Refill: 3 -     POCT URINALYSIS DIP (CLINITEK)  Neuropathy Assessment & Plan: Patient has had multiple surgeries, including right hand surgery, irrigation debridement of the left knee, two rotator cuff tear surgeries in the right shoulder, and 1 rotator cuff tear surgery in the left shoulder He reports being in constant pain pain is rated today 8 out of 10 He is on Suboxone 8 mg He complains of numbness and tingling in the left shoulder that radiates to his fingers He takes gabapentin 200 mg 3 times daily with minimal relief of her symptoms Will increase his gabapentin to 300 mg to take 3 times daily He reports follow-up with the orthopedic team in [place] with [facility] for injections in his neck The patient would like a referral to ortho care in [place] requested  Orders: -     Gabapentin; Take 1 capsule (300 mg total) by mouth 3 (three) times daily.  Dispense: 120 capsule; Refill: 1  Vitamin D deficiency -     VITAMIN D 25 Hydroxy (Vit-D Deficiency, Fractures)  Other hyperlipidemia -     CBC with Differential/Platelet -     CMP14+EGFR -     Lipid panel  Other specified disorders of thyroid -     TSH + free T4  Impaired fasting blood sugar -     Hemoglobin A1c  Colon cancer screening -     Ambulatory referral to Gastroenterology     Follow-up: Return in about 3 months (around 10/21/2022).   Alvira Monday, FNP

## 2022-08-02 ENCOUNTER — Encounter (HOSPITAL_COMMUNITY): Payer: Self-pay

## 2022-08-02 ENCOUNTER — Emergency Department (HOSPITAL_COMMUNITY)
Admission: EM | Admit: 2022-08-02 | Discharge: 2022-08-02 | Disposition: A | Discharge status: Home / Self Care | Payer: Medicaid Other | Attending: Emergency Medicine | Admitting: Emergency Medicine

## 2022-08-02 ENCOUNTER — Other Ambulatory Visit: Payer: Self-pay

## 2022-08-02 DIAGNOSIS — H5789 Other specified disorders of eye and adnexa: Secondary | ICD-10-CM | POA: Diagnosis present

## 2022-08-02 DIAGNOSIS — H1089 Other conjunctivitis: Secondary | ICD-10-CM | POA: Diagnosis not present

## 2022-08-02 DIAGNOSIS — H1032 Unspecified acute conjunctivitis, left eye: Secondary | ICD-10-CM

## 2022-08-02 MED ORDER — POLYMYXIN B-TRIMETHOPRIM 10000-0.1 UNIT/ML-% OP SOLN: 2.0000 [drp] | OPHTHALMIC | 0 refills | Status: DC

## 2022-08-02 NOTE — ED Provider Notes (Signed)
[facility] Provider Note   CSN: [id number] Arrival date & time: 08/02/22  1900     History  Chief Complaint  Patient presents with   Eye Drainage    Eric Woods is a 47 y.o. male.  47 year old male presents today for evaluation of left eye redness and drainage ongoing since this morning.  He states he woke up this morning with his left eye matted shut.  Ongoing drainage since then.  Denies change in vision.  Does not work around metals or anything else that might of flew into his eye.  Does not wear contact lenses.  No associated headache.  The history is provided by the patient. No language interpreter was used.       Home Medications Prior to Admission medications   Medication Sig Start Date End Date Taking? Authorizing Provider  trimethoprim-polymyxin b (POLYTRIM) ophthalmic solution Place 2 drops into the left eye every 4 (four) hours. 08/02/22  Yes Amani Marseille, PA-C  gabapentin (NEURONTIN) 300 MG capsule Take 1 capsule (300 mg total) by mouth 3 (three) times daily. 07/23/22   Alvira Monday, FNP  tamsulosin (FLOMAX) 0.4 MG CAPS capsule Take 1 capsule (0.4 mg total) by mouth daily. 07/23/22   Alvira Monday, [place]      Allergies    Patient has no known allergies.    Review of Systems   Review of Systems  Constitutional:  Negative for chills and fever.  Eyes:  Positive for discharge and redness. Negative for photophobia, pain, itching and visual disturbance.  Neurological:  Negative for headaches.  All other systems reviewed and are negative.   Physical Exam Updated Vital Signs BP 129/79 (BP Location: Right Arm)   Pulse 80   Temp 97.7 F (36.5 C) (Oral)   Resp 18   Ht 6' (1.829 m)   Wt 93 kg   SpO2 97%   BMI 27.80 kg/m  Physical Exam Vitals and nursing note reviewed.  Constitutional:      General: He is not in acute distress.    Appearance: Normal appearance. He is not ill-appearing.  HENT:     Head:  Normocephalic and atraumatic.     Nose: Nose normal.  Eyes:     General: Lids are normal. Vision grossly intact. Gaze aligned appropriately.        Left eye: Discharge present.    Extraocular Movements:     Right eye: Normal extraocular motion.     Left eye: Normal extraocular motion.     Conjunctiva/sclera:     Left eye: Left conjunctiva is injected.  Pulmonary:     Effort: Pulmonary effort is normal. No respiratory distress.  Musculoskeletal:        General: No deformity.  Skin:    Findings: No rash.  Neurological:     Mental Status: He is alert.     ED Results / Procedures / Treatments   Labs (all labs ordered are listed, but only abnormal results are displayed) Labs Reviewed - No data to display  EKG None  Radiology No results found.  Procedures Procedures    Medications Ordered in ED Medications - No data to display  ED Course/ Medical Decision Making/ A&P                             Medical Decision Making Risk Prescription drug management.   47 year old male presents with left eye conjunctivitis.  Does not  wear contact lenses.  He does not have concern for foreign body.  Does not complain of pain.  Consistent with bacterial conjunctivitis.  Will prescribe Polytrim.  Return precaution discussed.  Discussed follow-up with PCP.  Patient voices understanding and is in agreement with plan.   Final Clinical Impression(s) / ED Diagnoses Final diagnoses:  Acute bacterial conjunctivitis of left eye    Rx / DC Orders ED Discharge Orders          Ordered    trimethoprim-polymyxin b (POLYTRIM) ophthalmic solution  Every 4 hours        08/02/22 2129              Evlyn Courier, PA-C 08/02/22 2132    Davonna Belling, MD 08/02/22 [phone]

## 2022-08-02 NOTE — ED Triage Notes (Signed)
Pt complaining of the left eye being red and draining. Started today.

## 2022-08-02 NOTE — Discharge Instructions (Signed)
Your exam today is overall reassuring.  This is likely bacterial conjunctivitis.  I have sent antibiotic eyedrops into the pharmacy for you.  For any concerning symptoms return to the emergency department otherwise follow-up with your primary care provider.

## 2022-08-09 ENCOUNTER — Other Ambulatory Visit: Payer: Self-pay

## 2022-08-09 ENCOUNTER — Emergency Department (HOSPITAL_COMMUNITY)
Admission: EM | Admit: 2022-08-09 | Discharge: 2022-08-09 | Disposition: A | Discharge status: Home / Self Care | Payer: Medicaid Other | Attending: Emergency Medicine | Admitting: Emergency Medicine

## 2022-08-09 ENCOUNTER — Encounter (HOSPITAL_COMMUNITY): Payer: Self-pay | Admitting: Internal Medicine

## 2022-08-09 DIAGNOSIS — K420 Umbilical hernia with obstruction, without gangrene: Secondary | ICD-10-CM | POA: Diagnosis not present

## 2022-08-09 DIAGNOSIS — X500XXA Overexertion from strenuous movement or load, initial encounter: Secondary | ICD-10-CM | POA: Diagnosis not present

## 2022-08-09 DIAGNOSIS — K429 Umbilical hernia without obstruction or gangrene: Secondary | ICD-10-CM

## 2022-08-09 DIAGNOSIS — R109 Unspecified abdominal pain: Secondary | ICD-10-CM | POA: Diagnosis present

## 2022-08-09 NOTE — ED Triage Notes (Signed)
States he has a protrusion at his belly button that popped out yesterday while at work. States it is not painful but has a burning sensation. He strained at work by pulling & pushing.

## 2022-08-09 NOTE — ED Provider Notes (Signed)
[facility] Provider Note   CSN: [id number] Arrival date & time: 08/09/22  1609     History  No chief complaint on file.   Deveyon Nazari is a 47 y.o. male.  Pt is a 47 yo male with pmhx significant for polysubstance abuse.  He was at work today and and he twisted and pushed something heavy.  He felt something pop out around his belly button.  It feels burning.  Not severe pain.  It does go back in.       Home Medications Prior to Admission medications   Medication Sig Start Date End Date Taking? Authorizing Provider  gabapentin (NEURONTIN) 300 MG capsule Take 1 capsule (300 mg total) by mouth 3 (three) times daily. 07/23/22   Alvira Monday, FNP  tamsulosin (FLOMAX) 0.4 MG CAPS capsule Take 1 capsule (0.4 mg total) by mouth daily. 07/23/22   Alvira Monday, FNP  trimethoprim-polymyxin b (POLYTRIM) ophthalmic solution Place 2 drops into the left eye every 4 (four) hours. 08/02/22   Evlyn Courier, PA-C      Allergies    Patient has no known allergies.    Review of Systems   Review of Systems  Gastrointestinal:        Bump around umbilicus  All other systems reviewed and are negative.   Physical Exam Updated Vital Signs BP (!) 135/95 (BP Location: Right Arm)   Pulse 86   Temp 98.5 F (36.9 C) (Oral)   Resp 18   Ht 6' (1.829 m)   Wt 93 kg   SpO2 97%   BMI 27.80 kg/m  Physical Exam Vitals and nursing note reviewed.  Constitutional:      Appearance: Normal appearance.  HENT:     Head: Normocephalic and atraumatic.     Right Ear: External ear normal.     Left Ear: External ear normal.     Nose: Nose normal.     Mouth/Throat:     Mouth: Mucous membranes are moist.     Pharynx: Oropharynx is clear.  Eyes:     Extraocular Movements: Extraocular movements intact.     Conjunctiva/sclera: Conjunctivae normal.     Pupils: Pupils are equal, round, and reactive to light.  Cardiovascular:     Rate and Rhythm: Normal rate and  regular rhythm.     Pulses: Normal pulses.     Heart sounds: Normal heart sounds.  Pulmonary:     Effort: Pulmonary effort is normal.     Breath sounds: Normal breath sounds.  Abdominal:     General: Abdomen is flat. Bowel sounds are normal.     Palpations: Abdomen is soft.     Hernia: A hernia is present. Hernia is present in the umbilical area.     Comments: Easily reduced small umbilical hernia  Musculoskeletal:        General: Normal range of motion.     Cervical back: Normal range of motion and neck supple.  Skin:    General: Skin is warm.     Capillary Refill: Capillary refill takes less than 2 seconds.  Neurological:     General: No focal deficit present.     Mental Status: He is alert and oriented to person, place, and time.  Psychiatric:        Mood and Affect: Mood normal.        Behavior: Behavior normal.     ED Results / Procedures / Treatments   Labs (all labs ordered  are listed, but only abnormal results are displayed) Labs Reviewed - No data to display  EKG None  Radiology No results found.  Procedures Procedures    Medications Ordered in ED Medications - No data to display  ED Course/ Medical Decision Making/ A&P                             Medical Decision Making  This patient presents to the ED for concern of abd pain, this involves an extensive number of treatment options, and is a complaint that carries with it a high risk of complications and morbidity.  The differential diagnosis includes infection, hernia   Co morbidities that complicate the patient evaluation  Polysubstance abuse   Additional history obtained:  Additional history obtained from epic chart review   I have reviewed the patients home medicines and have made adjustments as needed   Problem List / ED Course:  Umbilical hernia:  easily reducible.  Pt to f/u with surgery as needed.  Social Determinants of Health:  Lives at home   Dispostion:  After  consideration of the diagnostic results and the patients response to treatment, I feel that the patent would benefit from discharge with outpatient f/u.          Final Clinical Impression(s) / ED Diagnoses Final diagnoses:  Umbilical hernia without obstruction and without gangrene    Rx / DC Orders ED Discharge Orders     None         Isla Pence, MD 08/09/22 1704

## 2022-08-22 ENCOUNTER — Other Ambulatory Visit: Payer: Self-pay

## 2022-08-22 ENCOUNTER — Encounter (HOSPITAL_COMMUNITY): Payer: Self-pay

## 2022-08-22 DIAGNOSIS — R0981 Nasal congestion: Secondary | ICD-10-CM | POA: Diagnosis not present

## 2022-08-22 DIAGNOSIS — Z5321 Procedure and treatment not carried out due to patient leaving prior to being seen by health care provider: Secondary | ICD-10-CM | POA: Diagnosis not present

## 2022-08-22 DIAGNOSIS — K429 Umbilical hernia without obstruction or gangrene: Secondary | ICD-10-CM | POA: Insufficient documentation

## 2022-08-22 DIAGNOSIS — R110 Nausea: Secondary | ICD-10-CM | POA: Diagnosis present

## 2022-08-22 NOTE — ED Triage Notes (Signed)
Pt complaining of being congested, and he has an hernia at the umbilicus that is causing him to be nauseated.

## 2022-08-23 ENCOUNTER — Emergency Department (HOSPITAL_COMMUNITY)
Admission: EM | Admit: 2022-08-23 | Discharge: 2022-08-23 | Discharge status: Against Medical Advice | Payer: Medicaid Other | Attending: Emergency Medicine | Admitting: Emergency Medicine

## 2022-08-23 NOTE — ED Notes (Signed)
Pt called several time- no answer, pt not in lobby

## 2022-08-26 ENCOUNTER — Telehealth: Payer: Self-pay

## 2022-08-26 ENCOUNTER — Telehealth: Payer: Self-pay | Admitting: Family Medicine

## 2022-08-26 NOTE — Telephone Encounter (Signed)
[facility] called said received a prescription sent in on 2.13.2024 for Gabapentin filled and now went to another physician in [place] and this provider filled same prescription. Seen at [facility] also, received another refill pharmacy on 03.06.2024.  was too early.  Trying to get it refilled 12 days after, seeing behavorial clinic out of [place].  Do you want to refill gabapentin (NEURONTIN) 300 MG capsule [id number]    Jonna called from [facility], asked to return her call .

## 2022-08-26 NOTE — Telephone Encounter (Signed)
no

## 2022-08-26 NOTE — Telephone Encounter (Signed)
Message sent to [facility] to not fill gabapentin per provider

## 2022-08-26 NOTE — Telephone Encounter (Signed)
See pharmacy message

## 2022-08-26 NOTE — Transitions of Care (Post Inpatient/ED Visit) (Signed)
   08/26/2022  Name: Eric Woods MRN: [id number] DOB: 05/07/1976  Today's TOC FU Call Status: Today's TOC FU Call Status:: Successful TOC FU Call Competed TOC FU Call Complete Date: 08/26/22  Transition Care Management Follow-up Telephone Call Date of Discharge: 08/23/22 Discharge Facility: [facility] Type of Discharge: Emergency Department Reason for ED Visit: Other: (Abdominal Pain) How have you been since you were released from the hospital?: Worse Any questions or concerns?: No  Items Reviewed: Did you receive and understand the discharge instructions provided?: Yes Medications obtained and verified?: Yes (Medications Reviewed) Any new allergies since your discharge?: No Dietary orders reviewed?: Yes Do you have support at home?: Yes  Home Care and Equipment/Supplies: [place] Ordered?: NA Any new equipment or medical supplies ordered?: NA  Functional Questionnaire: Do you need assistance with bathing/showering or dressing?: No Do you need assistance with meal preparation?: No Do you need assistance with eating?: No Do you have difficulty maintaining continence: No Do you need assistance with getting out of bed/getting out of a chair/moving?: No Do you have difficulty managing or taking your medications?: No  Follow up appointments reviewed: PCP Follow-up appointment confirmed?: Yes Date of PCP follow-up appointment?: 08/27/22 Follow-up Provider: Alvira Monday [facility] Follow-up appointment confirmed?: No Do you need transportation to your follow-up appointment?: No Do you understand care options if your condition(s) worsen?: Yes-patient verbalized understanding    [place] LPN [employer] Dial [phone]

## 2022-08-27 ENCOUNTER — Ambulatory Visit: Payer: Medicaid Other | Admitting: Internal Medicine

## 2022-08-27 ENCOUNTER — Encounter: Payer: Self-pay | Admitting: Internal Medicine

## 2022-08-27 VITALS — BP 119/73 | HR 70 | Resp 16 | Ht 72.0 in | Wt 208.8 lb

## 2022-08-27 DIAGNOSIS — L089 Local infection of the skin and subcutaneous tissue, unspecified: Secondary | ICD-10-CM

## 2022-08-27 DIAGNOSIS — G894 Chronic pain syndrome: Secondary | ICD-10-CM | POA: Diagnosis not present

## 2022-08-27 DIAGNOSIS — L0201 Cutaneous abscess of face: Secondary | ICD-10-CM | POA: Diagnosis not present

## 2022-08-27 DIAGNOSIS — G8929 Other chronic pain: Secondary | ICD-10-CM | POA: Insufficient documentation

## 2022-08-27 DIAGNOSIS — K429 Umbilical hernia without obstruction or gangrene: Secondary | ICD-10-CM | POA: Insufficient documentation

## 2022-08-27 DIAGNOSIS — J329 Chronic sinusitis, unspecified: Secondary | ICD-10-CM | POA: Diagnosis not present

## 2022-08-27 MED ORDER — MUPIROCIN 2 % EX OINT: 1.0000 | TOPICAL_OINTMENT | Freq: Two times a day (BID) | CUTANEOUS | 0 refills | Status: DC
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
## 2022-08-27 NOTE — Assessment & Plan Note (Signed)
ER follow-up: Patient reports having a hernia around his umbilical area and he was seen in the ED for this.  He obtained his hernia at work and has been referred to workmen's compensation physicians for further evaluation of this problem.  No further eval or workup today
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
## 2022-08-27 NOTE — Assessment & Plan Note (Addendum)
Patient was admitted to the hospital September 2023 for left knee cellulitis and abscess , left forehead cellulitis and abscess, and left preseptal cellulitis.  Since then he has not had complete resolution.  He has an area on his right forehead today which he has drained of pus a few times.  Also has a area on left forehead which she is drained.  No fever chills or swollen joints.  No IV drug use.   Pustule has been drained.  Prescribed topical mupirocin  Follow up if symptoms worsen or fail to improve

## 2022-08-27 NOTE — Patient Instructions (Signed)
Thank you for trusting me with your care. To recap, today we discussed the following:   1. Pustules  Apply cream twice daily for 7 days.  - mupirocin ointment (BACTROBAN) 2 %; Apply 1 Application topically 2 (two) times daily.  Dispense: 22 g; Refill: 0  2. Chronic sinusitis, unspecified location - Treatment has not improved symptoms. We will send you to ENT for further evaluation - Ambulatory referral to ENT  3. Chronic Pain Follow up with your pain doctors as discussed.

## 2022-09-05 ENCOUNTER — Encounter: Payer: Self-pay | Admitting: Physician Assistant

## 2022-09-05 ENCOUNTER — Ambulatory Visit: Payer: Medicaid Other | Admitting: Physician Assistant

## 2022-09-05 ENCOUNTER — Telehealth (HOSPITAL_COMMUNITY): Payer: Self-pay

## 2022-09-05 VITALS — BP 136/96 | HR 78 | Ht 72.0 in | Wt 201.0 lb

## 2022-09-05 DIAGNOSIS — K219 Gastro-esophageal reflux disease without esophagitis: Secondary | ICD-10-CM | POA: Diagnosis not present

## 2022-09-05 DIAGNOSIS — G629 Polyneuropathy, unspecified: Secondary | ICD-10-CM

## 2022-09-05 DIAGNOSIS — G894 Chronic pain syndrome: Secondary | ICD-10-CM

## 2022-09-05 MED ORDER — MELOXICAM 7.5 MG PO TABS: 7.5000 mg | ORAL_TABLET | Freq: Every day | ORAL | 0 refills | Status: AC

## 2022-09-05 MED ORDER — OMEPRAZOLE 20 MG PO CPDR: 20.0000 mg | DELAYED_RELEASE_CAPSULE | Freq: Every day | ORAL | 3 refills | Status: AC

## 2022-09-05 MED ORDER — GABAPENTIN 400 MG PO CAPS: 400.0000 mg | ORAL_CAPSULE | Freq: Three times a day (TID) | ORAL | 1 refills | Status: DC

## 2022-09-05 NOTE — Patient Instructions (Signed)
You are going to take an increased dose of gabapentin 400 mg 3 times a day.  You will start Mobic 7.5 mg once daily in 2 to 3 days after starting the increase in gabapentin.  I encourage you to also take Prilosec on a daily basis while you are taking the Mobic to help protect your stomach.  I started a referral for you to be seen by pain management.  Please feel free to return to the mobile unit as needed.  Kennieth Rad, PA-C Physician Assistant [facility] [email or web address]   Meloxicam Tablets What is this medication? MELOXICAM (mel OX i cam) treats mild to moderate pain, inflammation, or arthritis. It works by decreasing inflammation. It belongs to a group of medications called NSAIDs. This medicine may be used for other purposes; ask your health care provider or pharmacist if you have questions. COMMON BRAND NAME(S): Mobic What should I tell my care team before I take this medication? They need to know if you have any of these conditions: Asthma (lung or breathing disease) Bleeding disorder Coronary artery bypass graft (CABG) within the past 2 weeks Dehydration Frequently drink alcohol Heart attack Heart disease Heart failure High blood pressure Kidney disease Liver disease Stomach bleeding Stomach ulcers, other stomach or intestine problems Take medications that treat or prevent blood clots Taking other steroids, such as dexamethasone or prednisone Tobacco use An unusual or allergic reaction to meloxicam, other medications, foods, dyes, or preservatives Pregnant or trying to get pregnant Breast-feeding How should I use this medication? Take this medication by mouth. Take it as directed on the prescription label at the same time every day. You can take it with or without food. If it upsets your stomach, take it with food. Do not use it more often than directed. There may be unused or extra doses in the bottle after  you finish your treatment. Talk to your care team if you have questions about your dose. A special MedGuide will be given to you by the pharmacist with each prescription and refill. Be sure to read this information carefully each time. Talk to your care team about the use of this medication in children. Special care may be needed. People over 70 years of age may have a stronger reaction and need a smaller dose. Overdosage: If you think you have taken too much of this medicine contact a poison control center or emergency room at once. NOTE: This medicine is only for you. Do not share this medicine with others. What if I miss a dose? If you miss a dose, take it as soon as you can. If it is almost time for your next dose, take only that dose. Do not take double or extra doses. What may interact with this medication? Do not take this medication with any of the following: Cidofovir Ketorolac This medication may also interact with the following: Alcohol Aspirin and aspirin-like medications Certain medications for blood pressure, heart disease, irregular heart beat Certain medications for mental health conditions Certain medications that treat or prevent blood clots, such as warfarin, enoxaparin, dalteparin, apixaban, dabigatran, rivaroxaban Cyclosporine Diuretics Fluconazole Lithium Methotrexate Other NSAIDs, medications for pain and inflammation, such as ibuprofen and naproxen Pemetrexed This list may not describe all possible interactions. Give your health care provider a list of all the medicines, herbs, non-prescription drugs, or dietary supplements you use. Also tell them if you smoke, drink alcohol, or use illegal drugs. Some items may interact with your medicine. What should I watch  for while using this medication? Visit your care team for regular checks on your progress. Tell your care team if your symptoms do not start to get better or if they get worse. Do not take other medications  that contain aspirin, ibuprofen, or naproxen with this medication. Side effects such as stomach upset, nausea, or ulcers may be more likely to occur. Many non-prescription medications contain aspirin, ibuprofen, or naproxen. Always read labels carefully. This medication can cause serious ulcers and bleeding in the stomach. It can happen with no warning. Tobacco, alcohol, older age, and poor health can also increase risks. Call your care team right away if you have stomach pain or blood in your vomit or stool. This medication does not prevent a heart attack or stroke. This medication may increase the chance of a heart attack or stroke. The chance may increase the longer you use this medication or if you have heart disease. If you take aspirin to prevent a heart attack or stroke, talk to your care team about using this medication. This medication may cause serious skin reactions. They can happen weeks to months after starting the medication. Contact your care team right away if you notice fevers or flu-like symptoms with a rash. The rash may be red or purple and then turn into blisters or peeling of the skin. Or, you might notice a red rash with swelling of the face, lips or lymph nodes in your neck or under your arms. Talk to your care team if you wish to become pregnant or think you might be pregnant. This medication can cause serious birth defects. This medication may affect your coordination, reaction time, or judgment. Do not drive or operate machinery until you know how this medication affects you. Sit up or stand slowly to reduce the risk of dizzy or fainting spells. Drinking alcohol with this medication can increase the risk of these side effects. Be careful brushing or flossing your teeth or using a toothpick because you may get an infection or bleed more easily. If you have any dental work done, tell your dentist you are receiving this medication. This medication may make it more difficult to get  pregnant. Talk to your care team if you are concerned about your fertility. What side effects may I notice from receiving this medication? Side effects that you should report to your care team as soon as possible: Allergic reactions--skin rash, itching, hives, swelling of the face, lips, tongue, or throat Bleeding--bloody or black, tar-like stools, vomiting blood or brown material that looks like coffee grounds, red or dark brown urine, small red or purple spots on skin, unusual bruising or bleeding Heart attack--pain or tightness in the chest, shoulders, arms, or jaw, nausea, shortness of breath, cold or clammy skin, feeling faint or lightheaded Heart failure--shortness of breath, swelling of ankles, feet, or hands, sudden weight gain, unusual weakness or fatigue Increase in blood pressure Kidney injury--decrease in the amount of urine, swelling of the ankles, hands, or feet Liver injury--right upper belly pain, loss of appetite, nausea, light-colored stool, dark yellow or brown urine, yellowing skin or eyes, unusual weakness or fatigue Rash, fever, and swollen lymph nodes Redness, blistering, peeling, or loosening of the skin, including inside the mouth Stroke--sudden numbness or weakness of the face, arm, or leg, trouble speaking, confusion, trouble walking, loss of balance or coordination, dizziness, severe headache, change in vision Side effects that usually do not require medical attention (report to your care team if they continue or are bothersome):  Diarrhea Nausea Upset stomach This list may not describe all possible side effects. Call your doctor for medical advice about side effects. You may report side effects to FDA at 1-800-FDA-1088. Where should I keep my medication? Keep out of the reach of children and pets. Store at room temperature between 20 and 25 degrees C (68 and 77 degrees F). Protect from moisture. Keep the container tightly closed. Get rid of any unused medication after  the expiration date. To get rid of medications that are no longer needed or have expired: Take the medication to a medication take-back program. Check with your pharmacy or law enforcement to find a location. If you cannot return the medication, check the label or package insert to see if the medication should be thrown out in the garbage or flushed down the toilet. If you are not sure, ask your care team. If it is safe to put it in the trash, empty the medication out of the container. Mix the medication with cat litter, dirt, coffee grounds, or other unwanted substance. Seal the mixture in a bag or container. Put it in the trash. NOTE: This sheet is a summary. It may not cover all possible information. If you have questions about this medicine, talk to your doctor, pharmacist, or health care provider.  2023 Elsevier/Gold Standard (2020-08-09 00:00:00)

## 2022-09-05 NOTE — Telephone Encounter (Signed)
Pt had self presented to ED registration asking for documentation about a previous visit and left his call back number. Pt was contacted via telephone and identity confirmed by full name and DOB. Pt provided with the number to medical records [phone]) to receive the documentation that he was requesting.
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
## 2022-09-20 LAB — LAB REPORT - SCANNED
EGFR (Non-African Amer.): 109
EGFR (Non-African Amer.): 90

## 2022-09-20 LAB — HM HEPATITIS C SCREENING LAB: HM Hepatitis Screen: NEGATIVE

## 2022-09-23 ENCOUNTER — Encounter (HOSPITAL_COMMUNITY): Payer: Self-pay | Admitting: Internal Medicine

## 2022-09-30 ENCOUNTER — Telehealth: Payer: Self-pay | Admitting: *Deleted

## 2022-09-30 NOTE — Telephone Encounter (Signed)
  Procedure: Colonoscopy  Height: 6'0 Weight: 205lbs       Have you had a colonoscopy before?  no  Do you have family history of colon cancer?  no  Do you have a family history of polyps? no  Previous colonoscopy with polyps removed? no  Do you have a history colorectal cancer?   no  Are you diabetic?  no  Do you have a prosthetic or mechanical heart valve? no  Do you have a pacemaker/defibrillator?   no  Have you had endocarditis/atrial fibrillation?  no  Do you use supplemental oxygen/CPAP?  no  Have you had joint replacement within the last 12 months?  no  Do you tend to be constipated or have to use laxatives?  no   Do you have history of alcohol use? If yes, how much and how often.  no  Do you have history or are you using drugs? If yes, what do are you  using?  no  Have you ever had a stroke/heart attack?  no  Have you ever had a heart or other vascular stent placed,?no  Do you take weight loss medication? no  Do you take any blood-thinning medications such as: (Plavix, aspirin, Coumadin, Aggrenox, Brilinta, Xarelto, Eliquis, Pradaxa, Savaysa or Effient)? no  If yes we need the name, milligram, dosage and who is prescribing doctor:               Current Outpatient Medications  Medication Sig Dispense Refill   Buprenorphine HCl-Naloxone HCl (SUBOXONE) 8-2 MG FILM Place under the tongue.     gabapentin (NEURONTIN) 400 MG capsule Take 1 capsule (400 mg total) by mouth 3 (three) times daily. 90 capsule 1   meloxicam (MOBIC) 7.5 MG tablet Take 1 tablet (7.5 mg total) by mouth daily. 30 tablet 0   omeprazole (PRILOSEC) 20 MG capsule Take 1 capsule (20 mg total) by mouth daily. 30 capsule 3   No current facility-administered medications for this visit.    No Known Allergies

## 2022-10-05 ENCOUNTER — Other Ambulatory Visit: Payer: Self-pay

## 2022-10-05 ENCOUNTER — Encounter (HOSPITAL_COMMUNITY): Payer: Self-pay | Admitting: Emergency Medicine

## 2022-10-05 ENCOUNTER — Emergency Department (HOSPITAL_COMMUNITY)
Admission: EM | Admit: 2022-10-05 | Discharge: 2022-10-05 | Disposition: A | Discharge status: Home / Self Care | Payer: Medicaid Other | Attending: Emergency Medicine | Admitting: Emergency Medicine

## 2022-10-05 DIAGNOSIS — A4902 Methicillin resistant Staphylococcus aureus infection, unspecified site: Secondary | ICD-10-CM | POA: Diagnosis not present

## 2022-10-05 DIAGNOSIS — K1379 Other lesions of oral mucosa: Secondary | ICD-10-CM | POA: Diagnosis present

## 2022-10-05 MED ORDER — MUPIROCIN CALCIUM 2 % EX CREA: 1.0000 | TOPICAL_CREAM | Freq: Two times a day (BID) | CUTANEOUS | 0 refills | Status: DC

## 2022-10-05 MED ORDER — IBUPROFEN 800 MG PO TABS: 800.0000 mg | ORAL_TABLET | Freq: Three times a day (TID) | ORAL | 0 refills | Status: AC

## 2022-10-05 MED ORDER — IBUPROFEN 800 MG PO TABS
800.0000 mg | ORAL_TABLET | Freq: Once | ORAL | Status: AC
  Administered 2022-10-05: 800 mg via ORAL
  Filled 2022-10-05: qty 1

## 2022-10-05 MED ORDER — DOXYCYCLINE HYCLATE 100 MG PO CAPS: 100.0000 mg | ORAL_CAPSULE | Freq: Two times a day (BID) | ORAL | 0 refills | Status: DC

## 2022-10-05 MED ORDER — DOXYCYCLINE HYCLATE 100 MG PO TABS
100.0000 mg | ORAL_TABLET | Freq: Once | ORAL | Status: AC
  Administered 2022-10-05: 100 mg via ORAL
  Filled 2022-10-05: qty 1

## 2022-10-05 NOTE — Discharge Instructions (Signed)
Take the entire course of the antibiotics prescribed.  Apply warm compresses several times daily as discussed.  Plan followup with your primary MD if not improving over the next week.

## 2022-10-05 NOTE — ED Triage Notes (Signed)
Pt endorses mouth pain for a few days. Pt has sores around his mouth. Swelling noted to left side of face. Endorses facial pressure.

## 2022-10-06 NOTE — ED Provider Notes (Signed)
[place] EMERGENCY DEPARTMENT AT [facility] Provider Note   CSN: [id number] Arrival date & time: 10/05/22  1530     History {Add pertinent medical, surgical, social history, OB history to HPI:1} Chief Complaint  Patient presents with  . Mouth Lesions    Eric Woods is a 47 y.o. male.  The history is provided by the patient.       Home Medications Prior to Admission medications   Medication Sig Start Date End Date Taking? Authorizing Provider  doxycycline (VIBRAMYCIN) 100 MG capsule Take 1 capsule (100 mg total) by mouth 2 (two) times daily. 10/05/22  Yes Keyaria Lawson, Raynelle Fanning, PA-C  ibuprofen (ADVIL) 800 MG tablet Take 1 tablet (800 mg total) by mouth 3 (three) times daily. 10/05/22  Yes Fain Francis, Raynelle Fanning, PA-C  mupirocin cream (BACTROBAN) 2 % Apply 1 Application topically 2 (two) times daily. 10/05/22  Yes Eliud Polo, Raynelle Fanning, PA-C  Buprenorphine HCl-Naloxone HCl (SUBOXONE) 8-2 MG FILM Place under the tongue.    [provider]  gabapentin (NEURONTIN) 400 MG capsule Take 1 capsule (400 mg total) by mouth 3 (three) times daily. 09/05/22   Mayers, Cari S, PA-C  meloxicam (MOBIC) 7.5 MG tablet Take 1 tablet (7.5 mg total) by mouth daily. 09/05/22   Mayers, Cari S, PA-C  omeprazole (PRILOSEC) 20 MG capsule Take 1 capsule (20 mg total) by mouth daily. 09/05/22   Mayers, Cari S, PA-C      Allergies    Patient has no known allergies.    Review of Systems   Review of Systems  Physical Exam Updated Vital Signs BP 124/83 (BP Location: Right Arm)   Pulse 84   Temp 98 F (36.7 C)   Resp 18   Ht 6' (1.829 m)   Wt 93 kg   SpO2 100%   BMI 27.80 kg/m  Physical Exam  ED Results / Procedures / Treatments   Labs (all labs ordered are listed, but only abnormal results are displayed) Labs Reviewed - No data to display  EKG None  Radiology No results found.  Procedures Procedures  {Document cardiac monitor, telemetry assessment procedure when appropriate:1}  Medications  Ordered in ED Medications  doxycycline (VIBRA-TABS) tablet 100 mg (100 mg Oral Given 10/05/22 1622)  ibuprofen (ADVIL) tablet 800 mg (800 mg Oral Given 10/05/22 1622)    ED Course/ Medical Decision Making/ A&P   {   Click here for ABCD2, HEART and other calculatorsREFRESH Note before signing :1}                          Medical Decision Making Risk Prescription drug management.   ***  {Document critical care time when appropriate:1} {Document review of labs and clinical decision tools ie heart score, Chads2Vasc2 etc:1}  {Document your independent review of radiology images, and any outside records:1} {Document your discussion with family members, caretakers, and with consultants:1} {Document social determinants of health affecting pt's care:1} {Document your decision making why or why not admission, treatments were needed:1} Final Clinical Impression(s) / ED Diagnoses Final diagnoses:  MRSA (methicillin resistant Staphylococcus aureus) infection    Rx / DC Orders ED Discharge Orders          Ordered    doxycycline (VIBRAMYCIN) 100 MG capsule  2 times daily        10/05/22 1605    ibuprofen (ADVIL) 800 MG tablet  3 times daily        10/05/22 1605    mupirocin  cream (BACTROBAN) 2 %  2 times daily        10/05/22 1605

## 2022-10-07 ENCOUNTER — Telehealth: Payer: Self-pay

## 2022-10-07 NOTE — Transitions of Care (Post Inpatient/ED Visit) (Signed)
   10/09/2022  Name: Eric Woods MRN: [id number] DOB: July 25, 1975  Today's TOC FU Call Status: Today's TOC FU Call Status:: Successful TOC FU Call Competed Unsuccessful Call (1st Attempt) Date: 10/07/22 Unsuccessful Call (2nd Attempt) Date: 10/08/22  Attempted to reach the patient regarding the most recent Inpatient/ED visit.  Follow Up Plan: Additional outreach attempts will be made to reach the patient to complete the Transitions of Care (Post Inpatient/ED visit) call.   Signature  Annabell Sabal [facility] Float Pool

## 2022-10-21 ENCOUNTER — Other Ambulatory Visit: Payer: Self-pay | Admitting: Internal Medicine

## 2022-10-21 MED ORDER — VITAMIN D3 25 MCG (1000 UT) PO CAPS: 1000.0000 [IU] | ORAL_CAPSULE | Freq: Every day | ORAL | 1 refills | Status: AC

## 2022-10-21 NOTE — Telephone Encounter (Signed)
ASA 2. Appropriate.  ?

## 2022-10-22 NOTE — Telephone Encounter (Signed)
Spoke with pt. He is going to see his doctor tomorrow to be referred to a surgeon to have surgery done on his hernia that he is having issues with. He stated once this is taken care of, he will call to schedule procedure.

## 2022-10-22 NOTE — Telephone Encounter (Signed)
Noted on referral for PCP

## 2022-10-23 ENCOUNTER — Encounter: Payer: Self-pay | Admitting: Family Medicine

## 2022-10-23 ENCOUNTER — Ambulatory Visit: Payer: Medicaid Other | Admitting: Family Medicine

## 2022-10-23 VITALS — BP 123/76 | HR 68 | Ht 72.0 in | Wt 202.1 lb

## 2022-10-23 DIAGNOSIS — G629 Polyneuropathy, unspecified: Secondary | ICD-10-CM

## 2022-10-23 DIAGNOSIS — K429 Umbilical hernia without obstruction or gangrene: Secondary | ICD-10-CM | POA: Diagnosis not present

## 2022-10-23 DIAGNOSIS — G894 Chronic pain syndrome: Secondary | ICD-10-CM

## 2022-10-23 DIAGNOSIS — R1900 Intra-abdominal and pelvic swelling, mass and lump, unspecified site: Secondary | ICD-10-CM | POA: Diagnosis not present

## 2022-10-23 MED ORDER — GABAPENTIN 400 MG PO CAPS
400.0000 mg | ORAL_CAPSULE | Freq: Three times a day (TID) | ORAL | 1 refills | Status: DC
Start: 2022-10-23 — End: 2022-11-19

## 2022-10-23 NOTE — Assessment & Plan Note (Signed)
Reports obtaining hernia at work and will like surgical intervention for hernia repair Will get imaging study of his hernia today  Referral to general surgery will be placed once imaging studies are reviewed Complains of a burning sensation at the affected site No pain reported but note umbilical discomfort No fever or chills reported Palpable bulge noted at the umbilicus Normal bowel movements noted

## 2022-10-23 NOTE — Assessment & Plan Note (Addendum)
Reports follow-up with Dr. Marin Comment at [facility] in [place] for chronic pain but would like a referral closer for pain management He receives his Subutex from [facility] will trial behavior resources Reports that he is in continuous pain Pain is rated 8 out of 10 on most days He reports some relief of his symptoms with gabapentin and requested refill today Refill sent to his pharmacy Referral placed to pain management

## 2022-10-23 NOTE — Patient Instructions (Addendum)
I appreciate the opportunity to provide care to you today!    Follow up:  3 months  Please pick up your prescription at the pharmacy  Please stop by [facility] hospital anytime to get a CT scan of your abdomen  Referral: pain management   Please continue to a heart-healthy diet and increase your physical activities. Try to exercise for at least five days a week.      It was a pleasure to see you and I look forward to continuing to work together on your health and well-being. Please do not hesitate to call the office if you need care or have questions about your care.   Have a wonderful day and week. With Gratitude, Gilmore Laroche MSN, FNP-BC

## 2022-10-23 NOTE — Progress Notes (Signed)
Established Patient Office Visit  Subjective:  Patient ID: Eric Woods, male    DOB: 05/20/1976  Age: 47 y.o. MRN: [id number]  CC:  Chief Complaint  Patient presents with   Chronic Care Management    3 month f/u, pt reports hernia needs a referral to [facility] Needs a refill on on gabapentin.    HPI Eric Woods is a 47 y.o. male with past medical history of neuropathy, and  chronic pain presents for f/u. For the details of today's visit, please refer to the assessment and plan.     Past Medical History:  Diagnosis Date   Infection of skin due to methicillin resistant Staphylococcus aureus (MRSA)    Polysubstance abuse (HCC)    Tobacco dependence     Past Surgical History:  Procedure Laterality Date   ABDOMINAL SURGERY     HAND SURGERY     I & D EXTREMITY Right 12/19/2021   Procedure: IRRIGATION AND DEBRIDEMENT EXTREMITY;  Surgeon: Gomez Cleverly, MD;  Location: [facility] OR;  Service: Orthopedics;  Laterality: Right;   IRRIGATION AND DEBRIDEMENT KNEE Left 02/17/2022   Procedure: IRRIGATION AND DEBRIDEMENT KNEE;  Surgeon: Bjorn Pippin, MD;  Location: [facility] OR;  Service: Orthopedics;  Laterality: Left;   TEE WITHOUT CARDIOVERSION N/A 02/18/2022   Procedure: TRANSESOPHAGEAL ECHOCARDIOGRAM (TEE);  Surgeon: Chrystie Nose, MD;  Location: [facility] ENDOSCOPY;  Service: Cardiovascular;  Laterality: N/A;    History reviewed. No pertinent family history.  Social History   Socioeconomic History   Marital status: Single    Spouse name: Not on file   Number of children: Not on file   Years of education: Not on file   Highest education level: Not on file  Occupational History   Occupation: unemployed  Tobacco Use   Smoking status: Some Days    Types: E-cigarettes   Smokeless tobacco: Not on file   Tobacco comments:    2 packs a week  Vaping Use   Vaping Use: Every day  Substance and Sexual Activity   Alcohol use: Yes    Comment: not his drug of choice   Drug use: Not Currently     Types: Marijuana   Sexual activity: Yes  Other Topics Concern   Not on file  Social History Narrative   Not on file   Social Determinants of Health   Financial Resource Strain: Not on file  Food Insecurity: No Food Insecurity (02/15/2022)   Hunger Vital Sign    Worried About Running Out of Food in the Last Year: Never true    Ran Out of Food in the Last Year: Never true  Transportation Needs: No Transportation Needs (02/15/2022)   PRAPARE - Administrator, Civil Service (Medical): No    Lack of Transportation (Non-Medical): No  Physical Activity: Not on file  Stress: Not on file  Social Connections: Not on file  Intimate Partner Violence: Not At Risk (02/15/2022)   Humiliation, Afraid, Rape, and Kick questionnaire    Fear of Current or Ex-Partner: No    Emotionally Abused: No    Physically Abused: No    Sexually Abused: No    Outpatient Medications Prior to Visit  Medication Sig Dispense Refill   Buprenorphine HCl-Naloxone HCl (SUBOXONE) 8-2 MG FILM Place under the tongue.     Cholecalciferol (VITAMIN D3) 25 MCG (1000 UT) CAPS Take 1 capsule (1,000 Units total) by mouth daily. 60 capsule 1   ibuprofen (ADVIL) 800 MG tablet Take 1 tablet (800 mg  total) by mouth 3 (three) times daily. 21 tablet 0   meloxicam (MOBIC) 7.5 MG tablet Take 1 tablet (7.5 mg total) by mouth daily. 30 tablet 0   mupirocin cream (BACTROBAN) 2 % Apply 1 Application topically 2 (two) times daily. 15 g 0   omeprazole (PRILOSEC) 20 MG capsule Take 1 capsule (20 mg total) by mouth daily. 30 capsule 3   doxycycline (VIBRAMYCIN) 100 MG capsule Take 1 capsule (100 mg total) by mouth 2 (two) times daily. (Patient not taking: Reported on 10/23/2022) 20 capsule 0   gabapentin (NEURONTIN) 400 MG capsule Take 1 capsule (400 mg total) by mouth 3 (three) times daily. (Patient not taking: Reported on 10/23/2022) 90 capsule 1   No facility-administered medications prior to visit.    No Known  Allergies  ROS Review of Systems  Constitutional:  Negative for fatigue and fever.  Eyes:  Negative for visual disturbance.  Respiratory:  Negative for chest tightness and shortness of breath.   Cardiovascular:  Negative for chest pain and palpitations.  Neurological:  Negative for dizziness and headaches.      Objective:    Physical Exam HENT:     Head: Normocephalic.     Right Ear: External ear normal.     Left Ear: External ear normal.     Nose: No congestion or rhinorrhea.     Mouth/Throat:     Mouth: Mucous membranes are moist.  Cardiovascular:     Rate and Rhythm: Regular rhythm.     Heart sounds: No murmur heard. Pulmonary:     Effort: No respiratory distress.     Breath sounds: Normal breath sounds.  Abdominal:     Hernia: A hernia is present.  Neurological:     Mental Status: He is alert.     BP 123/76   Pulse 68   Ht 6' (1.829 m)   Wt 202 lb 1.9 oz (91.7 kg)   SpO2 91%   BMI 27.41 kg/m  Wt Readings from Last 3 Encounters:  10/23/22 202 lb 1.9 oz (91.7 kg)  10/05/22 205 lb (93 kg)  09/05/22 201 lb (91.2 kg)    Lab Results  Component Value Date   TSH 1.443 12/24/2021   Lab Results  Component Value Date   WBC 6.2 06/01/2022   HGB 13.6 06/01/2022   HCT 39.8 06/01/2022   MCV 94.8 06/01/2022   PLT 323 06/01/2022   Lab Results  Component Value Date   NA 138 06/01/2022   K 3.8 06/01/2022   CO2 27 06/01/2022   GLUCOSE 120 (H) 06/01/2022   BUN 11 06/01/2022   CREATININE 0.94 06/01/2022   BILITOT 0.6 06/01/2022   ALKPHOS 51 06/01/2022   AST 33 06/01/2022   ALT 22 06/01/2022   PROT 6.9 06/01/2022   ALBUMIN 4.0 06/01/2022   CALCIUM 9.1 06/01/2022   ANIONGAP 6 06/01/2022   No results found for: "CHOL" No results found for: "HDL" No results found for: "LDLCALC" No results found for: "TRIG" No results found for: "CHOLHDL" No results found for: "HGBA1C"    Assessment & Plan:  Umbilical hernia without obstruction and without  gangrene Assessment & Plan: Reports obtaining hernia at work and will like surgical intervention for hernia repair Will get imaging study of his hernia today  Referral to general surgery will be placed once imaging studies are reviewed Complains of a burning sensation at the affected site No pain reported but note umbilical discomfort No fever or chills reported Palpable bulge noted  at the umbilicus Normal bowel movements noted   Abdominal wall bulge -     CT ABDOMEN PELVIS WO CONTRAST  Chronic pain syndrome Assessment & Plan: Reports follow-up with Dr. Marin Comment at [facility] in [place] for chronic pain but would like a referral closer for pain management He receives his Subutex from [facility] Bee will trial behavior resources Reports that he is in continuous pain Pain is rated 8 out of 10 on most days He reports some relief of his symptoms with gabapentin and requested refill today Refill sent to his pharmacy Referral placed to pain management   Orders: -     Gabapentin; Take 1 capsule (400 mg total) by mouth 3 (three) times daily.  Dispense: 90 capsule; Refill: 1 -     Ambulatory referral to Pain Clinic  Neuropathy -     Gabapentin; Take 1 capsule (400 mg total) by mouth 3 (three) times daily.  Dispense: 90 capsule; Refill: 1    Follow-up: Return in about 3 months (around 01/23/2023).   Gilmore Laroche, FNP

## 2022-10-31 ENCOUNTER — Other Ambulatory Visit: Payer: Self-pay | Admitting: Family Medicine

## 2022-10-31 DIAGNOSIS — K429 Umbilical hernia without obstruction or gangrene: Secondary | ICD-10-CM

## 2022-11-03 ENCOUNTER — Ambulatory Visit (HOSPITAL_BASED_OUTPATIENT_CLINIC_OR_DEPARTMENT_OTHER): Payer: Medicaid Other

## 2022-11-05 ENCOUNTER — Telehealth: Payer: Self-pay | Admitting: Family Medicine

## 2022-11-05 NOTE — Telephone Encounter (Signed)
Patient mother Gunnar Fusi called to let provider know recommend coloscopy wait til after surgery, surgeon [facility] on Monday, he is not able to nauseas a eat small amount of food, GI doctor needs a letter to be at [facility] Dr Lavonia Dana, [address] [place], [place] [address]. Phone # [phone]  Fax #: [phone]       Call paula back at [phone] for more detail information.     2. Surgeon also needs medical records fax to them. Will come by our to sign release  Dr Romona Curls / [address] [place], [place] [address] phone # [phone]. fax # [phone].    Patient consultation is Monday, 06.03.2024.

## 2022-11-05 NOTE — Telephone Encounter (Signed)
Tried calling back no vm set up.

## 2022-11-06 NOTE — Telephone Encounter (Signed)
Patient returning a call at [phone]  or [phone]

## 2022-11-06 NOTE — Telephone Encounter (Signed)
Pt called in regard to GI referral. Has not heard anything in regard. Needs sent to    GI doctor needs a letter to be at [facility] Dr Lavonia Dana, [address] [place], [place] [address]. Phone # [phone]  Fax #: [phone]   Wants a call back in regard.

## 2022-11-07 ENCOUNTER — Other Ambulatory Visit: Payer: Self-pay

## 2022-11-07 DIAGNOSIS — Z1211 Encounter for screening for malignant neoplasm of colon: Secondary | ICD-10-CM

## 2022-11-07 NOTE — Telephone Encounter (Signed)
Office visit notes with pcp have been faxed to Dr. Roanna Raider.

## 2022-11-07 NOTE — Telephone Encounter (Signed)
Referral has been placed to GI doctor at [facility] Dr Lavonia Dana, [address] [place], [place] [address]. Phone # [phone]  Fax #: [phone]

## 2022-11-07 NOTE — Telephone Encounter (Signed)
Spoke to mother of pt she is needing medical records or office visit notes faxed to Dr. Roanna Raider [phone]  Also requesting a referral to be sent to Dr. Lavonia Dana for a colonoscopy screening to be done after surgery.

## 2022-11-14 ENCOUNTER — Telehealth: Payer: Self-pay | Admitting: Family Medicine

## 2022-11-14 NOTE — Telephone Encounter (Signed)
Pts wife called and requested a referral to [facility] in [place] for ultrasound for husband Perelman)

## 2022-11-15 NOTE — Telephone Encounter (Signed)
Please inquire about the purpose of the ultrasound.

## 2022-11-18 ENCOUNTER — Other Ambulatory Visit: Payer: Self-pay

## 2022-11-18 ENCOUNTER — Emergency Department (HOSPITAL_COMMUNITY)
Admission: EM | Admit: 2022-11-18 | Discharge: 2022-11-18 | Disposition: A | Discharge status: Home / Self Care | Payer: Medicaid Other | Attending: Emergency Medicine | Admitting: Emergency Medicine

## 2022-11-18 ENCOUNTER — Emergency Department (HOSPITAL_COMMUNITY): Payer: Medicaid Other

## 2022-11-18 DIAGNOSIS — M9272 Juvenile osteochondrosis of metatarsus, left foot: Secondary | ICD-10-CM | POA: Insufficient documentation

## 2022-11-18 DIAGNOSIS — M79675 Pain in left toe(s): Secondary | ICD-10-CM

## 2022-11-18 NOTE — Discharge Instructions (Addendum)
Thank you for letting us take care of you today.  We saw a chronic deformity on your x-ray that we discussed with orthopedics, Dr. Dallas Schimke. He said this has likely always been there and we do not need to admit you to the hospital but you can follow up in the office as needed for continued pain or other symptoms. I provided his information.   You may bear weight on the foot but we provided with crutches as needed short term. Do not use these for more than a few days without being re-evaluated by orthopedics.   For new or worsening symptoms such as inability to bear weight, numbness or tingling, coolness of the extremity, fever, or other concerns, return to nearest ED for re-evaluation.

## 2022-11-18 NOTE — ED Provider Notes (Signed)
[place] EMERGENCY DEPARTMENT AT [facility] Provider Note   CSN: [id number] Arrival date & time: 11/18/22  1508     History  Chief Complaint  Patient presents with   Foot Pain    Eric Woods is a 47 y.o. male with past medical history of polysubstance abuse who presents to the ED complaining of left second toe pain.  No history of diabetes.  No injury to the foot.  He states that it started a few days ago and has become increasingly painful with difficulty with weightbearing at this time.  No associated fever, chills, or other symptoms.      Home Medications Prior to Admission medications   Medication Sig Start Date End Date Taking? Authorizing Provider  Buprenorphine HCl-Naloxone HCl (SUBOXONE) 8-2 MG FILM Place under the tongue.    [provider]  Cholecalciferol (VITAMIN D3) 25 MCG (1000 UT) CAPS Take 1 capsule (1,000 Units total) by mouth daily. 10/21/22   Gardenia Phlegm, MD  gabapentin (NEURONTIN) 400 MG capsule Take 1 capsule (400 mg total) by mouth 3 (three) times daily. 10/23/22   Gilmore Laroche, FNP  ibuprofen (ADVIL) 800 MG tablet Take 1 tablet (800 mg total) by mouth 3 (three) times daily. 10/05/22   Burgess Amor, PA-C  meloxicam (MOBIC) 7.5 MG tablet Take 1 tablet (7.5 mg total) by mouth daily. 09/05/22   Mayers, Cari S, PA-C  mupirocin cream (BACTROBAN) 2 % Apply 1 Application topically 2 (two) times daily. 10/05/22   Burgess Amor, PA-C  omeprazole (PRILOSEC) 20 MG capsule Take 1 capsule (20 mg total) by mouth daily. 09/05/22   Mayers, Cari S, PA-C      Allergies    Patient has no known allergies.    Review of Systems   Review of Systems  All other systems reviewed and are negative.   Physical Exam Updated Vital Signs BP 117/70 (BP Location: Right Arm)   Pulse 89   Resp 20   SpO2 95%  Physical Exam Vitals and nursing note reviewed.  Constitutional:      General: He is not in acute distress.    Appearance: Normal appearance. He is not  ill-appearing or toxic-appearing.  HENT:     Head: Normocephalic and atraumatic.     Mouth/Throat:     Mouth: Mucous membranes are moist.  Eyes:     Conjunctiva/sclera: Conjunctivae normal.  Cardiovascular:     Rate and Rhythm: Normal rate and regular rhythm.     Heart sounds: No murmur heard. Pulmonary:     Effort: Pulmonary effort is normal.     Breath sounds: Normal breath sounds.  Abdominal:     General: Abdomen is flat.     Palpations: Abdomen is soft.  Musculoskeletal:     Cervical back: Normal range of motion and neck supple.     Comments: Moderate tenderness to the medial aspect of the left second toe, overlying area of thickened skin, no erythema over the wounds or streaking, no open wound or drainage, compartments of the lower extremity are soft, 2+ DP and PT pulses, normal sensation, range of motion and strength appear intact, joints are stable, extremity warm and appears well perfused  Skin:    General: Skin is warm and dry.     Capillary Refill: Capillary refill takes less than 2 seconds.  Neurological:     Mental Status: He is alert. Mental status is at baseline.  Psychiatric:        Behavior: Behavior normal.  ED Results / Procedures / Treatments   Labs (all labs ordered are listed, but only abnormal results are displayed) Labs Reviewed - No data to display  EKG None  Radiology DG Foot 2 Views Left  Result Date: 11/18/2022 CLINICAL DATA:  Left foot pain for 3 days, primarily on second toe. No known injury. EXAM: LEFT FOOT - 2 VIEW COMPARISON:  Left foot radiographs 05/21/2014 FINDINGS: Mild dorsal talonavicular and navicular-cuneiform degenerative osteophytosis. Mild lateral great toe metatarsophalangeal degenerative osteophytosis, unchanged. There is mild flattening of the second metatarsal head which is unchanged from 05/21/2014. Mild subchondral sclerosis is also unchanged but there is a new minimal 3 mm transverse dimension and 1 mm depth lucency within  the subchondral second metatarsal head, possible mild degenerative cyst. No acute fracture or dislocation. IMPRESSION: Mild flattening of the second metatarsal head is unchanged from 05/21/2014. There is a new minimal subchondral lucency. The findings are suspicious for early developing (stage I) Freiberg infraction of the second metatarsal head (thought to be the sequela of repetitive injury/vascular compromise). Electronically Signed   By: Neita Garnet M.D.   On: 11/18/2022 17:02    Procedures Procedures    Medications Ordered in ED Medications - No data to display  ED Course/ Medical Decision Making/ A&P                             Medical Decision Making Amount and/or Complexity of Data Reviewed Radiology: ordered. Decision-making details documented in ED Course.   Medical Decision Making:   Eric Woods is a 47 y.o. male who presented to the ED today with left toe pain detailed above.    Additional history discussed with patient's family/caregivers.  Patient's presentation is complicated by their history of substance use.  Complete initial physical exam performed, notably the patient was in NAD. Moderate tenderness to the medial aspect of the left second toe, overlying area of thickened skin, no erythema over the wounds or streaking, no open wound or drainage, compartments of the lower extremity are soft, 2+ DP and PT pulses, normal sensation, range of motion and strength appear intact, joints are stable.    Reviewed and confirmed nursing documentation for past medical history, family history, social history.    Initial Assessment:   With the patient's presentation, differential diagnosis includes but is not limited to fracture, dislocation, sprain, strain, compartment syndrome, ulcer.  This is most consistent with an acute complicated illness  Initial Plan:  XR to evaluate for bony injury Objective evaluation as below reviewed   Initial Study Results:   Radiology:  All  images reviewed independently. Agree with radiology report at this time.   DG Foot 2 Views Left  Result Date: 11/18/2022 CLINICAL DATA:  Left foot pain for 3 days, primarily on second toe. No known injury. EXAM: LEFT FOOT - 2 VIEW COMPARISON:  Left foot radiographs 05/21/2014 FINDINGS: Mild dorsal talonavicular and navicular-cuneiform degenerative osteophytosis. Mild lateral great toe metatarsophalangeal degenerative osteophytosis, unchanged. There is mild flattening of the second metatarsal head which is unchanged from 05/21/2014. Mild subchondral sclerosis is also unchanged but there is a new minimal 3 mm transverse dimension and 1 mm depth lucency within the subchondral second metatarsal head, possible mild degenerative cyst. No acute fracture or dislocation. IMPRESSION: Mild flattening of the second metatarsal head is unchanged from 05/21/2014. There is a new minimal subchondral lucency. The findings are suspicious for early developing (stage I) Freiberg infraction of the second  metatarsal head (thought to be the sequela of repetitive injury/vascular compromise). Electronically Signed   By: Neita Garnet M.D.   On: 11/18/2022 17:02      Consults: Case discussed with Dr. Dallas Schimke with orthopedics who recommended outpatient follow up in office as needed, nothing to do in ED.   Final Assessment and Plan:   4-year-old male presents to the ED complaining of left second toe pain.  He is neurovascularly intact.  Soft compartments.  Range of motion appears intact.  No significant overlying skin changes, erythema or warmth.  Do not suspect cellulitis.  X-ray with suspected chronic deformity.  Discussed with attending physician who cosigned this note and we are both unfamiliar with this finding so it was discussed with orthopedics as above.  They recommended outpatient follow-up as needed.  No emergent intervention required.  Patient given strict ED return precautions, all questions answered, and stable for  discharge.   Clinical Impression:  1. Pain in left toe(s)   2. Freiberg's infraction, left      Discharge           Final Clinical Impression(s) / ED Diagnoses Final diagnoses:  Pain in left toe(s)  Freiberg's infraction, left    Rx / DC Orders ED Discharge Orders     None         Richardson Dopp 11/18/22 1814    Bethann Berkshire, MD 11/19/22 1249

## 2022-11-18 NOTE — ED Triage Notes (Signed)
Pt has left foot pain between his great and second toes.  States she has trouble walking d/t pain. Is not a diabetic. Reports it is red. No drainage.

## 2022-11-18 NOTE — Telephone Encounter (Signed)
Spoke with patient. Has [place] scheduled for [facility].

## 2022-11-19 ENCOUNTER — Other Ambulatory Visit: Payer: Self-pay

## 2022-11-19 ENCOUNTER — Telehealth: Payer: Self-pay

## 2022-11-19 DIAGNOSIS — G629 Polyneuropathy, unspecified: Secondary | ICD-10-CM

## 2022-11-19 DIAGNOSIS — G894 Chronic pain syndrome: Secondary | ICD-10-CM

## 2022-11-19 MED ORDER — GABAPENTIN 400 MG PO CAPS
400.0000 mg | ORAL_CAPSULE | Freq: Three times a day (TID) | ORAL | 0 refills | Status: DC
Start: 2022-11-19 — End: 2022-12-15

## 2022-11-20 NOTE — Transitions of Care (Post Inpatient/ED Visit) (Signed)
   11/20/2022  Name: Eric Woods MRN: [id number] DOB: 07-11-1975  Today's TOC FU Call Status: Today's TOC FU Call Status:: Successful TOC FU Call Competed  Transition Care Management Follow-up Telephone Call Date of Discharge: 11/18/22 [facility] (toe pain) Type of Discharge: Emergency Department Reason for ED Visit: Other: How have you been since you were released from the hospital?: Better Any questions or concerns?: No  Items Reviewed: Did you receive and understand the discharge instructions provided?: Yes Medications obtained,verified, and reconciled?: Yes (Medications Reviewed) Any new allergies since your discharge?: No Dietary orders reviewed?: NA Do you have support at home?: Yes People in Home: spouse  Medications Reviewed Today: Medications Reviewed Today     Reviewed by Annabell Sabal, CMA (Certified Medical Assistant) on 11/20/22 at 1608  Med List Status: <None>   Medication Order Taking? Sig Documenting Provider Last Dose Status Informant  Buprenorphine HCl-Naloxone HCl (SUBOXONE) 8-2 MG FILM [id number] Yes Place under the tongue. [provider] Taking Active   Cholecalciferol (VITAMIN D3) 25 MCG (1000 UT) CAPS [id number] Yes Take 1 capsule (1,000 Units total) by mouth daily. Gardenia Phlegm, MD Taking Active   gabapentin (NEURONTIN) 400 MG capsule [id number] Yes Take 1 capsule (400 mg total) by mouth 3 (three) times daily. Gilmore Laroche, FNP Taking Active   ibuprofen (ADVIL) 800 MG tablet [id number] Yes Take 1 tablet (800 mg total) by mouth 3 (three) times daily. Burgess Amor, PA-C Taking Active   meloxicam (MOBIC) 7.5 MG tablet [id number] Yes Take 1 tablet (7.5 mg total) by mouth daily. Mayers, Cari S, PA-C Taking Active   mupirocin cream (BACTROBAN) 2 % [id number] Yes Apply 1 Application topically 2 (two) times daily. Burgess Amor, PA-C Taking Active   omeprazole (PRILOSEC) 20 MG capsule [id number] Yes Take 1 capsule (20 mg total) by  mouth daily. Mayers, Kasandra Knudsen, PA-C Taking Active             Home Care and Equipment/Supplies: Were Home Health Services Ordered?: No Any new equipment or medical supplies ordered?: No  Functional Questionnaire: Do you need assistance with bathing/showering or dressing?: No Do you need assistance with meal preparation?: No Do you need assistance with eating?: No Do you have difficulty maintaining continence: No Do you need assistance with getting out of bed/getting out of a chair/moving?: No Do you have difficulty managing or taking your medications?: No  Follow up appointments reviewed: PCP Follow-up appointment confirmed?: NA Specialist Hospital Follow-up appointment confirmed?: Yes Date of Specialist follow-up appointment?: 11/22/22 Follow-Up Specialty Provider:: unknown Do you need transportation to your follow-up appointment?: No Do you understand care options if your condition(s) worsen?: Yes-patient verbalized understanding    SIGNATURE Fredirick Maudlin

## 2022-11-21 ENCOUNTER — Ambulatory Visit (HOSPITAL_COMMUNITY)
Admission: RE | Admit: 2022-11-21 | Discharge: 2022-11-21 | Disposition: A | Discharge status: Home / Self Care | Payer: Medicaid Other | Source: Ambulatory Visit | Attending: Family Medicine | Admitting: Family Medicine

## 2022-11-21 DIAGNOSIS — K429 Umbilical hernia without obstruction or gangrene: Secondary | ICD-10-CM | POA: Diagnosis present

## 2022-11-22 ENCOUNTER — Encounter: Payer: Self-pay | Admitting: Orthopedic Surgery

## 2022-11-22 ENCOUNTER — Ambulatory Visit: Payer: Medicaid Other | Admitting: Orthopedic Surgery

## 2022-11-22 VITALS — BP 145/78 | HR 57 | Ht 72.0 in | Wt 197.0 lb

## 2022-11-22 DIAGNOSIS — L84 Corns and callosities: Secondary | ICD-10-CM | POA: Diagnosis not present

## 2022-11-22 NOTE — Progress Notes (Signed)
New Patient Visit  Assessment: Eric Woods is a 47 y.o. male with the following: 1. Corn of toe  Plan: [name] notices some pain, redness and irritation along the medial aspect of the left second toe.  Appearance is consistent with a corn. Since he was seen in the ED, his pain has improved.  He has been using a Band-Aid to provide some additional protection.  We discussed what is causing the pain, and he should pay attention to the shoes that he is wearing.  He should keep this area on his toe clean and soft.  He can try a corn remover.  Toe spacers can also be effective.  If it starts to get hard, the area can be shaved or softened with a pumice stone.  He states understanding.  All questions have been answered.  He will return to clinic if he has any issues.  Follow-up: Return if symptoms worsen or fail to improve.  Subjective:  Chief Complaint  Patient presents with   Foot Problem    L foot 2nd toe black spot that he noticed after having difficulty walking. Pt thought he was bit by a spider     History of Present Illness: Eric Woods is a 47 y.o. male who presents for evaluation of left toe pain.  He has had progressively worsening pain on the medial aspect of the left toe.  He presented to the emergency department a couple of days ago.  No specific injury.  However, the onset of pain made him wonder if he was bitten by a spider.  No fevers or chills.  No redness.  At 1 point, they were concerned about gout.  He states he had some overlap of his toes on his right foot, which resulted in macerated skin and skin breakdown.  This was treated by a podiatrist.  He does not like the same thing happened.  He also notes that he has a crossover toe of the small toe.  No particular injury.   Review of Systems: No fevers or chills No numbness or tingling No chest pain No shortness of breath No bowel or bladder dysfunction No GI distress No headaches   Medical  History:  Past Medical History:  Diagnosis Date   Infection of skin due to methicillin resistant Staphylococcus aureus (MRSA)    Polysubstance abuse (HCC)    Tobacco dependence     Past Surgical History:  Procedure Laterality Date   ABDOMINAL SURGERY     HAND SURGERY     I & D EXTREMITY Right 12/19/2021   Procedure: IRRIGATION AND DEBRIDEMENT EXTREMITY;  Surgeon: Gomez Cleverly, MD;  Location: [facility] OR;  Service: Orthopedics;  Laterality: Right;   IRRIGATION AND DEBRIDEMENT KNEE Left 02/17/2022   Procedure: IRRIGATION AND DEBRIDEMENT KNEE;  Surgeon: Bjorn Pippin, MD;  Location: [facility] OR;  Service: Orthopedics;  Laterality: Left;   TEE WITHOUT CARDIOVERSION N/A 02/18/2022   Procedure: TRANSESOPHAGEAL ECHOCARDIOGRAM (TEE);  Surgeon: Chrystie Nose, MD;  Location: [facility] ENDOSCOPY;  Service: Cardiovascular;  Laterality: N/A;    History reviewed. No pertinent family history. Social History   Tobacco Use   Smoking status: Some Days    Types: E-cigarettes   Tobacco comments:    2 packs a week  Vaping Use   Vaping Use: Every day  Substance Use Topics   Alcohol use: Yes    Comment: not his drug of choice   Drug use: Not Currently    Types: Marijuana    No  Known Allergies  No outpatient medications have been marked as taking for the 11/22/22 encounter (Office Visit) with Oliver Barre, MD.    Objective: BP (!) 145/78   Pulse (!) 57   Ht 6' (1.829 m)   Wt 197 lb (89.4 kg)   BMI 26.72 kg/m   Physical Exam:  General: Alert and oriented. and No acute distress. Gait: Normal gait.  Evaluation left foot demonstrates no obvious deformity.  He has a crossover toe of the small toe over the fourth toe.  Medial aspect of the second toe, there is a small area of firm skin, with black center.  This is tender to palpation, but this is also improving.  No drainage.  No redness in that area.  There is obvious compression when he is wearing tight shoes.  Toes are warm and well-perfused otherwise.   No point tenderness elsewhere.  He has no tenderness at the second MTP joint.  IMAGING: I personally reviewed images previously obtained from the ED   X-rays from the emergency department were evaluated.  Some concern for Freiberg's infraction at second metatarsal head.   New Medications:  No orders of the defined types were placed in this encounter.     Oliver Barre, MD  11/22/2022 9:51 AM

## 2022-11-25 ENCOUNTER — Telehealth: Payer: Self-pay | Admitting: Family Medicine

## 2022-11-25 NOTE — Telephone Encounter (Signed)
Patient spouse called asked if Gilmore Laroche can send patient ultrasound to Dr. Romona Curls phon # [phone] fax # [phone].

## 2022-11-25 NOTE — Telephone Encounter (Signed)
[place] results printed and faxed.

## 2022-12-02 ENCOUNTER — Encounter: Payer: Self-pay | Admitting: Family Medicine

## 2022-12-02 ENCOUNTER — Ambulatory Visit: Payer: Medicaid Other | Admitting: Family Medicine

## 2022-12-02 VITALS — BP 122/81 | HR 80 | Ht 72.0 in | Wt 199.1 lb

## 2022-12-02 DIAGNOSIS — L0201 Cutaneous abscess of face: Secondary | ICD-10-CM | POA: Diagnosis not present

## 2022-12-02 DIAGNOSIS — R454 Irritability and anger: Secondary | ICD-10-CM | POA: Insufficient documentation

## 2022-12-02 MED ORDER — DOXYCYCLINE HYCLATE 100 MG PO TABS
100.0000 mg | ORAL_TABLET | Freq: Two times a day (BID) | ORAL | 0 refills | Status: AC
Start: 2022-12-02 — End: 2022-12-12

## 2022-12-02 MED ORDER — MUPIROCIN 2 % EX OINT
1.0000 | TOPICAL_OINTMENT | Freq: Two times a day (BID) | CUTANEOUS | 0 refills | Status: DC
Start: 2022-12-02 — End: 2024-03-02

## 2022-12-02 NOTE — Assessment & Plan Note (Signed)
Hx of left forehead abscess that that was drained  [facility] the [facility] on September [facility] but has not had complete resolution. No fever or chills reported today. He reports having flair ups with symptoms of warmth, tenderness redness at the affected site monthly   Encouraged to take doxy 100 BID for 10 days when having flair ups and apply topical mupirocin cream.

## 2022-12-02 NOTE — Progress Notes (Signed)
Established Patient Office Visit  Subjective:  Patient ID: Eric Woods, male    DOB: 1975/11/08  Age: 47 y.o. MRN: [id number]  CC: No chief complaint on file.   HPI Eric Woods is a 47 y.o. male with past medical history of MRSA bacteremia, polysubstance abuse, and neuropathy presents with c/o of irritability and anger. For the details of today's visit, please refer to the assessment and plan.       Past Medical History:  Diagnosis Date   Infection of skin due to methicillin resistant Staphylococcus aureus (MRSA)    Polysubstance abuse (HCC)    Tobacco dependence     Past Surgical History:  Procedure Laterality Date   ABDOMINAL SURGERY     HAND SURGERY     I & D EXTREMITY Right 12/19/2021   Procedure: IRRIGATION AND DEBRIDEMENT EXTREMITY;  Surgeon: Gomez Cleverly, MD;  Location: [facility] OR;  Service: Orthopedics;  Laterality: Right;   IRRIGATION AND DEBRIDEMENT KNEE Left 02/17/2022   Procedure: IRRIGATION AND DEBRIDEMENT KNEE;  Surgeon: Bjorn Pippin, MD;  Location: [facility] OR;  Service: Orthopedics;  Laterality: Left;   TEE WITHOUT CARDIOVERSION N/A 02/18/2022   Procedure: TRANSESOPHAGEAL ECHOCARDIOGRAM (TEE);  Surgeon: Chrystie Nose, MD;  Location: [facility] ENDOSCOPY;  Service: Cardiovascular;  Laterality: N/A;    History reviewed. No pertinent family history.  Social History   Socioeconomic History   Marital status: Single    Spouse name: Not on file   Number of children: Not on file   Years of education: Not on file   Highest education level: Not on file  Occupational History   Occupation: unemployed  Tobacco Use   Smoking status: Some Days    Types: E-cigarettes   Smokeless tobacco: Not on file   Tobacco comments:    2 packs a week  Vaping Use   Vaping Use: Every day  Substance and Sexual Activity   Alcohol use: Yes    Comment: not his drug of choice   Drug use: Not Currently    Types: Marijuana   Sexual activity: Yes  Other Topics Concern   Not on file   Social History Narrative   Not on file   Social Determinants of Health   Financial Resource Strain: Not on file  Food Insecurity: No Food Insecurity (02/15/2022)   Hunger Vital Sign    Worried About Running Out of Food in the Last Year: Never true    Ran Out of Food in the Last Year: Never true  Transportation Needs: No Transportation Needs (02/15/2022)   PRAPARE - Administrator, Civil Service (Medical): No    Lack of Transportation (Non-Medical): No  Physical Activity: Not on file  Stress: Not on file  Social Connections: Not on file  Intimate Partner Violence: Not At Risk (02/15/2022)   Humiliation, Afraid, Rape, and Kick questionnaire    Fear of Current or Ex-Partner: No    Emotionally Abused: No    Physically Abused: No    Sexually Abused: No    Outpatient Medications Prior to Visit  Medication Sig Dispense Refill   Buprenorphine HCl-Naloxone HCl (SUBOXONE) 8-2 MG FILM Place under the tongue.     Cholecalciferol (VITAMIN D3) 25 MCG (1000 UT) CAPS Take 1 capsule (1,000 Units total) by mouth daily. 60 capsule 1   gabapentin (NEURONTIN) 400 MG capsule Take 1 capsule (400 mg total) by mouth 3 (three) times daily. 90 capsule 0   ibuprofen (ADVIL) 800 MG tablet Take 1 tablet (800  mg total) by mouth 3 (three) times daily. 21 tablet 0   meloxicam (MOBIC) 7.5 MG tablet Take 1 tablet (7.5 mg total) by mouth daily. 30 tablet 0   omeprazole (PRILOSEC) 20 MG capsule Take 1 capsule (20 mg total) by mouth daily. 30 capsule 3   mupirocin cream (BACTROBAN) 2 % Apply 1 Application topically 2 (two) times daily. 15 g 0   No facility-administered medications prior to visit.    No Known Allergies  ROS Review of Systems  Constitutional:  Negative for fatigue and fever.  Eyes:  Negative for visual disturbance.  Respiratory:  Negative for chest tightness and shortness of breath.   Cardiovascular:  Negative for chest pain and palpitations.  Neurological:  Negative for dizziness and  headaches.      Objective:    Physical Exam HENT:     Head: Normocephalic.     Right Ear: External ear normal.     Left Ear: External ear normal.     Nose: No congestion or rhinorrhea.     Mouth/Throat:     Mouth: Mucous membranes are moist.  Cardiovascular:     Rate and Rhythm: Regular rhythm.     Heart sounds: No murmur heard. Pulmonary:     Effort: No respiratory distress.     Breath sounds: Normal breath sounds.  Skin:    Findings: Lesion (non tender) present.  Neurological:     Mental Status: He is alert.     BP 122/81   Pulse 80   Ht 6' (1.829 m)   Wt 199 lb 1.9 oz (90.3 kg)   SpO2 92%   BMI 27.01 kg/m  Wt Readings from Last 3 Encounters:  12/02/22 199 lb 1.9 oz (90.3 kg)  11/22/22 197 lb (89.4 kg)  10/23/22 202 lb 1.9 oz (91.7 kg)    Lab Results  Component Value Date   TSH 1.443 12/24/2021   Lab Results  Component Value Date   WBC 6.2 06/01/2022   HGB 13.6 06/01/2022   HCT 39.8 06/01/2022   MCV 94.8 06/01/2022   PLT 323 06/01/2022   Lab Results  Component Value Date   NA 138 06/01/2022   K 3.8 06/01/2022   CO2 27 06/01/2022   GLUCOSE 120 (H) 06/01/2022   BUN 11 06/01/2022   CREATININE 0.94 06/01/2022   BILITOT 0.6 06/01/2022   ALKPHOS 51 06/01/2022   AST 33 06/01/2022   ALT 22 06/01/2022   PROT 6.9 06/01/2022   ALBUMIN 4.0 06/01/2022   CALCIUM 9.1 06/01/2022   ANIONGAP 6 06/01/2022   No results found for: "CHOL" No results found for: "HDL" No results found for: "LDLCALC" No results found for: "TRIG" No results found for: "CHOLHDL" No results found for: "HGBA1C"    Assessment & Plan:  Irritability and anger Assessment & Plan: No reported childhood history of conduct disorder and  oppositional defiant disorder He is in the clinic today with his mother who reports childhood history of anger outbursts and depression The patient was hospitalized when he was in eighth grade for a year for treatment for anger management and  depression His mother reports that the patient is easily angered and irritable  Of note, the patient has a history of polysubstance abuse and is currently on Subutex Discussed healthy ways to manage anger including deep breathing exercises, taking a walk, journaling and exercising Referral placed to psychiatry for talk therapy and anger management   Orders: -     Ambulatory referral to Psychiatry  Abscess of forehead Assessment & Plan: Hx of left forehead abscess that that was drained  [facility] in the hosp[facility] on September 2023 but has not had complete resolution. No fever or chills reported today. He reports having flair ups with symptoms of warmth, tenderness redness at the affected site monthly   Encouraged to take doxy 100 BID for 10 days when having flair ups and apply topical mupirocin cream.    Orders: -     Doxycycline Hyclate; Take 1 tablet (100 mg total) by mouth 2 (two) times daily for 10 days.  Dispense: 20 tablet; Refill: 0 -     Mupirocin; Apply 1 Application topically 2 (two) times daily.  Dispense: 22 g; Refill: 0   Note: This chart has been completed using Engineer, civil (consulting) software, and while attempts have been made to ensure accuracy, certain words and phrases may not be transcribed as intended.    Follow-up: Return in about 7 weeks (around 01/23/2023).   Gilmore Laroche, FNP

## 2022-12-02 NOTE — Assessment & Plan Note (Signed)
No reported childhood history of conduct disorder and  oppositional defiant disorder He is in the clinic today with his mother who reports childhood history of anger outbursts and depression The patient was hospitalized when he was in eighth grade for a year for treatment for anger management and depression His mother reports that the patient is easily angered and irritable  Of note, the patient has a history of polysubstance abuse and is currently on Subutex Discussed healthy ways to manage anger including deep breathing exercises, taking a walk, journaling and exercising Referral placed to psychiatry for talk therapy and anger management

## 2022-12-02 NOTE — Patient Instructions (Addendum)
I appreciate the opportunity to provide care to you today!    Follow up:  01/23/2023  Please pick up your prescriptions at the pharmacy  Referrals today- psychiatry   Please continue to a heart-healthy diet and increase your physical activities. Try to exercise for at least five days a week.      It was a pleasure to see you and I look forward to continuing to work together on your health and well-being. Please do not hesitate to call the office if you need care or have questions about your care.   Have a wonderful day and week. With Gratitude, Gilmore Laroche MSN, FNP-BC

## 2022-12-03 ENCOUNTER — Telehealth: Payer: Self-pay | Admitting: Family Medicine

## 2022-12-03 NOTE — Telephone Encounter (Signed)
He only need mental help from [facility]

## 2022-12-03 NOTE — Telephone Encounter (Signed)
April called from [facility] in [place] they received a referral and said the patient is being treated for suboxone not sure if gettiing treated by pain clinic or suboxone clinic.   Plan on changing all his services to [facility] or mental help only? Asked for a call back [phone] ext [phone].

## 2022-12-10 ENCOUNTER — Ambulatory Visit: Payer: Medicaid Other | Admitting: Family Medicine

## 2022-12-15 ENCOUNTER — Other Ambulatory Visit: Payer: Self-pay | Admitting: Family Medicine

## 2022-12-15 DIAGNOSIS — G629 Polyneuropathy, unspecified: Secondary | ICD-10-CM

## 2022-12-15 DIAGNOSIS — G894 Chronic pain syndrome: Secondary | ICD-10-CM

## 2022-12-16 MED ORDER — GABAPENTIN 400 MG PO CAPS
400.0000 mg | ORAL_CAPSULE | Freq: Three times a day (TID) | ORAL | 0 refills | Status: DC
Start: 2022-12-16 — End: 2023-01-17

## 2023-01-02 HISTORY — PX: HERNIA REPAIR: SHX51

## 2023-01-17 ENCOUNTER — Other Ambulatory Visit: Payer: Self-pay | Admitting: Family Medicine

## 2023-01-17 DIAGNOSIS — G629 Polyneuropathy, unspecified: Secondary | ICD-10-CM

## 2023-01-17 DIAGNOSIS — G894 Chronic pain syndrome: Secondary | ICD-10-CM

## 2023-01-23 ENCOUNTER — Ambulatory Visit: Payer: Medicaid Other | Admitting: Family Medicine

## 2023-01-28 ENCOUNTER — Encounter: Payer: Self-pay | Admitting: Family Medicine

## 2023-02-07 ENCOUNTER — Encounter: Payer: Self-pay | Admitting: Family Medicine

## 2023-02-07 ENCOUNTER — Telehealth (INDEPENDENT_AMBULATORY_CARE_PROVIDER_SITE_OTHER): Payer: Medicaid Other | Admitting: Family Medicine

## 2023-02-07 DIAGNOSIS — G629 Polyneuropathy, unspecified: Secondary | ICD-10-CM

## 2023-02-07 DIAGNOSIS — J329 Chronic sinusitis, unspecified: Secondary | ICD-10-CM

## 2023-02-07 DIAGNOSIS — R454 Irritability and anger: Secondary | ICD-10-CM

## 2023-02-07 DIAGNOSIS — G894 Chronic pain syndrome: Secondary | ICD-10-CM

## 2023-02-07 DIAGNOSIS — R0981 Nasal congestion: Secondary | ICD-10-CM

## 2023-02-07 MED ORDER — LEVOCETIRIZINE DIHYDROCHLORIDE 5 MG PO TABS
5.0000 mg | ORAL_TABLET | Freq: Every evening | ORAL | 1 refills | Status: DC
Start: 2023-02-07 — End: 2023-03-31

## 2023-02-07 MED ORDER — GABAPENTIN 400 MG PO CAPS: 400.0000 mg | ORAL_CAPSULE | Freq: Three times a day (TID) | ORAL | 0 refills | Status: DC

## 2023-02-07 NOTE — Progress Notes (Unsigned)
Virtual Visit via Video Note  I connected with Eric Woods on 02/08/23 at  2:20 PM EDT by a video enabled telemedicine application and verified that I am speaking with the correct person using two identifiers.  Patient Location: Home Provider Location: Office/Clinic  I discussed the limitations, risks, security, and privacy concerns of performing an evaluation and management service by video and the availability of in person appointments. I also discussed with the patient that there may be a patient responsible charge related to this service. The patient expressed understanding and agreed to proceed.  Subjective: PCP: Gilmore Laroche, FNP  Chief Complaint  Patient presents with   Care Management    Check up    HPI Nasal congestion: The patient reports ongoing nasal congestion for the past 3-4 years, with a history of snorting drugs. He complains of no drainage from the left nostril, which has worsened over the last year. He has tried over-the-counter regimens with minimal relief and would like a referral to ENT for further evaluation.  ROS: Per HPI  Current Outpatient Medications:    Buprenorphine HCl-Naloxone HCl (SUBOXONE) 8-2 MG FILM, Place under the tongue., Disp: , Rfl:    Cholecalciferol (VITAMIN D3) 25 MCG (1000 UT) CAPS, Take 1 capsule (1,000 Units total) by mouth daily., Disp: 60 capsule, Rfl: 1   ibuprofen (ADVIL) 800 MG tablet, Take 1 tablet (800 mg total) by mouth 3 (three) times daily., Disp: 21 tablet, Rfl: 0   levocetirizine (XYZAL) 5 MG tablet, Take 1 tablet (5 mg total) by mouth every evening., Disp: 60 tablet, Rfl: 1   meloxicam (MOBIC) 7.5 MG tablet, Take 1 tablet (7.5 mg total) by mouth daily., Disp: 30 tablet, Rfl: 0   mupirocin ointment (BACTROBAN) 2 %, Apply 1 Application topically 2 (two) times daily., Disp: 22 g, Rfl: 0   omeprazole (PRILOSEC) 20 MG capsule, Take 1 capsule (20 mg total) by mouth daily., Disp: 30 capsule, Rfl: 3   gabapentin (NEURONTIN)  400 MG capsule, Take 1 capsule (400 mg total) by mouth 3 (three) times daily., Disp: 90 capsule, Rfl: 0  Observations/Objective: There were no vitals filed for this visit. Physical Exam  Assessment and Plan: Outbursts of anger Assessment & Plan: No history of conduct disorder or oppositional defiant disorder was reported during childhood. Today, the patient presents  a history of anger outbursts and depression during his childhood. The patient was hospitalized in eighth grade for a year to address these issues.The patient continues to be easily angered and irritable. Additionally, he has a history of polysubstance abuse and is currently on Subutex. Discussed with the patient healthy strategies for managing anger, such as deep breathing exercises, taking walks, journaling, and regular exercise. A referral has been made to psychiatry for talk therapy and anger management.  Patient and/or legal guardian verbally consented to [facility] services about presenting concerns and psychiatric consultation as appropriate.  The services will be billed as appropriate for the patient   Orders: -     Amb ref to Integrated Behavioral Health  Chronic congestion of paranasal sinus Assessment & Plan: The patient reports ongoing nasal congestion for the past 3-4 years, with a history of snorting drugs. He complains of no drainage from the left nostril, which has worsened over the last year. He has tried over-the-counter regimens with minimal relief and would like a referral to ENT for further evaluation.   The patient will be treated today with Xyzal, and a referral to ENT will be  placed.  Orders: -     Ambulatory referral to ENT -     Levocetirizine Dihydrochloride; Take 1 tablet (5 mg total) by mouth every evening.  Dispense: 60 tablet; Refill: 1  Neuropathy -     Gabapentin; Take 1 capsule (400 mg total) by mouth 3 (three) times daily.  Dispense: 90 capsule; Refill: 0  Chronic  pain syndrome -     Gabapentin; Take 1 capsule (400 mg total) by mouth 3 (three) times daily.  Dispense: 90 capsule; Refill: 0    Follow Up Instructions: No follow-ups on file.   I discussed the assessment and treatment plan with the patient. The patient was provided an opportunity to ask questions, and all were answered. The patient agreed with the plan and demonstrated an understanding of the instructions.   The patient was advised to call back or seek an in-person evaluation if the symptoms worsen or if the condition fails to improve as anticipated.  The above assessment and management plan was discussed with the patient. The patient verbalized understanding of and has agreed to the management plan.   Gilmore Laroche, FNP

## 2023-02-07 NOTE — Assessment & Plan Note (Signed)
No history of conduct disorder or oppositional defiant disorder was reported during childhood. Today, the patient presents  a history of anger outbursts and depression during his childhood. The patient was hospitalized in eighth grade for a year to address these issues.The patient continues to be easily angered and irritable. Additionally, he has a history of polysubstance abuse and is currently on Subutex. Discussed with the patient healthy strategies for managing anger, such as deep breathing exercises, taking walks, journaling, and regular exercise. A referral has been made to psychiatry for talk therapy and anger management.  Patient and/or legal guardian verbally consented to [facility] services about presenting concerns and psychiatric consultation as appropriate.  The services will be billed as appropriate for the patient

## 2023-02-08 NOTE — Assessment & Plan Note (Signed)
The patient reports ongoing nasal congestion for the past 3-4 years, with a history of snorting drugs. He complains of no drainage from the left nostril, which has worsened over the last year. He has tried over-the-counter regimens with minimal relief and would like a referral to ENT for further evaluation.   The patient will be treated today with Xyzal, and a referral to ENT will be placed.

## 2023-02-11 ENCOUNTER — Encounter (HOSPITAL_COMMUNITY): Payer: Self-pay | Admitting: Internal Medicine

## 2023-02-18 ENCOUNTER — Other Ambulatory Visit: Payer: Self-pay | Admitting: Family Medicine

## 2023-02-18 DIAGNOSIS — G629 Polyneuropathy, unspecified: Secondary | ICD-10-CM

## 2023-02-18 DIAGNOSIS — G894 Chronic pain syndrome: Secondary | ICD-10-CM

## 2023-03-09 ENCOUNTER — Other Ambulatory Visit: Payer: Self-pay | Admitting: Family Medicine

## 2023-03-09 DIAGNOSIS — G629 Polyneuropathy, unspecified: Secondary | ICD-10-CM

## 2023-03-09 DIAGNOSIS — G894 Chronic pain syndrome: Secondary | ICD-10-CM

## 2023-03-10 MED ORDER — GABAPENTIN 400 MG PO CAPS
400.0000 mg | ORAL_CAPSULE | Freq: Three times a day (TID) | ORAL | 0 refills | Status: DC
Start: 2023-03-10 — End: 2023-04-15

## 2023-03-17 ENCOUNTER — Other Ambulatory Visit: Payer: Self-pay | Admitting: Family Medicine

## 2023-03-17 DIAGNOSIS — G894 Chronic pain syndrome: Secondary | ICD-10-CM

## 2023-03-17 DIAGNOSIS — G629 Polyneuropathy, unspecified: Secondary | ICD-10-CM

## 2023-03-28 ENCOUNTER — Institutional Professional Consult (permissible substitution): Payer: Medicaid Other | Admitting: Professional Counselor

## 2023-03-31 ENCOUNTER — Ambulatory Visit (INDEPENDENT_AMBULATORY_CARE_PROVIDER_SITE_OTHER): Payer: Medicaid Other | Admitting: Otolaryngology

## 2023-03-31 ENCOUNTER — Encounter (INDEPENDENT_AMBULATORY_CARE_PROVIDER_SITE_OTHER): Payer: Self-pay | Admitting: Otolaryngology

## 2023-03-31 VITALS — BP 132/73 | HR 62 | Ht 72.0 in | Wt 189.0 lb

## 2023-03-31 DIAGNOSIS — R490 Dysphonia: Secondary | ICD-10-CM

## 2023-03-31 DIAGNOSIS — J3489 Other specified disorders of nose and nasal sinuses: Secondary | ICD-10-CM

## 2023-03-31 DIAGNOSIS — J343 Hypertrophy of nasal turbinates: Secondary | ICD-10-CM

## 2023-03-31 DIAGNOSIS — R0982 Postnasal drip: Secondary | ICD-10-CM

## 2023-03-31 DIAGNOSIS — K219 Gastro-esophageal reflux disease without esophagitis: Secondary | ICD-10-CM | POA: Diagnosis not present

## 2023-03-31 DIAGNOSIS — F17210 Nicotine dependence, cigarettes, uncomplicated: Secondary | ICD-10-CM

## 2023-03-31 DIAGNOSIS — R0981 Nasal congestion: Secondary | ICD-10-CM

## 2023-03-31 DIAGNOSIS — J329 Chronic sinusitis, unspecified: Secondary | ICD-10-CM

## 2023-03-31 DIAGNOSIS — J342 Deviated nasal septum: Secondary | ICD-10-CM

## 2023-03-31 DIAGNOSIS — J3089 Other allergic rhinitis: Secondary | ICD-10-CM

## 2023-03-31 MED ORDER — CETIRIZINE HCL 10 MG PO TABS: 10.0000 mg | ORAL_TABLET | Freq: Every day | ORAL | 11 refills | Status: AC

## 2023-03-31 MED ORDER — FLUTICASONE PROPIONATE 50 MCG/ACT NA SUSP: 2.0000 | Freq: Two times a day (BID) | NASAL | 6 refills | Status: AC

## 2023-03-31 NOTE — Patient Instructions (Addendum)
-   schedule CT sinuses - start Flonase Zyrtec

## 2023-03-31 NOTE — Progress Notes (Signed)
ENT CONSULT:  Reason for Consult: Chronic nasal congestion nasal obstruction history of facial trauma  HPI: Eric Woods is an 46 y.o. 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  History of facial trauma while incarcerated. Not on nasal sprays or allergy medications, no prior allergy testing.  No prior head neck surgery. His sense of smell is intact. His voice is always raspy.  Denies hemoptysis.  Denies dyspnea or dysphagia.   Records Reviewed:  Video visit with PCP 02/07/23 HPI Nasal congestion: The patient reports ongoing nasal congestion for the past 3-4 years, with a history of snorting drugs. He complains of no drainage from the left nostril, which has worsened over the last year. He has tried over-the-counter regimens with minimal relief and would like a referral to ENT for further evaluation. Outbursts of anger Assessment & Plan: No history of conduct disorder or oppositional defiant disorder was reported during childhood. Today, the patient presents  a history of anger outbursts and depression during his childhood. The patient was hospitalized in eighth grade for a year to address these issues.The patient continues to be easily angered and irritable. Additionally, he has a history of polysubstance abuse and is currently on Subutex. Discussed with the patient healthy strategies for managing anger, such as deep breathing exercises, taking walks, journaling, and regular exercise. A referral has been made to psychiatry for talk therapy and anger management.   Patient and/or legal guardian verbally consented to [facility] services about presenting concerns and psychiatric consultation as appropriate.  The services will be billed as appropriate for the patient    Orders: -     Amb ref to Integrated Behavioral  Health Outbursts of anger Assessment & Plan: No history of conduct disorder or oppositional defiant disorder was reported during childhood. Today, the patient presents  a history of anger outbursts and depression during his childhood. The patient was hospitalized in eighth grade for a year to address these issues.The patient continues to be easily angered and irritable. Additionally, he has a history of polysubstance abuse and is currently on Subutex. Discussed with the patient healthy strategies for managing anger, such as deep breathing exercises, taking walks, journaling, and regular exercise. A referral has been made to psychiatry for talk therapy and anger management.   Patient and/or legal guardian verbally consented to [facility] services about presenting concerns and psychiatric consultation as appropriate.  The services will be billed as appropriate for the patient    Orders: -     Amb ref to Integrated Behavioral Health    Past Medical History:  Diagnosis Date   Infection of skin due to methicillin resistant Staphylococcus aureus (MRSA)    Polysubstance abuse (HCC)    Tobacco dependence     Past Surgical History:  Procedure Laterality Date   ABDOMINAL SURGERY     HAND SURGERY     I & D EXTREMITY Right 12/19/2021   Procedure: IRRIGATION AND DEBRIDEMENT EXTREMITY;  Surgeon: Gomez Cleverly, MD;  Location: [facility] OR;  Service: Orthopedics;  Laterality: Right;   IRRIGATION AND DEBRIDEMENT KNEE Left 02/17/2022   Procedure: IRRIGATION AND DEBRIDEMENT KNEE;  Surgeon: Bjorn Pippin, MD;  Location: [facility] OR;  Service: Orthopedics;  Laterality: Left;   TEE WITHOUT CARDIOVERSION N/A 02/18/2022   Procedure: TRANSESOPHAGEAL ECHOCARDIOGRAM (TEE);  Surgeon: Chrystie Nose, MD;  Location: [facility] ENDOSCOPY;  Service: Cardiovascular;  Laterality: N/A;    History reviewed. No pertinent family history.  Social History:  reports that he has been smoking e-cigarettes. He does not  have any smokeless tobacco history on file. He reports current alcohol use. He reports that he does not currently use drugs after having used the following drugs: Marijuana.  Allergies: No Known Allergies  Medications: I have reviewed the patient's current medications.  The PMH, PSH, Medications, Allergies, and SH were reviewed and updated.  ROS: Constitutional: Negative for fever, weight loss and weight gain. Cardiovascular: Negative for chest pain and dyspnea on exertion. Respiratory: Is not experiencing shortness of breath at rest. Gastrointestinal: Negative for nausea and vomiting. Neurological: Negative for headaches. Psychiatric: The patient is not nervous/anxious  Blood pressure 132/73, pulse 62, height 6' (1.829 m), weight 189 lb (85.7 kg), SpO2 97%.  PHYSICAL EXAM:  Exam: General: Well-developed, well-nourished Communication and Voice: Raspy Respiratory Respiratory effort: Equal inspiration and expiration without stridor Cardiovascular Peripheral Vascular: Warm extremities with equal color/perfusion Eyes: No nystagmus with equal extraocular motion bilaterally Neuro/Psych/Balance: Patient oriented to person, place, and time; Appropriate mood and affect; Gait is intact with no imbalance; Cranial nerves I-XII are intact Head and Face Inspection: Normocephalic and atraumatic without mass or lesion, well-healed scars along the right periorbital and temple area likely related to Palpation: Facial skeleton intact without bony stepoffs Salivary Glands: No mass or tenderness Facial Strength: Facial motility symmetric and full bilaterally ENT Pinna: External ear intact and fully developed External canal: Canal is patent with intact skin Tympanic Membrane: Clear and mobile External Nose: No scar or anatomic deformity Internal Nose: Septum is deviated with S shaped septum. No polyp, or purulence. Mucosal edema and erythema present.  Bilateral inferior turbinate hypertrophy.   Lips, Teeth, and gums: Mucosa and teeth intact and viable TMJ: No pain to palpation with full mobility Oral cavity/oropharynx: No erythema or exudate, no lesions present Nasopharynx: No mass or lesion with intact mucosa Hypopharynx: Intact mucosa without pooling of secretions Larynx Glottic: Full true vocal cord mobility without lesion or mass, mild VF edema Supraglottic: Normal appearing epiglottis and AE folds Interarytenoid Space: No or minimal pachydermia or edema Subglottic Space: Patent without lesion or edema Neck Neck and Trachea: Midline trachea without mass or lesion Thyroid: No mass or nodularity Lymphatics: No lymphadenopathy  Procedure:   PROCEDURE NOTE: nasal endoscopy  Preoperative diagnosis: chronic sinusitis symptoms  Postoperative diagnosis: same  Procedure: Diagnostic nasal endoscopy (16109)  Surgeon: Ashok Croon, M.D.  Anesthesia: Topical lidocaine and Afrin  H&P REVIEW: The patient's history and physical were reviewed today prior to procedure. All medications were reviewed and updated as well. Complications: None Condition is stable throughout exam Indications and consent: The patient presents with symptoms of chronic sinusitis not responding to previous therapies. All the risks, benefits, and potential complications were reviewed with the patient preoperatively and informed consent was obtained. The time out was completed with confirmation of the correct procedure.   Procedure: The patient was seated upright in the clinic. Topical lidocaine and Afrin were applied to the nasal cavity. After adequate anesthesia had occurred, the rigid nasal endoscope was passed into the nasal cavity. The nasal mucosa, turbinates, septum, and sinus drainage pathways were visualized bilaterally. This revealed no purulence or significant secretions that might be cultured. There were no polyps or sites of significant inflammation. The mucosa was intact and there was no  crusting present. The scope was then slowly withdrawn and the patient tolerated the procedure well. There were no complications  or blood loss.  Preoperative diagnosis: dysphonia hx of smoking   Postoperative diagnosis:   Same + GERD/LPR  Procedure: Flexible fiberoptic laryngoscopy  Surgeon: Ashok Croon, MD  Anesthesia: Topical lidocaine and Afrin Complications: None Condition is stable throughout exam  Indications and consent:  The patient presents to the clinic with Indirect laryngoscopy view was incomplete. Thus it was recommended that they undergo a flexible fiberoptic laryngoscopy. All of the risks, benefits, and potential complications were reviewed with the patient preoperatively and verbal informed consent was obtained.  Procedure: The patient was seated upright in the clinic. Topical lidocaine and Afrin were applied to the nasal cavity. After adequate anesthesia had occurred, I then proceeded to pass the flexible telescope into the nasal cavity. The nasal cavity was patent without rhinorrhea or polyp. The nasopharynx was also patent without mass or lesion. The base of tongue was visualized and was normal. There were no signs of pooling of secretions in the piriform sinuses. The true vocal folds were mobile bilaterally. There were no signs of glottic or supraglottic mucosal lesion or mass. There was moderate interarytenoid pachydermia and post cricoid edema. The telescope was then slowly withdrawn and the patient tolerated the procedure throughout.   Studies Reviewed: CT orbit 2023 Orbits: Mild left orbital preseptal soft tissue thickening. No evidence of postseptal inflammation or abscess. Intact globes. No orbital mass.   Visible paranasal sinuses: Clear.   Soft tissues: Moderate soft tissue thickening involving the left greater than right forehead, incompletely imaged. No evidence of a forehead abscess where imaged.   Osseous: No fracture or destructive osseous  process.   Limited intracranial: Unremarkable.   IMPRESSION: Left periorbital and partially visualized forehead soft tissue thickening compatible with cellulitis. No evidence of postseptal cellulitis or abscess.     Assessment/Plan: Encounter Diagnoses  Name Primary?   Chronic sinusitis, unspecified location Yes   Dysphonia    Chronic GERD    Chronic nasal congestion    Nasal obstruction    Nasal congestion    Hypertrophy of both inferior nasal turbinates    Nasal septal deviation    Environmental and seasonal allergies    Post-nasal drip    Hoarseness    Tobacco dependence due to cigarettes    Chronic nasal congestion/nasal obstruction left greater than right, present for years in the setting of facial trauma in the past.  Doing nasal rinses with Navage.  Not on allergy medication or nasal sprays.  No prior allergy testing.  Nasal endoscopy today with S shaped septum and significant narrowing of the nasal passage worse on the right, no polyps or purulence.  - schedule CT sinuses to rule out chronic sinus inflammation -He had CT orbit done in 2023 which shows narrowing of the right nasal passage due to septal deviation and inferior turban hypertrophy, otherwise clear paranasal sinuses at the time of the scan - start Flonase 2 puffs twice daily bilateral nares and Zyrtec 10 mg daily -Return after imaging -Will consider allergy testing in the future  2.  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  He did have significant postcricoid edema and pachydermia on the scope exam and scant thick mucus along the base of the tongue -We discussed vocal hygiene and importance of quitting smoking -Medical management of GERD and postnasal drainage  3.  Tobacco use disorder, chronic -We discussed smoking cessation spent 5 minutes discussing importance of smoking cessation  to control  his symptoms of dysphonia and nasal congestion  4. Septal deviation and inferior turbinate hypertrophy/suspected environmental allergies  - medical management as above - will consider Septo/IRT in the future   Thank you for allowing me to participate in the care of this patient. Please do not hesitate to contact me with any questions or concerns.   Ashok Croon, MD Otolaryngology [facility] Phone: [phone] Fax: [phone]    03/31/2023, 12:11 PM

## 2023-04-15 ENCOUNTER — Other Ambulatory Visit: Payer: Self-pay | Admitting: Family Medicine

## 2023-04-15 DIAGNOSIS — G629 Polyneuropathy, unspecified: Secondary | ICD-10-CM

## 2023-04-15 DIAGNOSIS — G894 Chronic pain syndrome: Secondary | ICD-10-CM

## 2023-04-25 ENCOUNTER — Institutional Professional Consult (permissible substitution): Payer: Medicaid Other | Admitting: Professional Counselor

## 2023-05-09 ENCOUNTER — Other Ambulatory Visit: Payer: Self-pay | Admitting: Family Medicine

## 2023-05-09 ENCOUNTER — Ambulatory Visit (HOSPITAL_COMMUNITY): Admission: RE | Admit: 2023-05-09 | Payer: Medicaid Other | Source: Ambulatory Visit

## 2023-05-09 DIAGNOSIS — G894 Chronic pain syndrome: Secondary | ICD-10-CM

## 2023-05-09 DIAGNOSIS — G629 Polyneuropathy, unspecified: Secondary | ICD-10-CM

## 2023-05-12 MED ORDER — GABAPENTIN 400 MG PO CAPS
400.0000 mg | ORAL_CAPSULE | Freq: Three times a day (TID) | ORAL | 0 refills | Status: DC
Start: 2023-05-12 — End: 2023-06-13

## 2023-05-16 ENCOUNTER — Other Ambulatory Visit: Payer: Self-pay | Admitting: Family Medicine

## 2023-05-16 DIAGNOSIS — G629 Polyneuropathy, unspecified: Secondary | ICD-10-CM

## 2023-05-16 DIAGNOSIS — G894 Chronic pain syndrome: Secondary | ICD-10-CM

## 2023-05-23 ENCOUNTER — Ambulatory Visit: Payer: Medicaid Other | Admitting: Professional Counselor

## 2023-05-23 DIAGNOSIS — R454 Irritability and anger: Secondary | ICD-10-CM

## 2023-05-23 NOTE — BH Specialist Note (Signed)
Patient present to collaborative care assessment seeming intoxicated. Patient was slurring his words and seemed a bit confused. However, patient was cooperative. Patient stated that he thinks he needs adderol. [facility] explained the process of collaborative care and determined it was not the best fit for his needs. [facility] recommended he go to [facility] due to the severity of his symptoms and his presentation. He agreed. He said "I actually would rather go to [facility] because I went there before". We will sign off on this patient.

## 2023-05-23 NOTE — Patient Instructions (Addendum)
If your symptoms worsen or you have thoughts of suicide/homicide, PLEASE SEEK IMMEDIATE MEDICAL ATTENTION.  You may always call:   National Suicide Hotline: 988 or 936-270-8444 [place] Crisis Line: 680 885 2337 Crisis Recovery in [place]: (347) 053-7636     These are available 24 hours a day, 7 days a week.   Patient recommended to go to [facility].

## 2023-06-06 ENCOUNTER — Telehealth (INDEPENDENT_AMBULATORY_CARE_PROVIDER_SITE_OTHER): Payer: Medicaid Other | Admitting: Professional Counselor

## 2023-06-06 DIAGNOSIS — F4310 Post-traumatic stress disorder, unspecified: Secondary | ICD-10-CM

## 2023-06-06 NOTE — BH Specialist Note (Signed)
Virtual Behavioral Health Treatment Plan Team Note  MRN: [id number] NAME: Eric Woods  DATE: 06/06/23  Start time: Start Time: 1230 End time: Stop Time: 1240 Total time: Total Time in Minutes (Visit): 10  Total number of Virtual BH Treatment Team Plan encounters: 1/4  Treatment Team Attendees: Dr. Lolly Mustache and Esmond Harps  Diagnoses:    ICD-10-CM   1. PTSD (post-traumatic stress disorder)  F43.10       Goals, Interventions and Follow-up Plan Goals:  Manage PTSD and substance use Interventions:  Motivational Interviewing Medication Management Recommendations: Defer Follow-up Plan:  Refer to [facility]  History of the present illness Presenting Problem/Current Symptoms:  Patient present to collaborative care assessment seeming intoxicated. Patient was slurring his words and seemed a bit confused. However, patient was cooperative. Patient stated that he thinks he needs adderol. [facility] explained the process of collaborative care and determined it was not the best fit for his needs. [facility] recommended he go to [facility] due to the severity of his symptoms and his presentation. He agreed. He said "I actually would rather go to [facility] because I went there before". We will sign off on this patient.   Screenings PHQ-9 Assessments:     05/23/2023   10:37 AM 12/02/2022    1:54 PM 10/23/2022   10:14 AM  Depression screen PHQ 2/9  Decreased Interest 0 2 1  Down, Depressed, Hopeless 2 1 2   PHQ - 2 Score 2 3 3   Altered sleeping 0 1 2  Tired, decreased energy 0 1 1  Change in appetite 2 0 2  Feeling bad or failure about yourself  0 2 1  Trouble concentrating 2 2 1   Moving slowly or fidgety/restless 2 1 0  Suicidal thoughts 0 0 0  PHQ-9 Score 8 10 10   Difficult doing work/chores Somewhat difficult  Somewhat difficult   GAD-7 Assessments:     05/23/2023   10:36 AM 12/02/2022    1:54 PM 10/23/2022   10:15 AM 09/05/2022   11:54 AM  GAD 7 :  Generalized Anxiety Score  Nervous, Anxious, on Edge 0 0 1 2  Control/stop worrying 2 3 2 2   Worry too much - different things 2 3 2 2   Trouble relaxing 2 0 2 2  Restless 2 0 2 2  Easily annoyed or irritable 2 3 2 2   Afraid - awful might happen 2 0 1 2  Total GAD 7 Score 12 9 12 14   Anxiety Difficulty Somewhat difficult  Somewhat difficult Very difficult    Past Medical History Past Medical History:  Diagnosis Date   Infection of skin due to methicillin resistant Staphylococcus aureus (MRSA)    Polysubstance abuse (HCC)    Tobacco dependence     Vital signs: There were no vitals filed for this visit.  Allergies:  Allergies as of 06/06/2023   (No Known Allergies)    Medication History Current medications:  Outpatient Encounter Medications as of 06/06/2023  Medication Sig   Buprenorphine HCl-Naloxone HCl (SUBOXONE) 8-2 MG FILM Place under the tongue.   cetirizine (ZYRTEC) 10 MG tablet Take 1 tablet (10 mg total) by mouth daily.   Cholecalciferol (VITAMIN D3) 25 MCG (1000 UT) CAPS Take 1 capsule (1,000 Units total) by mouth daily.   fluticasone (FLONASE) 50 MCG/ACT nasal spray Place 2 sprays into both nostrils 2 (two) times daily.   gabapentin (NEURONTIN) 400 MG capsule Take 1 capsule (400 mg total) by mouth 3 (three) times daily.  ibuprofen (ADVIL) 800 MG tablet Take 1 tablet (800 mg total) by mouth 3 (three) times daily.   meloxicam (MOBIC) 7.5 MG tablet Take 1 tablet (7.5 mg total) by mouth daily.   mupirocin ointment (BACTROBAN) 2 % Apply 1 Application topically 2 (two) times daily. (Patient not taking: Reported on 03/31/2023)   omeprazole (PRILOSEC) 20 MG capsule Take 1 capsule (20 mg total) by mouth daily.   No facility-administered encounter medications on file as of 06/06/2023.     Scribe for Treatment Team: Reuel Boom

## 2023-06-13 ENCOUNTER — Other Ambulatory Visit: Payer: Self-pay | Admitting: Family Medicine

## 2023-06-13 DIAGNOSIS — G894 Chronic pain syndrome: Secondary | ICD-10-CM

## 2023-06-13 DIAGNOSIS — G629 Polyneuropathy, unspecified: Secondary | ICD-10-CM

## 2023-07-02 ENCOUNTER — Ambulatory Visit (INDEPENDENT_AMBULATORY_CARE_PROVIDER_SITE_OTHER): Payer: Medicaid Other | Admitting: Otolaryngology

## 2023-07-17 ENCOUNTER — Other Ambulatory Visit: Payer: Self-pay | Admitting: Family Medicine

## 2023-07-17 DIAGNOSIS — G894 Chronic pain syndrome: Secondary | ICD-10-CM

## 2023-07-17 DIAGNOSIS — G629 Polyneuropathy, unspecified: Secondary | ICD-10-CM

## 2023-07-19 ENCOUNTER — Other Ambulatory Visit: Payer: Self-pay | Admitting: Family Medicine

## 2023-07-19 DIAGNOSIS — G629 Polyneuropathy, unspecified: Secondary | ICD-10-CM

## 2023-07-19 DIAGNOSIS — G894 Chronic pain syndrome: Secondary | ICD-10-CM

## 2023-07-21 ENCOUNTER — Encounter (INDEPENDENT_AMBULATORY_CARE_PROVIDER_SITE_OTHER): Payer: Self-pay | Admitting: Otolaryngology

## 2023-08-18 ENCOUNTER — Other Ambulatory Visit: Payer: Self-pay | Admitting: Family Medicine

## 2023-08-18 DIAGNOSIS — G629 Polyneuropathy, unspecified: Secondary | ICD-10-CM

## 2023-08-18 DIAGNOSIS — G894 Chronic pain syndrome: Secondary | ICD-10-CM

## 2023-08-29 ENCOUNTER — Ambulatory Visit: Payer: Medicaid Other | Admitting: Family Medicine

## 2023-09-03 ENCOUNTER — Encounter: Payer: Self-pay | Admitting: Family Medicine

## 2023-10-29 ENCOUNTER — Other Ambulatory Visit: Payer: Self-pay | Admitting: Otolaryngology

## 2023-11-17 ENCOUNTER — Other Ambulatory Visit: Payer: Self-pay | Admitting: Family Medicine

## 2023-11-17 ENCOUNTER — Other Ambulatory Visit: Payer: Self-pay

## 2023-11-17 ENCOUNTER — Encounter: Payer: Self-pay | Admitting: Family Medicine

## 2023-11-17 DIAGNOSIS — G894 Chronic pain syndrome: Secondary | ICD-10-CM

## 2023-11-17 DIAGNOSIS — G629 Polyneuropathy, unspecified: Secondary | ICD-10-CM

## 2023-11-17 MED ORDER — GABAPENTIN 400 MG PO CAPS
400.0000 mg | ORAL_CAPSULE | Freq: Three times a day (TID) | ORAL | 0 refills | Status: DC
Start: 2023-11-17 — End: 2024-01-15

## 2023-11-17 NOTE — Telephone Encounter (Signed)
 Copied from CRM [id number]. Topic: Clinical - Medication Refill >> Nov 17, 2023 11:25 AM Emylou G wrote: Medication: gabapentin  (NEURONTIN ) 400 MG capsule  Has the patient contacted their pharmacy? Yes (Agent: If no, request that the patient contact the pharmacy for the refill. If patient does not wish to contact the pharmacy document the reason why and proceed with request.) (Agent: If yes, when and what did the pharmacy advise?) no refills  This is the patient's preferred pharmacy:  [facility] [address] [place] - [address] [facility]   [facility] [phone] - [place], [place] - [address] AT [facility] [address] [place] [address] Phone: [phone] Fax: [phone]  Is this the correct pharmacy for this prescription? Yes If no, delete pharmacy and type the correct one.   Has the prescription been filled recently? Yes  Is the patient out of the medication? Yes  Has the patient been seen for an appointment in the last year OR does the patient have an upcoming appointment? Yes  Can we respond through MyChart? Yes  Agent: Please be advised that Rx refills may take up to 3 business days. We ask that you follow-up with your pharmacy.

## 2023-11-17 NOTE — Telephone Encounter (Signed)
 Refills sent to pharmacy.

## 2023-11-18 ENCOUNTER — Encounter (HOSPITAL_BASED_OUTPATIENT_CLINIC_OR_DEPARTMENT_OTHER): Payer: Self-pay | Admitting: Otolaryngology

## 2023-11-20 ENCOUNTER — Encounter (HOSPITAL_BASED_OUTPATIENT_CLINIC_OR_DEPARTMENT_OTHER): Payer: Self-pay | Admitting: Otolaryngology

## 2023-11-20 ENCOUNTER — Other Ambulatory Visit: Payer: Self-pay

## 2023-11-20 NOTE — Progress Notes (Signed)
   11/20/23 1205  Pre-op Phone Call  Surgery Date Verified 11/25/23  Arrival Time Verified 1000  Surgery Location Verified [facility] [place]  Medical History Reviewed Yes  Is the patient taking a GLP-1 receptor agonist? No  Does the patient have diabetes? No diagnosis of diabetes  Do you have a history of heart problems? No  Have you ever had tests on your heart? Yes  What cardiac tests were performed? Echo  What date/year were cardiac tests completed? 02/2022 TEE EF 60-65%  Results viewable: [facility] Media Tab  Does patient have other implanted devices? No  Patient Teaching Enhanced Recovery;Pre / Post Procedure  Patient educated about smoking cessation 24 hours prior to surgery. (S)  Yes  Med Rec Completed Yes  Take the Following Meds the Morning of Surgery suboxone  Recent  Lab Work, EKG, CXR? No  NPO (Including gum & candy) After midnight  Allowed clear liquids Water;Gatorade  (diabetics please choose diet or no sugar options)  Patient instructed to stop clear liquids including Carb loading drink at: 0800  Stop Solids, Milk, Candy, and Gum STARTING AT MIDNIGHT  Name & Phone Number for Ride/Caregiver Ruthann Cover and Maureen Sour  No Jewelry, money, nail polish or make-up.  No lotions, powders, perfumes. No shaving  48 hrs. prior to surgery. Yes  Contacts, Dentures & Glasses Will Have to be Removed Before OR. Yes  Please bring your ID and Insurance Card the morning of your surgery. (Surgery Centers Only) Yes  Bring any papers or x-rays with you that your surgeon gave you. Yes  Instructed to contact the location of procedure/ provider if they or anyone in their household develops symptoms or tests positive for COVID-19, has close contact with someone who tests positive for COVID, or has known exposure to any contagious illness. Yes  Call this number the morning of surgery  with any problems that may cancel your surgery. [phone]

## 2023-11-24 ENCOUNTER — Other Ambulatory Visit: Payer: Self-pay | Admitting: Otolaryngology

## 2023-11-25 ENCOUNTER — Encounter (HOSPITAL_BASED_OUTPATIENT_CLINIC_OR_DEPARTMENT_OTHER)
Admission: RE | Disposition: A | Discharge status: Home / Self Care | Payer: Self-pay | Source: Home / Self Care | Attending: Otolaryngology

## 2023-11-25 ENCOUNTER — Other Ambulatory Visit: Payer: Self-pay

## 2023-11-25 ENCOUNTER — Ambulatory Visit (HOSPITAL_BASED_OUTPATIENT_CLINIC_OR_DEPARTMENT_OTHER): Admitting: Anesthesiology

## 2023-11-25 ENCOUNTER — Encounter (HOSPITAL_BASED_OUTPATIENT_CLINIC_OR_DEPARTMENT_OTHER): Payer: Self-pay | Admitting: Otolaryngology

## 2023-11-25 ENCOUNTER — Ambulatory Visit (HOSPITAL_BASED_OUTPATIENT_CLINIC_OR_DEPARTMENT_OTHER)
Admission: RE | Admit: 2023-11-25 | Discharge: 2023-11-25 | Disposition: A | Discharge status: Home / Self Care | Attending: Otolaryngology | Admitting: Otolaryngology

## 2023-11-25 DIAGNOSIS — J342 Deviated nasal septum: Secondary | ICD-10-CM | POA: Diagnosis present

## 2023-11-25 DIAGNOSIS — J343 Hypertrophy of nasal turbinates: Secondary | ICD-10-CM | POA: Diagnosis present

## 2023-11-25 DIAGNOSIS — F17290 Nicotine dependence, other tobacco product, uncomplicated: Secondary | ICD-10-CM | POA: Diagnosis not present

## 2023-11-25 DIAGNOSIS — K219 Gastro-esophageal reflux disease without esophagitis: Secondary | ICD-10-CM | POA: Diagnosis not present

## 2023-11-25 HISTORY — DX: Deviated nasal septum: J34.2

## 2023-11-25 HISTORY — DX: Gastro-esophageal reflux disease without esophagitis: K21.9

## 2023-11-25 HISTORY — PX: NASAL SEPTOPLASTY W/ TURBINOPLASTY: SHX2070

## 2023-11-25 SURGERY — SEPTOPLASTY, NOSE, WITH NASAL TURBINATE REDUCTION
Anesthesia: General | Laterality: Bilateral

## 2023-11-25 MED ORDER — ACETAMINOPHEN 500 MG PO TABS: 1000.0000 mg | ORAL_TABLET | Freq: Once | ORAL | Status: DC

## 2023-11-25 MED ORDER — ONDANSETRON HCL 4 MG/2ML IJ SOLN
INTRAMUSCULAR | Status: DC | PRN
Start: 2023-11-25 — End: 2023-11-25
  Administered 2023-11-25: 4 mg via INTRAVENOUS

## 2023-11-25 MED ORDER — PROPOFOL 10 MG/ML IV BOLUS
INTRAVENOUS | Status: DC | PRN
  Administered 2023-11-25: 200 mg via INTRAVENOUS

## 2023-11-25 MED ORDER — FENTANYL CITRATE (PF) 100 MCG/2ML IJ SOLN
INTRAMUSCULAR | Status: AC
Start: 2023-11-25 — End: 2023-11-25
  Filled 2023-11-25: qty 2

## 2023-11-25 MED ORDER — SODIUM CHLORIDE 0.9 % IV SOLN
INTRAVENOUS | Status: AC | PRN
  Administered 2023-11-25: 1000 mL

## 2023-11-25 MED ORDER — BUPIVACAINE-EPINEPHRINE (PF) 0.25% -1:200000 IJ SOLN
INTRAMUSCULAR | Status: DC | PRN
  Administered 2023-11-25: 6 mL

## 2023-11-25 MED ORDER — DEXAMETHASONE SODIUM PHOSPHATE 10 MG/ML IJ SOLN
INTRAMUSCULAR | Status: AC
Start: 2023-11-25 — End: 2023-11-25
  Filled 2023-11-25: qty 1

## 2023-11-25 MED ORDER — OXYCODONE HCL 5 MG/5ML PO SOLN: 5.0000 mg | Freq: Once | ORAL | Status: AC | PRN

## 2023-11-25 MED ORDER — LACTATED RINGERS IV SOLN: INTRAVENOUS | Status: DC

## 2023-11-25 MED ORDER — MIDAZOLAM HCL 5 MG/5ML IJ SOLN
INTRAMUSCULAR | Status: DC | PRN
  Administered 2023-11-25: 2 mg via INTRAVENOUS

## 2023-11-25 MED ORDER — GLYCOPYRROLATE PF 0.2 MG/ML IJ SOSY
PREFILLED_SYRINGE | INTRAMUSCULAR | Status: AC
  Filled 2023-11-25: qty 1

## 2023-11-25 MED ORDER — CEFAZOLIN SODIUM-DEXTROSE 2-4 GM/100ML-% IV SOLN
2.0000 g | INTRAVENOUS | Status: AC
  Administered 2023-11-25: 2 g via INTRAVENOUS

## 2023-11-25 MED ORDER — MUPIROCIN 2 % EX OINT: TOPICAL_OINTMENT | CUTANEOUS | Status: DC | PRN

## 2023-11-25 MED ORDER — PROPOFOL 500 MG/50ML IV EMUL
INTRAVENOUS | Status: AC
Start: 2023-11-25 — End: 2023-11-25
  Filled 2023-11-25: qty 50

## 2023-11-25 MED ORDER — SUGAMMADEX SODIUM 200 MG/2ML IV SOLN
INTRAVENOUS | Status: DC | PRN
  Administered 2023-11-25: 200 mg via INTRAVENOUS

## 2023-11-25 MED ORDER — ONDANSETRON HCL 4 MG/2ML IJ SOLN
INTRAMUSCULAR | Status: AC
Start: 2023-11-25 — End: 2023-11-25
  Filled 2023-11-25: qty 2

## 2023-11-25 MED ORDER — OXYMETAZOLINE HCL 0.05 % NA SOLN
NASAL | Status: DC | PRN
  Administered 2023-11-25: 1 via TOPICAL

## 2023-11-25 MED ORDER — OXYCODONE HCL 5 MG PO TABS
ORAL_TABLET | ORAL | Status: AC
  Filled 2023-11-25: qty 1

## 2023-11-25 MED ORDER — OXYCODONE HCL 5 MG PO TABS
5.0000 mg | ORAL_TABLET | Freq: Once | ORAL | Status: AC | PRN
  Administered 2023-11-25: 5 mg via ORAL

## 2023-11-25 MED ORDER — ROCURONIUM BROMIDE 100 MG/10ML IV SOLN
INTRAVENOUS | Status: DC | PRN
  Administered 2023-11-25: 60 mg via INTRAVENOUS

## 2023-11-25 MED ORDER — CEFAZOLIN SODIUM-DEXTROSE 2-4 GM/100ML-% IV SOLN
INTRAVENOUS | Status: AC
  Filled 2023-11-25: qty 100

## 2023-11-25 MED ORDER — FENTANYL CITRATE (PF) 100 MCG/2ML IJ SOLN
INTRAMUSCULAR | Status: AC
  Filled 2023-11-25: qty 2

## 2023-11-25 MED ORDER — DEXAMETHASONE SODIUM PHOSPHATE 4 MG/ML IJ SOLN
INTRAMUSCULAR | Status: DC | PRN
  Administered 2023-11-25: 10 mg via INTRAVENOUS

## 2023-11-25 MED ORDER — ROCURONIUM BROMIDE 10 MG/ML (PF) SYRINGE
PREFILLED_SYRINGE | INTRAVENOUS | Status: AC
  Filled 2023-11-25: qty 10

## 2023-11-25 MED ORDER — LIDOCAINE HCL (CARDIAC) PF 100 MG/5ML IV SOSY
PREFILLED_SYRINGE | INTRAVENOUS | Status: DC | PRN
  Administered 2023-11-25: 60 mg via INTRAVENOUS

## 2023-11-25 MED ORDER — MIDAZOLAM HCL 2 MG/2ML IJ SOLN
INTRAMUSCULAR | Status: AC
  Filled 2023-11-25: qty 2

## 2023-11-25 MED ORDER — FENTANYL CITRATE (PF) 100 MCG/2ML IJ SOLN
INTRAMUSCULAR | Status: DC | PRN
  Administered 2023-11-25 (×2): 50 ug via INTRAVENOUS
  Administered 2023-11-25: 100 ug via INTRAVENOUS

## 2023-11-25 MED ORDER — LIDOCAINE 2% (20 MG/ML) 5 ML SYRINGE
INTRAMUSCULAR | Status: AC
  Filled 2023-11-25: qty 5

## 2023-11-25 MED ORDER — FENTANYL CITRATE (PF) 100 MCG/2ML IJ SOLN
25.0000 ug | INTRAMUSCULAR | Status: DC | PRN
  Administered 2023-11-25: 50 ug via INTRAVENOUS

## 2023-11-25 MED ORDER — CELECOXIB 200 MG PO CAPS
200.0000 mg | ORAL_CAPSULE | Freq: Once | ORAL | Status: DC
Start: 2023-11-25 — End: 2023-11-25

## 2023-11-25 MED ORDER — GLYCOPYRROLATE 0.2 MG/ML IJ SOLN
INTRAMUSCULAR | Status: DC | PRN
  Administered 2023-11-25: .2 mg via INTRAVENOUS

## 2023-11-25 MED ORDER — 0.9 % SODIUM CHLORIDE (POUR BTL) OPTIME
TOPICAL | Status: DC | PRN
Start: 2023-11-25 — End: 2023-11-25
  Administered 2023-11-25: 1000 mL

## 2023-11-25 SURGICAL SUPPLY — 26 items
BLADE INF TURB ROT M4 2 5PK (BLADE) IMPLANT
BLADE SHAVER TURBINATE 11X2.9 (BLADE) IMPLANT
CANISTER SUCT 1200ML W/VALVE (MISCELLANEOUS) ×1 IMPLANT
COAGULATOR SUCT 8FR VV (MISCELLANEOUS) IMPLANT
COAGULATOR SUCT SWTCH 10FR 6 (ELECTROSURGICAL) IMPLANT
CORD BIPOLAR FORCEPS 12FT (ELECTRODE) IMPLANT
DRSG TELFA 3X8 NADH STRL (GAUZE/BANDAGES/DRESSINGS) IMPLANT
ELECTRODE REM PT RTRN 9FT ADLT (ELECTROSURGICAL) ×1 IMPLANT
FORCEPS BIPOLAR SPETZLER 8 1.0 (NEUROSURGERY SUPPLIES) IMPLANT
GAUZE SPONGE 2X2 STRL 8-PLY (GAUZE/BANDAGES/DRESSINGS) ×1 IMPLANT
GLOVE BIO SURGEON STRL SZ7 (GLOVE) ×1 IMPLANT
GOWN STRL REUS W/ TWL LRG LVL3 (GOWN DISPOSABLE) ×2 IMPLANT
IV SET EXT 30 76VOL 4 MALE LL (IV SETS) IMPLANT
NDL PRECISIONGLIDE 27X1.5 (NEEDLE) ×1 IMPLANT
NEEDLE PRECISIONGLIDE 27X1.5 (NEEDLE) ×1 IMPLANT
NS IRRIG 1000ML POUR BTL (IV SOLUTION) ×1 IMPLANT
PACK BASIN DAY SURGERY FS (CUSTOM PROCEDURE TRAY) ×1 IMPLANT
PACK ENT DAY SURGERY (CUSTOM PROCEDURE TRAY) ×1 IMPLANT
PATTIES SURGICAL .5 X3 (DISPOSABLE) ×1 IMPLANT
SLEEVE SCD COMPRESS KNEE MED (STOCKING) ×1 IMPLANT
SPLINT NASAL AIRWAY SILICONE (MISCELLANEOUS) IMPLANT
SUT CHROMIC 4 0 RB 1X27 (SUTURE) ×1 IMPLANT
SUT MNCRL AB 4-0 PS2 18 (SUTURE) IMPLANT
SUT PLAIN 4 0 ~~LOC~~ 1 (SUTURE) IMPLANT
TOWEL GREEN STERILE FF (TOWEL DISPOSABLE) ×1 IMPLANT
TUBE SALEM SUMP 16F (TUBING) ×1 IMPLANT

## 2023-11-25 NOTE — Anesthesia Procedure Notes (Signed)
 Procedure Name: Intubation Date/Time: 11/25/2023 11:55 AM  Performed by: Lucky Sable, CRNAPre-anesthesia Checklist: Patient identified, Emergency Drugs available, Suction available, Patient being monitored and Timeout performed Patient Re-evaluated:Patient Re-evaluated prior to induction Oxygen  Delivery Method: Circle system utilized Preoxygenation: Pre-oxygenation with 100% oxygen  Induction Type: IV induction Ventilation: Mask ventilation without difficulty Laryngoscope Size: Mac and 3 Grade View: Grade II Tube type: Oral Tube size: 7.5 mm Number of attempts: 1 Airway Equipment and Method: Stylet and Oral airway Placement Confirmation: ETT inserted through vocal cords under direct vision, positive ETCO2, breath sounds checked- equal and bilateral and CO2 detector Secured at: 23 cm Tube secured with: Tape Dental Injury: Teeth and Oropharynx as per pre-operative assessment

## 2023-11-25 NOTE — H&P (Signed)
 Eric Woods is an 48 y.o. male.    Chief Complaint:  Nasal obstruction  HPI: Patient presents today for planned elective procedure. He continues to have significant nasal obstruction.  He denies any interval change in history since office visit on 10/07/23  Past Medical History:  Diagnosis Date   Deviated septum    GERD (gastroesophageal reflux disease)    Infection of skin due to methicillin resistant Staphylococcus aureus (MRSA)    Polysubstance abuse (HCC)    Tobacco dependence     Past Surgical History:  Procedure Laterality Date   ABDOMINAL SURGERY     HAND SURGERY     HERNIA REPAIR  01/02/2023   I & D EXTREMITY Right 12/19/2021   Procedure: IRRIGATION AND DEBRIDEMENT EXTREMITY;  Surgeon: Ltanya Rummer, MD;  Location: [facility] OR;  Service: Orthopedics;  Laterality: Right;   IRRIGATION AND DEBRIDEMENT KNEE Left 02/17/2022   Procedure: IRRIGATION AND DEBRIDEMENT KNEE;  Surgeon: Micheline Ahr, MD;  Location: [facility] OR;  Service: Orthopedics;  Laterality: Left;   TEE WITHOUT CARDIOVERSION N/A 02/18/2022   Procedure: TRANSESOPHAGEAL ECHOCARDIOGRAM (TEE);  Surgeon: Hazle Lites, MD;  Location: [facility] ENDOSCOPY;  Service: Cardiovascular;  Laterality: N/A;    History reviewed. No pertinent family history.  Social History:  reports that he has been smoking e-cigarettes. He does not have any smokeless tobacco history on file. He reports current alcohol use. He reports that he does not currently use drugs after having used the following drugs: Marijuana.  Allergies: No Known Allergies  No medications prior to admission.    No results found for this or any previous visit (from the past 48 hours). No results found.  ROS: negative other than stated in HPI  Height 6' (1.829 m), weight 81.6 kg.  PHYSICAL EXAM: General: Resting comfortably in NAD Nose: moderate right septal deviation with turbinate hypertrophy Lungs: Non-labored respiratinos     Assessment/Plan  Nasal  obstruction - Risks and benefits of septoplasty and turbinate reduction were discussed and he would like to proceed    @SHSIG @ 11/25/2023, 7:24 AM

## 2023-11-25 NOTE — Discharge Instructions (Signed)

## 2023-11-25 NOTE — Transfer of Care (Signed)
 Immediate Anesthesia Transfer of Care Note  Patient: Eric Woods  Procedure(s) Performed: Procedure(s) (LRB): SEPTOPLASTY AND BILATERAL INFERIOR TURBINATE REDUCTION (Bilateral)  Patient Location: PACU  Anesthesia Type: GA  Level of Consciousness: awake, sedated, patient cooperative and responds to stimulation, sleepy stable   Airway & Oxygen  Therapy: Patient Spontanous Breathing and Patient connected to FM oxygen   Post-op Assessment: Report given to PACU RN, Post -op Vital signs reviewed and stable and Patient sleepy   Post vital signs: Reviewed and stable  Complications: No apparent anesthesia complications

## 2023-11-25 NOTE — Anesthesia Postprocedure Evaluation (Signed)
 Anesthesia Post Note  Patient: Eric Woods  Procedure(s) Performed: SEPTOPLASTY AND BILATERAL INFERIOR TURBINATE REDUCTION (Bilateral)     Patient location during evaluation: PACU Anesthesia Type: General Level of consciousness: awake and alert Pain management: pain level controlled Vital Signs Assessment: post-procedure vital signs reviewed and stable Respiratory status: spontaneous breathing, nonlabored ventilation, respiratory function stable and patient connected to nasal cannula oxygen  Cardiovascular status: blood pressure returned to baseline and stable Postop Assessment: no apparent nausea or vomiting Anesthetic complications: no   No notable events documented.  Last Vitals:  Vitals:   11/25/23 1330 11/25/23 1356  BP: (!) 143/92 (!) 147/84  Pulse: 62 68  Resp: 14 16  Temp:  (!) 36.3 C  SpO2: 95% 97%    Last Pain:  Vitals:   11/25/23 1400  TempSrc:   PainSc: 4                  Bejamin Hackbart P Jasmene Goswami

## 2023-11-25 NOTE — Op Note (Signed)
 OPERATIVE NOTE  Eric Woods Date/Time of Admission: 11/25/2023  9:08 AM  CSN: [id number];MRN:[id number] Attending Provider: Everardo Hitch, MD Room/Bed: [facility]/NONE DOB: 02/19/1976 Age: 48 y.o.   Pre-Op Diagnosis: Deviated nasal septum Hypertrophy of inferior nasal turbinate  Post-Op Diagnosis: Deviated nasal septum Hypertrophy of inferior nasal turbinate  Procedure: Procedure(s): SEPTOPLASTY AND BILATERAL INFERIOR TURBINATE REDUCTION  Anesthesia: General  Surgeon(s): Zach Rashon Westrup, MD  Staff: Circulator: Jil Mosses, RN; Theodoro Fisherman, RN Scrub Person: Caviness, Pattie T, RN  Implants: * No implants in log *  Specimens: * No specimens in log *  Complications: None  EBL: 50 ML  Condition: stable  Operative Findings:  Severe right septal deviation, inferior turbinate hypertrophy more in the left  Description of Operation: After informed consent the patient was brought back to the operating room and intubated by anesthesia.  The nose was decongested with Afrin-soaked cottonoids and the septum and turbinates were injected with 0.25% Marcaine  with epinephrine .  Then starting on the left side a hemitransfixion incision was made in the ipsilateral mucoperichondrial flap was then raised.  A vertical incision was made along the septum and a contralateral flap was then raised maintaining an adequate dorsal strut.  There was significant traumatic fracturing of the septum and the flaps were carefully elevated around this.  Next superior and inferior incisions were made along the septum and all of the deviated portions of cartilage and bone were then removed.  The hemitransfixion incision and mattressing septal suture were placed with 4-0 chromic suture.  Then a stab incision was made at the head of both inferior turbinates.  Starting with the left side a submucosal flap was elevated and the bone eggshell fractured laterally.  Then using a microdebrider submucosal  tissue was resected.  The right side was then addressed and a mucoperiosteal flap was then elevated the bone fracture laterally and submucosal tissue was removed with a microdebrider.  Cottonoids were used for any congestion in the stomach was suctioned out and the patient then transferred over to anesthesia.

## 2023-11-25 NOTE — Anesthesia Preprocedure Evaluation (Signed)
 Anesthesia Evaluation  Patient identified by MRN, date of birth, ID band Patient awake    Reviewed: Allergy & Precautions, NPO status , Patient's Chart, lab work & pertinent test results  Airway Mallampati: II  TM Distance: >3 FB Neck ROM: Full    Dental no notable dental hx.    Pulmonary neg pulmonary ROS, Current Smoker   Pulmonary exam normal        Cardiovascular negative cardio ROS  Rhythm:Regular Rate:Normal     Neuro/Psych negative neurological ROS  negative psych ROS   GI/Hepatic Neg liver ROS,GERD  Medicated,,  Endo/Other  negative endocrine ROS    Renal/GU negative Renal ROS  negative genitourinary   Musculoskeletal  (+) Arthritis , Osteoarthritis,    Abdominal Normal abdominal exam  (+)   Peds  Hematology negative hematology ROS (+)   Anesthesia Other Findings   Reproductive/Obstetrics                             Anesthesia Physical Anesthesia Plan  ASA: 2  Anesthesia Plan: General   Post-op Pain Management: Tylenol  PO (pre-op)* and Celebrex  PO (pre-op)*   Induction: Intravenous  PONV Risk Score and Plan: Ondansetron , Dexamethasone , Midazolam  and Treatment may vary due to age or medical condition  Airway Management Planned: Mask and Oral ETT  Additional Equipment: None  Intra-op Plan:   Post-operative Plan: Extubation in OR  Informed Consent: I have reviewed the patients History and Physical, chart, labs and discussed the procedure including the risks, benefits and alternatives for the proposed anesthesia with the patient or authorized representative who has indicated his/her understanding and acceptance.     Dental advisory given  Plan Discussed with: CRNA  Anesthesia Plan Comments:        Anesthesia Quick Evaluation

## 2023-11-26 ENCOUNTER — Encounter (HOSPITAL_BASED_OUTPATIENT_CLINIC_OR_DEPARTMENT_OTHER): Payer: Self-pay | Admitting: Otolaryngology

## 2024-01-15 ENCOUNTER — Other Ambulatory Visit: Payer: Self-pay | Admitting: Family Medicine

## 2024-01-15 ENCOUNTER — Encounter: Payer: Self-pay | Admitting: Family Medicine

## 2024-01-15 DIAGNOSIS — G629 Polyneuropathy, unspecified: Secondary | ICD-10-CM

## 2024-01-15 DIAGNOSIS — G894 Chronic pain syndrome: Secondary | ICD-10-CM

## 2024-01-16 ENCOUNTER — Other Ambulatory Visit: Payer: Self-pay | Admitting: Family Medicine

## 2024-01-16 ENCOUNTER — Encounter: Payer: Self-pay | Admitting: Family Medicine

## 2024-01-16 ENCOUNTER — Ambulatory Visit: Payer: Self-pay | Admitting: Family Medicine

## 2024-01-16 VITALS — BP 132/87 | HR 90 | Ht 72.0 in | Wt 188.0 lb

## 2024-01-16 DIAGNOSIS — G629 Polyneuropathy, unspecified: Secondary | ICD-10-CM

## 2024-01-16 DIAGNOSIS — T148XXA Other injury of unspecified body region, initial encounter: Secondary | ICD-10-CM | POA: Insufficient documentation

## 2024-01-16 DIAGNOSIS — M542 Cervicalgia: Secondary | ICD-10-CM | POA: Diagnosis not present

## 2024-01-16 MED ORDER — GABAPENTIN 600 MG PO TABS: 600.0000 mg | ORAL_TABLET | Freq: Three times a day (TID) | ORAL | 0 refills | Status: DC

## 2024-01-16 NOTE — Progress Notes (Signed)
 Established Patient Office Visit  Subjective:  Patient ID: Eric Woods, male    DOB: 1975-08-20  Age: 48 y.o. MRN: [id number]  CC:  Chief Complaint  Patient presents with   Medical Management of Chronic Issues    Pt here for medication Refill and also states right base of the neck and shoulder blade is in some pain, states he might have slept on it wrong     HPI Eric Woods is a 48 y.o. male with a past medical history of neuropathy, presents for follow-up of chronic medical conditions and evaluation of new symptoms as noted above. She reports that the symptoms began 2 days ago, with no numbness, tingling, recent injury, or trauma. She notes relief with ice application, and her range of motion is intact.  Past Medical History:  Diagnosis Date   Deviated septum    GERD (gastroesophageal reflux disease)    Infection of skin due to methicillin resistant Staphylococcus aureus (MRSA)    Polysubstance abuse (HCC)    Tobacco dependence     Past Surgical History:  Procedure Laterality Date   ABDOMINAL SURGERY     HAND SURGERY     HERNIA REPAIR  01/02/2023   I & D EXTREMITY Right 12/19/2021   Procedure: IRRIGATION AND DEBRIDEMENT EXTREMITY;  Surgeon: Alyse Lynwood, MD;  Location: [facility] OR;  Service: Orthopedics;  Laterality: Right;   IRRIGATION AND DEBRIDEMENT KNEE Left 02/17/2022   Procedure: IRRIGATION AND DEBRIDEMENT KNEE;  Surgeon: Cristy Bonner DASEN, MD;  Location: [facility] OR;  Service: Orthopedics;  Laterality: Left;   NASAL SEPTOPLASTY W/ TURBINOPLASTY Bilateral 11/25/2023   Procedure: SEPTOPLASTY AND BILATERAL INFERIOR TURBINATE REDUCTION;  Surgeon: Maggie Hussar, MD;  Location: [facility];  Service: ENT;  Laterality: Bilateral;  SEPTOPLASTY AND BILATERAL INFERIOR TURBINATE REDUCTION   TEE WITHOUT CARDIOVERSION N/A 02/18/2022   Procedure: TRANSESOPHAGEAL ECHOCARDIOGRAM (TEE);  Surgeon: Mona Vinie BROCKS, MD;  Location: [facility] ENDOSCOPY;  Service: Cardiovascular;   Laterality: N/A;    No family history on file.  Social History   Socioeconomic History   Marital status: Married    Spouse name: Brooke   Number of children: Not on file   Years of education: Not on file   Highest education level: Not on file  Occupational History   Occupation: unemployed  Tobacco Use   Smoking status: Some Days    Types: E-cigarettes   Smokeless tobacco: Not on file   Tobacco comments:    2 packs a week  Vaping Use   Vaping status: Every Day   Substances: Mixture of cannabinoids  Substance and Sexual Activity   Alcohol use: Yes    Comment: not his drug of choice   Drug use: Not Currently    Types: Marijuana    Comment: last time today (at 0800)   Sexual activity: Yes  Other Topics Concern   Not on file  Social History Narrative   Not on file   Social Drivers of Health   Financial Resource Strain: Not on file  Food Insecurity: No Food Insecurity (02/15/2022)   Hunger Vital Sign    Worried About Running Out of Food in the Last Year: Never true    Ran Out of Food in the Last Year: Never true  Transportation Needs: No Transportation Needs (02/15/2022)   PRAPARE - Administrator, Civil Service (Medical): No    Lack of Transportation (Non-Medical): No  Physical Activity: Not on file  Stress: Not on file  Social Connections:  Not on file  Intimate Partner Violence: Not At Risk (02/15/2022)   Humiliation, Afraid, Rape, and Kick questionnaire    Fear of Current or Ex-Partner: No    Emotionally Abused: No    Physically Abused: No    Sexually Abused: No    Outpatient Medications Prior to Visit  Medication Sig Dispense Refill   Buprenorphine HCl-Naloxone HCl (SUBOXONE) 8-2 MG FILM Place under the tongue.     cetirizine  (ZYRTEC ) 10 MG tablet Take 1 tablet (10 mg total) by mouth daily. 30 tablet 11   Cholecalciferol (VITAMIN D3) 25 MCG (1000 UT) CAPS Take 1 capsule (1,000 Units total) by mouth daily. 60 capsule 1   fluticasone  (FLONASE ) 50  MCG/ACT nasal spray Place 2 sprays into both nostrils 2 (two) times daily. 16 g 6   omeprazole  (PRILOSEC) 20 MG capsule Take 1 capsule (20 mg total) by mouth daily. 30 capsule 3   gabapentin  (NEURONTIN ) 400 MG capsule TAKE 1 CAPSULE(400 MG) BY MOUTH THREE TIMES DAILY 90 capsule 0   ibuprofen  (ADVIL ) 800 MG tablet Take 1 tablet (800 mg total) by mouth 3 (three) times daily. (Patient not taking: Reported on 11/20/2023) 21 tablet 0   meloxicam  (MOBIC ) 7.5 MG tablet Take 1 tablet (7.5 mg total) by mouth daily. (Patient not taking: Reported on 11/20/2023) 30 tablet 0   mupirocin  ointment (BACTROBAN ) 2 % Apply 1 Application topically 2 (two) times daily. (Patient not taking: Reported on 03/31/2023) 22 g 0   No facility-administered medications prior to visit.    No Known Allergies  ROS Review of Systems  Constitutional:  Negative for fatigue and fever.  Eyes:  Negative for visual disturbance.  Respiratory:  Negative for chest tightness and shortness of breath.   Cardiovascular:  Negative for chest pain and palpitations.  Musculoskeletal:  Positive for arthralgias.  Neurological:  Negative for dizziness and headaches.      Objective:    Physical Exam HENT:     Head: Normocephalic.     Right Ear: External ear normal.     Left Ear: External ear normal.     Nose: No congestion or rhinorrhea.     Mouth/Throat:     Mouth: Mucous membranes are moist.  Cardiovascular:     Rate and Rhythm: Regular rhythm.     Heart sounds: No murmur heard. Pulmonary:     Effort: No respiratory distress.     Breath sounds: Normal breath sounds.  Musculoskeletal:     Right shoulder: No deformity, effusion, laceration or tenderness. Normal range of motion.     Left shoulder: No deformity, effusion, laceration or tenderness. Normal range of motion.  Neurological:     Mental Status: He is alert.     BP 132/87   Pulse 90   Ht 6' (1.829 m)   Wt 188 lb (85.3 kg)   SpO2 95%   BMI 25.50 kg/m  Wt Readings  from Last 3 Encounters:  01/16/24 188 lb (85.3 kg)  11/25/23 183 lb 3.2 oz (83.1 kg)  03/31/23 189 lb (85.7 kg)    Lab Results  Component Value Date   TSH 1.443 12/24/2021   Lab Results  Component Value Date   WBC 6.2 06/01/2022   HGB 13.6 06/01/2022   HCT 39.8 06/01/2022   MCV 94.8 06/01/2022   PLT 323 06/01/2022   Lab Results  Component Value Date   NA 138 06/01/2022   K 3.8 06/01/2022   CO2 27 06/01/2022   GLUCOSE 120 (H) 06/01/2022  BUN 11 06/01/2022   CREATININE 0.94 06/01/2022   BILITOT 0.6 06/01/2022   ALKPHOS 51 06/01/2022   AST 33 06/01/2022   ALT 22 06/01/2022   PROT 6.9 06/01/2022   ALBUMIN 4.0 06/01/2022   CALCIUM  9.1 06/01/2022   ANIONGAP 6 06/01/2022   No results found for: CHOL No results found for: HDL No results found for: LDLCALC No results found for: TRIG No results found for: CHOLHDL No results found for: YHAJ8R    Assessment & Plan:  Muscle strain Assessment & Plan: Encouraged Nonpharmacologic intervention including Warm Compresses: Apply a warm compress or heating pad to the affected area for 15-20 minutes, 2-3 times daily to help relax tense muscles and improve blood flow.  Gentle Stretching: Engage in gentle neck and shoulder stretches to relieve muscle tension and promote flexibility. Avoid any movements that cause sharp pain.  Activity Modification: Avoid activities that aggravate the pain, such as heavy lifting, repetitive motions, or maintaining poor posture for extended periods.  Sleep Positioning: Use proper sleeping posture with supportive pillows to reduce strain and prevent recurrence of muscle tension.  Ergonomic Posture: Maintain good ergonomic posture throughout the day, particularly when using electronic devices or sitting for long periods. Ensure that workstations are set up to support a neutral spine and relaxed shoulders.    Neuropathy Assessment & Plan: The patient reports minimal relief with the  current dose of gabapentin . We will increase the dosage today and have advised the patient to monitor for potential side effects, including: Drowsiness or dizziness, Fatigue, Swelling in the legs or feet, Mood changes or confusion. The patient was instructed to report any concerning symptoms and follow up as needed for further evaluation.   Orders: -     Gabapentin ; Take 1 tablet (600 mg total) by mouth 3 (three) times daily.  Dispense: 90 tablet; Refill: 0  Note: This chart has been completed using Engineer, civil (consulting) software, and while attempts have been made to ensure accuracy, certain words and phrases may not be transcribed as intended.    Follow-up: No follow-ups on file.   Jaysen Wey, FNP

## 2024-01-16 NOTE — Patient Instructions (Addendum)
 I appreciate the opportunity to provide care to you today!  Muscle Strain Care:  Warm Compresses: Apply a warm compress or heating pad to the affected area for 15-20 minutes, 2-3 times daily to help relax tense muscles and improve blood flow.  Gentle Stretching: Engage in gentle neck and shoulder stretches to relieve muscle tension and promote flexibility. Avoid any movements that cause sharp pain.  Activity Modification: Avoid activities that aggravate the pain, such as heavy lifting, repetitive motions, or maintaining poor posture for extended periods.  Sleep Positioning: Use proper sleeping posture with supportive pillows to reduce strain and prevent recurrence of muscle tension.  Ergonomic Posture: Maintain good ergonomic posture throughout the day, particularly when using electronic devices or sitting for long periods. Ensure that workstations are set up to support a neutral spine and relaxed shoulders.   Please follow up if your symptoms worsen or fail to improve.    Please continue to a heart-healthy diet and increase your physical activities. Try to exercise for at least five days a week.    It was a pleasure to see you and I look forward to continuing to work together on your health and well-being. Please do not hesitate to call the office if you need care or have questions about your care.  In case of emergency, please visit the Emergency Department for urgent care, or contact our clinic at [phone] to schedule an appointment. We're here to help you!   Have a wonderful day and week. With Gratitude, Ameira Alessandrini MSN, FNP-BC

## 2024-01-16 NOTE — Assessment & Plan Note (Signed)
 Encouraged Nonpharmacologic intervention including Warm Compresses: Apply a warm compress or heating pad to the affected area for 15-20 minutes, 2-3 times daily to help relax tense muscles and improve blood flow.  Gentle Stretching: Engage in gentle neck and shoulder stretches to relieve muscle tension and promote flexibility. Avoid any movements that cause sharp pain.  Activity Modification: Avoid activities that aggravate the pain, such as heavy lifting, repetitive motions, or maintaining poor posture for extended periods.  Sleep Positioning: Use proper sleeping posture with supportive pillows to reduce strain and prevent recurrence of muscle tension.  Ergonomic Posture: Maintain good ergonomic posture throughout the day, particularly when using electronic devices or sitting for long periods. Ensure that workstations are set up to support a neutral spine and relaxed shoulders.

## 2024-01-16 NOTE — Assessment & Plan Note (Addendum)
 The patient reports minimal relief with the current dose of gabapentin . We will increase the dosage today and have advised the patient to monitor for potential side effects, including: Drowsiness or dizziness, Fatigue, Swelling in the legs or feet, Mood changes or confusion. The patient was instructed to report any concerning symptoms and follow up as needed for further evaluation.

## 2024-01-19 ENCOUNTER — Encounter: Payer: Self-pay | Admitting: Family Medicine

## 2024-01-20 ENCOUNTER — Other Ambulatory Visit: Payer: Self-pay

## 2024-01-20 DIAGNOSIS — G629 Polyneuropathy, unspecified: Secondary | ICD-10-CM

## 2024-01-20 MED ORDER — GABAPENTIN 600 MG PO TABS: 600.0000 mg | ORAL_TABLET | Freq: Three times a day (TID) | ORAL | 0 refills | Status: DC

## 2024-01-20 NOTE — Telephone Encounter (Signed)
 Sent to correct pharmacy

## 2024-02-27 ENCOUNTER — Other Ambulatory Visit: Payer: Self-pay | Admitting: Family Medicine

## 2024-02-27 DIAGNOSIS — G629 Polyneuropathy, unspecified: Secondary | ICD-10-CM

## 2024-03-02 ENCOUNTER — Ambulatory Visit: Admitting: Family Medicine

## 2024-03-02 VITALS — BP 111/76 | HR 78 | Temp 97.9°F | Ht 72.0 in | Wt 184.0 lb

## 2024-03-02 DIAGNOSIS — L739 Follicular disorder, unspecified: Secondary | ICD-10-CM

## 2024-03-02 MED ORDER — DOXYCYCLINE HYCLATE 100 MG PO TABS: 100.0000 mg | ORAL_TABLET | Freq: Two times a day (BID) | ORAL | 0 refills | Status: DC

## 2024-03-02 MED ORDER — MUPIROCIN 2 % EX OINT: TOPICAL_OINTMENT | CUTANEOUS | 0 refills | Status: DC

## 2024-03-02 MED ORDER — AZELASTINE HCL 0.1 % NA SOLN: 2.0000 | Freq: Two times a day (BID) | NASAL | 12 refills | Status: AC

## 2024-03-02 NOTE — Progress Notes (Signed)
   Subjective:    Patient ID: Eric Woods, male    DOB: 12/25/1975, 48 y.o.   MRN: [id number]  HPI Very nice patient Has had multiple bumps on his leg 1 mom came to a pustule and drained a lot of pus still tender but now doing better Also has 1 area underneath his scrotum near the anus that is a bump that is tender and painful He states he has had a couple different times he was hospitalized with staph infections Red bumps on thighs and legs 1 month  Pt concerns with staph infection  Review of Systems     Objective:   Physical Exam  Multiple folliculitis on the legs with 1 area that is tender but not an abscess He also has an area behind the scrotum in the perianal region that is a small folliculitis but no abscess  There is no draining lesions or fluctuant lesions therefore no I&D also no culture taken     Assessment & Plan:   Folliculitis Probable staph Doxycycline  twice daily for the next 10 to 14 days take with a snack tall glass of water Warning signs discussed Bactroban  ointment to nasal opening twice daily over the course of the next 10 to 14 days Antibacterial soap washing once or twice a week Proper warm compresses discussed Follow-up if progressive troubles Recheck if any issues

## 2024-03-24 ENCOUNTER — Telehealth: Admitting: Family Medicine

## 2024-03-24 DIAGNOSIS — L739 Follicular disorder, unspecified: Secondary | ICD-10-CM

## 2024-03-24 MED ORDER — SULFAMETHOXAZOLE-TRIMETHOPRIM 800-160 MG PO TABS: 1.0000 | ORAL_TABLET | Freq: Two times a day (BID) | ORAL | 0 refills | Status: AC

## 2024-03-24 NOTE — Patient Instructions (Signed)
 Eric Woods, thank you for joining Roosvelt Mater, PA-C for today's virtual visit.  While this provider is not your primary care provider (PCP), if your PCP is located in our provider database this encounter information will be shared with them immediately following your visit.   A [facility] MyChart account gives you access to today's visit and all your visits, tests, and labs performed at [facility]  click here if you don't have a [facility] MyChart account or go to [email or web address]  Consent: (Patient) Eric Woods provided verbal consent for this virtual visit at the beginning of the encounter.  Current Medications:  Current Outpatient Medications:    sulfamethoxazole -trimethoprim  (BACTRIM  DS) 800-160 MG tablet, Take 1 tablet by mouth 2 (two) times daily for 7 days., Disp: 14 tablet, Rfl: 0   azelastine  (ASTELIN ) 0.1 % nasal spray, Place 2 sprays into both nostrils 2 (two) times daily., Disp: 30 mL, Rfl: 12   Buprenorphine HCl-Naloxone HCl (SUBOXONE) 8-2 MG FILM, Place under the tongue., Disp: , Rfl:    cetirizine  (ZYRTEC ) 10 MG tablet, Take 1 tablet (10 mg total) by mouth daily., Disp: 30 tablet, Rfl: 11   Cholecalciferol (VITAMIN D3) 25 MCG (1000 UT) CAPS, Take 1 capsule (1,000 Units total) by mouth daily., Disp: 60 capsule, Rfl: 1   fluticasone  (FLONASE ) 50 MCG/ACT nasal spray, Place 2 sprays into both nostrils 2 (two) times daily., Disp: 16 g, Rfl: 6   gabapentin  (NEURONTIN ) 600 MG tablet, TAKE 1 TABLET(600 MG) BY MOUTH THREE TIMES DAILY, Disp: 90 tablet, Rfl: 0   ibuprofen  (ADVIL ) 800 MG tablet, Take 1 tablet (800 mg total) by mouth 3 (three) times daily. (Patient not taking: Reported on 11/20/2023), Disp: 21 tablet, Rfl: 0   meloxicam  (MOBIC ) 7.5 MG tablet, Take 1 tablet (7.5 mg total) by mouth daily. (Patient not taking: Reported on 11/20/2023), Disp: 30 tablet, Rfl: 0   mupirocin  ointment (BACTROBAN ) 2 %, Apply thin amount to nostril bid for 2 weeks, Disp:  22 g, Rfl: 0   omeprazole  (PRILOSEC) 20 MG capsule, Take 1 capsule (20 mg total) by mouth daily., Disp: 30 capsule, Rfl: 3   Medications ordered in this encounter:  Meds ordered this encounter  Medications   sulfamethoxazole -trimethoprim  (BACTRIM  DS) 800-160 MG tablet    Sig: Take 1 tablet by mouth 2 (two) times daily for 7 days.    Dispense:  14 tablet    Refill:  0     *If you need refills on other medications prior to your next appointment, please contact your pharmacy*  Follow-Up: Call back or seek an in-person evaluation if the symptoms worsen or if the condition fails to improve as anticipated.  [facility] [phone]  Other Instructions Folliculitis  Folliculitis occurs when hair follicles become inflamed. A hair follicle is a tiny opening in your skin where your hair grows from. This condition often occurs on the scalp, thighs, legs, back, and buttocks but can happen anywhere on the body. What are the causes? A common cause of this condition is an infection from bacteria. The type of folliculitis caused by bacteria can last a long time or go away and come back. The bacteria can live anywhere on your skin. They are often found in the nostrils. Other causes may include: An infection from a fungus. An infection from a virus. Your skin touching some chemicals, such as oils and tars. Shaving or waxing. Greasy ointments or creams put on the skin. What increases the risk? You are  more likely to develop this condition if: Your body has a weak disease-fighting system (immune system). You have diabetes. You are obese. What are the signs or symptoms? Symptoms of this condition include: Redness. Soreness. Swelling. Itching. Small white or yellow, itchy spots filled with pus (pustules) that appear over a red area. If the infection goes deep into the follicle, these may turn into a boil (furuncle). A group of boils (carbuncle). These tend to form in hairy, sweaty  areas of the body. How is this diagnosed? This condition is diagnosed with a skin exam. Your health care provider may take a sample of one of the pustules or boils to test in a lab. How is this treated? This condition may be treated by: Putting a warm, wet cloth (warm compress) on the affected areas. Taking antibiotics or applying them to the skin. Applying or bathing with a solution that kills germs (antiseptic). Taking an over-the-counter medicine. This can help with itching. Having a procedure to drain pustules or boils. This may be done if a pustule or boil contains a lot of pus or fluid. Having laser hair removal. This may be done when the condition lasts for a long time. Follow these instructions at home: Managing pain and swelling  If directed, apply heat to the affected area as often as told by your health care provider. Use the heat source that your health care provider recommends, such as a moist heat pack or a heating pad. Place a towel between your skin and the heat source. Leave the heat on for 20-30 minutes. If your skin turns bright red, remove the heat right away to prevent burns. The risk of burns is higher if you cannot feel pain, heat, or cold. General instructions Take over-the-counter and prescription medicines only as told by your health care provider. If you were prescribed antibiotics, take or apply them as told by your health care provider. Do not stop using the antibiotic even if you start to feel better. Check your irritated area every day for signs of infection. Check for: More redness, swelling, or pain. Fluid or blood. Warmth. Pus or a bad smell. Do not shave irritated skin. Keep all follow-up visits. Your health care provider will check if the treatments are helping. Contact a health care provider if: You have a fever. You have any signs of infection. Red streaks are spreading from the affected area. This information is not intended to replace advice  given to you by your health care provider. Make sure you discuss any questions you have with your health care provider. Document Revised: 10/30/2021 Document Reviewed: 10/30/2021 Elsevier Patient Education  2024 Elsevier Inc.   If you have been instructed to have an in-person evaluation today at a local Urgent Care facility, please use the link below. It will take you to a list of all of our available [facility], including address, phone number and hours of operation. Please do not delay care.  [facility]  If you or a family member do not have a primary care provider, use the link below to schedule a visit and establish care. When you choose a [facility] primary care physician or advanced practice provider, you gain a long-term partner in health. Find a Primary Care Provider  Learn more about [facility]'s in-office and virtual care options: [facility] - Get Care Now

## 2024-03-24 NOTE — Progress Notes (Signed)
 Virtual Visit Consent   Eric Woods, you are scheduled for a virtual visit with a [facility] provider today. Just as with appointments in the office, your consent must be obtained to participate. Your consent will be active for this visit and any virtual visit you may have with one of our providers in the next 365 days. If you have a MyChart account, a copy of this consent can be sent to you electronically.  As this is a virtual visit, video technology does not allow for your provider to perform a traditional examination. This may limit your provider's ability to fully assess your condition. If your provider identifies any concerns that need to be evaluated in person or the need to arrange testing (such as labs, EKG, etc.), we will make arrangements to do so. Although advances in technology are sophisticated, we cannot ensure that it will always work on either your end or our end. If the connection with a video visit is poor, the visit may have to be switched to a telephone visit. With either a video or telephone visit, we are not always able to ensure that we have a secure connection.  By engaging in this virtual visit, you consent to the provision of healthcare and authorize for your insurance to be billed (if applicable) for the services provided during this visit. Depending on your insurance coverage, you may receive a charge related to this service.  I need to obtain your verbal consent now. Are you willing to proceed with your visit today? Eric Woods has provided verbal consent on 03/24/2024 for a virtual visit (video or telephone). Eric Woods, [place]  Date: 03/24/2024 3:43 PM   Virtual Visit via Video Note   Eric Woods, connected with  Eric Woods  ([id number], [birth year]) on 03/24/24 at  3:30 PM EDT by a video-enabled telemedicine application and verified that I am speaking with the correct person using two identifiers.  Location: Patient: Virtual Visit Location Patient:  Home Provider: Virtual Visit Location Provider: Home Office   I discussed the limitations of evaluation and management by telemedicine and the availability of in person appointments. The patient expressed understanding and agreed to proceed.    History of Present Illness: Eric Woods is a 48 y.o. who identifies as a male who was assigned male at birth, and is being seen today for c/o being seen by a doctor for a staph infection but has not gone away.  Pt states he last took antibiotics about 3-4 days ago.  Pt states he was taking Doxycycline .  Pt states he is concerned because he was in the hospital for having the infection go into his blood. And states he almost lost his life. Pt states he has pimples on his legs that become infected. 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   Umbilical hernia 08/27/2022   Lower urinary tract symptoms (LUTS) 07/23/2022   Neuropathy 07/23/2022   MRSA bacteremia 02/18/2022   Septic arthritis of knee, left (HCC) 02/18/2022   Abscess of forehead 02/18/2022   Cellulitis 02/15/2022   Infection of skin due to methicillin resistant Staphylococcus aureus (MRSA) 02/15/2022   Tobacco dependence 02/15/2022   Polysubstance abuse (HCC) 12/20/2021   Transaminitis 12/20/2021   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    Allergies: No Known  Allergies Medications:  Current Outpatient Medications:    sulfamethoxazole -trimethoprim  (BACTRIM  DS) 800-160 MG tablet, Take 1 tablet by mouth 2 (two) times daily for 7 days., Disp: 14 tablet, Rfl: 0   azelastine  (ASTELIN ) 0.1 % nasal spray, Place 2 sprays into both nostrils 2 (two) times daily., Disp: 30 mL, Rfl: 12   Buprenorphine HCl-Naloxone HCl (SUBOXONE) 8-2 MG FILM, Place under the tongue., Disp: , Rfl:    cetirizine   (ZYRTEC ) 10 MG tablet, Take 1 tablet (10 mg total) by mouth daily., Disp: 30 tablet, Rfl: 11   Cholecalciferol (VITAMIN D3) 25 MCG (1000 UT) CAPS, Take 1 capsule (1,000 Units total) by mouth daily., Disp: 60 capsule, Rfl: 1   fluticasone  (FLONASE ) 50 MCG/ACT nasal spray, Place 2 sprays into both nostrils 2 (two) times daily., Disp: 16 g, Rfl: 6   gabapentin  (NEURONTIN ) 600 MG tablet, TAKE 1 TABLET(600 MG) BY MOUTH THREE TIMES DAILY, Disp: 90 tablet, Rfl: 0   ibuprofen  (ADVIL ) 800 MG tablet, Take 1 tablet (800 mg total) by mouth 3 (three) times daily. (Patient not taking: Reported on 11/20/2023), Disp: 21 tablet, Rfl: 0   meloxicam  (MOBIC ) 7.5 MG tablet, Take 1 tablet (7.5 mg total) by mouth daily. (Patient not taking: Reported on 11/20/2023), Disp: 30 tablet, Rfl: 0   mupirocin  ointment (BACTROBAN ) 2 %, Apply thin amount to nostril bid for 2 weeks, Disp: 22 g, Rfl: 0   omeprazole  (PRILOSEC) 20 MG capsule, Take 1 capsule (20 mg total) by mouth daily., Disp: 30 capsule, Rfl: 3  Observations/Objective: Patient is well-developed, well-nourished in no acute distress.  Resting comfortably at home.  Head is normocephalic, atraumatic.  No labored breathing.  Speech is clear and coherent with logical content.  Patient is alert and oriented at baseline.    Assessment and Plan: 1. Folliculitis (Primary) - sulfamethoxazole -trimethoprim  (BACTRIM  DS) 800-160 MG tablet; Take 1 tablet by mouth 2 (two) times daily for 7 days.  Dispense: 14 tablet; Refill: 0  -Start Bactrim   -Pt advised to follow up with PCP or urgent care for worsening and continual symptoms   Follow Up Instructions: I discussed the assessment and treatment plan with the patient. The patient was provided an opportunity to ask questions and all were answered. The patient agreed with the plan and demonstrated an understanding of the instructions.  A copy of instructions were sent to the patient via MyChart unless otherwise noted below.     The patient was advised to call back or seek an in-person evaluation if the symptoms worsen or if the condition fails to improve as anticipated.    Eric Mater, PA-C

## 2024-03-29 ENCOUNTER — Other Ambulatory Visit: Payer: Self-pay | Admitting: Family Medicine

## 2024-03-29 ENCOUNTER — Encounter: Payer: Self-pay | Admitting: Family Medicine

## 2024-03-29 ENCOUNTER — Other Ambulatory Visit: Payer: Self-pay

## 2024-03-29 DIAGNOSIS — G629 Polyneuropathy, unspecified: Secondary | ICD-10-CM

## 2024-03-29 DIAGNOSIS — G894 Chronic pain syndrome: Secondary | ICD-10-CM

## 2024-03-29 MED ORDER — GABAPENTIN 600 MG PO TABS: 600.0000 mg | ORAL_TABLET | Freq: Three times a day (TID) | ORAL | 0 refills | Status: DC

## 2024-03-29 NOTE — Telephone Encounter (Signed)
 Sent to pharmacy

## 2024-04-19 ENCOUNTER — Other Ambulatory Visit: Payer: Self-pay | Admitting: Family Medicine

## 2024-04-19 DIAGNOSIS — L739 Follicular disorder, unspecified: Secondary | ICD-10-CM

## 2024-04-19 MED ORDER — MUPIROCIN 2 % EX OINT: TOPICAL_OINTMENT | CUTANEOUS | 0 refills | Status: DC

## 2024-04-19 NOTE — Telephone Encounter (Signed)
 Copied from CRM [id number]. Topic: Clinical - Medication Refill >> Apr 19, 2024  3:56 PM Tiffini S wrote: Medication:  mupirocin  ointment (BACTROBAN ) 2 % Has the patient contacted their pharmacy? Yes (Agent: If no, request that the patient contact the pharmacy for the refill. If patient does not wish to contact the pharmacy document the reason why and proceed with request.) (Agent: If yes, when and what did the pharmacy advise?)  This is the patient's preferred pharmacy:   [facility] [id number] - [place], [place] - [address] AT [facility] [address] [place] [address] Phone: [phone] Fax: [phone]  Is this the correct pharmacy for this prescription? Yes If no, delete pharmacy and type the correct one.   Has the prescription been filled recently? Yes  Is the patient out of the medication? Yes  Has the patient been seen for an appointment in the last year OR does the patient have an upcoming appointment? Yes  Can we respond through MyChart? No, Please call the patient at [phone]  Agent: Please be advised that Rx refills may take up to 3 business days. We ask that you follow-up with your pharmacy.

## 2024-04-20 ENCOUNTER — Other Ambulatory Visit: Payer: Self-pay | Admitting: Family Medicine

## 2024-04-22 ENCOUNTER — Other Ambulatory Visit: Payer: Self-pay

## 2024-04-22 ENCOUNTER — Encounter: Payer: Self-pay | Admitting: Family Medicine

## 2024-04-22 DIAGNOSIS — G629 Polyneuropathy, unspecified: Secondary | ICD-10-CM

## 2024-04-22 MED ORDER — GABAPENTIN 600 MG PO TABS: 600.0000 mg | ORAL_TABLET | Freq: Three times a day (TID) | ORAL | 0 refills | Status: DC

## 2024-05-13 ENCOUNTER — Other Ambulatory Visit: Payer: Self-pay | Admitting: Family Medicine

## 2024-05-17 ENCOUNTER — Telehealth: Admitting: Family Medicine

## 2024-05-17 ENCOUNTER — Other Ambulatory Visit: Payer: Self-pay

## 2024-05-17 ENCOUNTER — Ambulatory Visit: Payer: Self-pay

## 2024-05-17 DIAGNOSIS — G629 Polyneuropathy, unspecified: Secondary | ICD-10-CM

## 2024-05-17 DIAGNOSIS — H019 Unspecified inflammation of eyelid: Secondary | ICD-10-CM

## 2024-05-17 MED ORDER — GABAPENTIN 600 MG PO TABS: 600.0000 mg | ORAL_TABLET | Freq: Three times a day (TID) | ORAL | 0 refills | Status: DC

## 2024-05-17 NOTE — Progress Notes (Signed)
  Because your duration of symptoms and blurred vision you need an in person evaluation to check for infection of the eye lid, which can be serious and needs oral medications if that is what is going on. We feel your condition warrants further evaluation and  recommend that you be seen in a face-to-face visit.   NOTE: There will be NO CHARGE for this E-Visit   If you are having a true medical emergency, please call 911.

## 2024-05-17 NOTE — Progress Notes (Signed)
 [facility]   Needs in person given duration of infection and failure to heal Patient acknowledged agreement and understanding of the plan.

## 2024-05-17 NOTE — Telephone Encounter (Signed)
Noted patient scheduled

## 2024-05-17 NOTE — Progress Notes (Signed)
 Pt did not show up for visit-[facility]

## 2024-05-17 NOTE — Telephone Encounter (Signed)
 FYI Only or Action Required?: FYI only for provider: appointment scheduled on Virtual 05/17/24.  Patient was last seen in primary care on 05/17/2024 by Moishe Chiquita HERO, NP.  Called Nurse Triage reporting Rash.  Symptoms began several weeks ago.  Interventions attempted: Nothing.  Symptoms are: stable.  Triage Disposition: See HCP Within 4 Hours (Or PCP Triage)  Patient/caregiver understands and will follow disposition?: Yes Reason for Disposition  Large or small blisters on skin (i.e., fluid filled bubbles or sacs)  Answer Assessment - Initial Assessment Questions Patient's wife calling in today. States patient is prone to staph infections. Requesting virtual appointment.   1. APPEARANCE of RASH: What does the rash look like? (e.g., blisters, dry flaky skin, red spots, redness, sores)     Red, scrabby, sometimes clear pus drains from it  2. SIZE: How big are the spots? (e.g., tip of pen, eraser, coin; inches, centimeters)     Blueberry size  3. LOCATION: Where is the rash located?     1 on Left leg, 3 on right leg, 4 on back, 1 on right arm  4. COLOR: What color is the rash? (Note: It is difficult to assess rash color in people with darker-colored skin. When this situation occurs, simply ask the caller to describe what they see.)     Red  5. ONSET: When did the rash begin?     A few weeks ago  6. FEVER: Do you have a fever? If Yes, ask: What is your temperature, how was it measured, and when did it start?     Denies  7. ITCHING: Does the rash itch? If Yes, ask: How bad is the itch? (Scale 1-10; or mild, moderate, severe)     Denies  8. CAUSE: What do you think is causing the rash?     Unsure  9. MEDICINE FACTORS: Have you started any new medicines within the last 2 weeks? (e.g., antibiotics)      Denies  10. OTHER SYMPTOMS: Do you have any other symptoms? (e.g., dizziness, headache, sore throat, joint pain)       Sometimes mild nausea, bumps are  painful  Protocols used: Rash or Redness - Widespread-A-AH  Copied from CRM #[id number]. Topic: Clinical - Red Word Triage >> May 17, 2024 11:35 AM Delon HERO wrote: Red Word that prompted transfer to Nurse Triage: Patient wife is calling to report on leg arm, left leg, on back seen virtual 03/24/24 prescribed  Folliculitis (Primary) - sulfamethoxazole -trimethoprim  (BACTRIM  DS) 800-160 MG tablet;  Had an allergic reaction.  Pharmacy requested Pending Doxycycline  Hyclate 100 MG TAKE 1 TABLET(100 MG) BY MOUTH TWICE DAILY script is expired.  Patient stating that nothing is working [facility], previous staff infection.

## 2024-05-18 ENCOUNTER — Telehealth: Admitting: Family Medicine

## 2024-05-18 ENCOUNTER — Telehealth

## 2024-05-18 NOTE — Progress Notes (Signed)
 [place]   Would like referral for skin concerns Needs appt with PCP for this  Patient acknowledged agreement and understanding of the plan.

## 2024-05-27 ENCOUNTER — Encounter: Payer: Self-pay | Admitting: Emergency Medicine

## 2024-05-27 ENCOUNTER — Other Ambulatory Visit: Payer: Self-pay

## 2024-05-27 ENCOUNTER — Other Ambulatory Visit: Payer: Self-pay | Admitting: Family Medicine

## 2024-05-27 ENCOUNTER — Ambulatory Visit
Admission: EM | Admit: 2024-05-27 | Discharge: 2024-05-27 | Disposition: A | Discharge status: Home / Self Care | Attending: Nurse Practitioner | Admitting: Nurse Practitioner

## 2024-05-27 DIAGNOSIS — L739 Follicular disorder, unspecified: Secondary | ICD-10-CM

## 2024-05-27 DIAGNOSIS — L0889 Other specified local infections of the skin and subcutaneous tissue: Secondary | ICD-10-CM | POA: Diagnosis not present

## 2024-05-27 MED ORDER — CHLORHEXIDINE GLUCONATE 4 % EX SOLN: CUTANEOUS | 0 refills | Status: AC

## 2024-05-27 NOTE — Discharge Instructions (Addendum)
 Use the medication prescribed by your primary care physician. Use medication as prescribed. Keep the areas clean and dry.  Avoid scratching, manipulating, or picking at the areas while symptoms persist. Continue to monitor for worsening symptoms.  If you experience increased redness, swelling, foul-smelling drainage from the site, fever, or chills, seek care immediately.   For your foot: Apply medication as prescribed. Wear cotton or absorbent synthetic socks and change socks frequently. Wash socks after wearing with soap and water at temperatures >60C. Wash feet with soap and water at least once daily. Clean and thoroughly dry feet after exercise or bathing. Avoid prolonged wearing of occlusive shoes; preferentially wear nonocclusive shoes or ventilating occupational shoes. Wear properly fitted footwear to reduce friction on the feet.  If symptoms fail to improve with this treatment, follow-up with your primary care physician or in this clinic for further evaluation. Follow-up as needed.

## 2024-05-27 NOTE — ED Provider Notes (Signed)
 [facility]    CSN: [id number] Arrival date & time: 05/27/24  1203      History   Chief Complaint Chief Complaint  Patient presents with   Skin Problem    HPI Eric Woods is a 48 y.o. male.   The history is provided by the patient.   Patient presents for complaints of a possible staph infection to the right thigh that has been present for the past month.  Patient states that he was treated with an antibiotic ointment, but states his symptoms are not getting better.  He states that he keeps picking at the areas.  Denies fever, chills, foul-smelling drainage from the site, or redness.  States that he did reach out to his primary care office who prescribed mupirocin  for his symptoms.  Patient states that he was in prison and thinks that is where he may have contacted the staph infection.  He also presents for concerns of a maceration between the 4th and 5th toe of the left foot.  He states that the area is painful and flared up several weeks ago.  He states that he was told that this is due to the way his toes like in his shoes.  He denies redness, swelling, or foul-smelling drainage at the site.  Past Medical History:  Diagnosis Date   Deviated septum    GERD (gastroesophageal reflux disease)    Infection of skin due to methicillin resistant Staphylococcus aureus (MRSA)    Polysubstance abuse (HCC)    Tobacco dependence     Patient Active Problem List   Diagnosis Date Noted   Muscle strain 01/16/2024   Outbursts of anger 12/02/2022   Chronic pain 08/27/2022   Chronic congestion of paranasal sinus 08/27/2022   Umbilical hernia 08/27/2022   Lower urinary tract symptoms (LUTS) 07/23/2022   Neuropathy 07/23/2022   MRSA bacteremia 02/18/2022   Septic arthritis of knee, left (HCC) 02/18/2022   Abscess of forehead 02/18/2022   Cellulitis 02/15/2022   Infection of skin due to methicillin resistant Staphylococcus aureus (MRSA) 02/15/2022   Tobacco dependence  02/15/2022   Polysubstance abuse (HCC) 12/20/2021   Transaminitis 12/20/2021   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    Past Surgical History:  Procedure Laterality Date   ABDOMINAL SURGERY     HAND SURGERY     HERNIA REPAIR  01/02/2023   I & D EXTREMITY Right 12/19/2021   Procedure: IRRIGATION AND DEBRIDEMENT EXTREMITY;  Surgeon: Alyse Lynwood, MD;  Location: [facility] OR;  Service: Orthopedics;  Laterality: Right;   IRRIGATION AND DEBRIDEMENT KNEE Left 02/17/2022   Procedure: IRRIGATION AND DEBRIDEMENT KNEE;  Surgeon: Cristy Bonner DASEN, MD;  Location: [facility] OR;  Service: Orthopedics;  Laterality: Left;   NASAL SEPTOPLASTY W/ TURBINOPLASTY Bilateral 11/25/2023   Procedure: SEPTOPLASTY AND BILATERAL INFERIOR TURBINATE REDUCTION;  Surgeon: Maggie Hussar, MD;  Location: [facility];  Service: ENT;  Laterality: Bilateral;  SEPTOPLASTY AND BILATERAL INFERIOR TURBINATE REDUCTION   TEE WITHOUT CARDIOVERSION N/A 02/18/2022   Procedure: TRANSESOPHAGEAL ECHOCARDIOGRAM (TEE);  Surgeon: Mona Vinie BROCKS, MD;  Location: [facility] ENDOSCOPY;  Service: Cardiovascular;  Laterality: N/A;       Home Medications    Prior to Admission medications  Medication Sig Start Date End Date Taking? Authorizing Provider  azelastine  (ASTELIN ) 0.1 % nasal spray Place 2 sprays into both nostrils 2 (two) times daily. Patient not taking: Reported on 05/27/2024 03/02/24   Alphonsa Glendia LABOR, MD  Buprenorphine HCl-Naloxone HCl (SUBOXONE) 8-2 MG FILM Place under  the tongue.    [provider]  cetirizine  (ZYRTEC ) 10 MG tablet Take 1 tablet (10 mg total) by mouth daily. 03/31/23   Soldatova, Liuba, MD  Cholecalciferol (VITAMIN D3) 25 MCG (1000 UT) CAPS Take 1 capsule (1,000 Units total) by mouth daily. 10/21/22   Golda Lynwood PARAS, MD  fluticasone  (FLONASE ) 50 MCG/ACT nasal spray Place 2 sprays into both nostrils 2 (two) times daily. 03/31/23   Soldatova, Liuba, MD  gabapentin  (NEURONTIN ) 600 MG tablet Take 1 tablet  (600 mg total) by mouth 3 (three) times daily. 05/17/24   Bacchus, Meade PEDLAR, FNP  ibuprofen  (ADVIL ) 800 MG tablet Take 1 tablet (800 mg total) by mouth 3 (three) times daily. Patient not taking: Reported on 11/20/2023 10/05/22   Idol, Julie, PA-C  meloxicam  (MOBIC ) 7.5 MG tablet Take 1 tablet (7.5 mg total) by mouth daily. Patient not taking: Reported on 11/20/2023 09/05/22   Mayers, Cari S, PA-C  mupirocin  ointment (BACTROBAN ) 2 % APPLY THIN AMOUNT TO NOSTRIL TWICE DAILY X 2 WEEKS Patient taking differently: APPLY THIN AMOUNT TO NOSTRIL TWICE DAILY X 2 WEEKS 05/27/24   Bacchus, Meade PEDLAR, FNP  omeprazole  (PRILOSEC) 20 MG capsule Take 1 capsule (20 mg total) by mouth daily. 09/05/22   Mayers, Kirk RAMAN, PA-C    Family History History reviewed. No pertinent family history.  Social History Social History[1]   Allergies   Sulfa  antibiotics   Review of Systems Review of Systems Per HPI  Physical Exam Triage Vital Signs ED Triage Vitals  Encounter Vitals Group     BP 05/27/24 1230 129/83     Girls Systolic BP Percentile --      Girls Diastolic BP Percentile --      Boys Systolic BP Percentile --      Boys Diastolic BP Percentile --      Pulse Rate 05/27/24 1230 86     Resp 05/27/24 1230 20     Temp 05/27/24 1230 99.7 F (37.6 C)     Temp Source 05/27/24 1230 Oral     SpO2 05/27/24 1230 94 %     Weight --      Height --      Head Circumference --      Peak Flow --      Pain Score 05/27/24 1226 0     Pain Loc --      Pain Education --      Exclude from Growth Chart --    No data found.  Updated Vital Signs BP 129/83 (BP Location: Right Arm)   Pulse 86   Temp 99.7 F (37.6 C) (Oral)   Resp 20   SpO2 94%   Visual Acuity Right Eye Distance:   Left Eye Distance:   Bilateral Distance:    Right Eye Near:   Left Eye Near:    Bilateral Near:     Physical Exam Vitals and nursing note reviewed.  Constitutional:      General: He is not in acute distress.    Appearance:  Normal appearance.  HENT:     Head: Normocephalic.  Pulmonary:     Effort: Pulmonary effort is normal.  Skin:    General: Skin is warm and dry.     Findings: Lesion present.      Neurological:     General: No focal deficit present.     Mental Status: He is alert and oriented to person, place, and time.  Psychiatric:  Mood and Affect: Mood normal.        Behavior: Behavior normal.      UC Treatments / Results  Labs (all labs ordered are listed, but only abnormal results are displayed) Labs Reviewed - No data to display  EKG   Radiology No results found.  Procedures Procedures (including critical care time)  Medications Ordered in UC Medications - No data to display  Initial Impression / Assessment and Plan / UC Course  I have reviewed the triage vital signs and the nursing notes.  Pertinent labs & imaging results that were available during my care of the patient were reviewed by me and considered in my medical decision making (see chart for details).  Patient with 3 small indurations noted to the right thigh.  There is no obvious redness, swelling, or warmth to the affected areas.  With regard to the maceration of the left foot, there is no obvious swelling or erythema present.  Patient was prescribed mupirocin  by his PCP, patient advised to begin use of the mupirocin .  Patient was prescribed Hibiclens  4% solution to clean the affected areas twice daily.  Supportive care recommendations were provided and discussed with the patient to include discontinuing picking or disrupting the areas, keeping the areas clean and dry, and to monitor for worsening.  With regard to his foot, patient advised to use the mupirocin  to the area between his toes.  Further supportive care recommendations were given to the patient to include use of dry fit socks, keeping the feet clean and dry, and wearing properly fitted shoes.  Patient was in agreement with this plan of care and verbalizes  understanding.  All questions were answered.  Patient stable for discharge.   Final Clinical Impressions(s) / UC Diagnoses   Final diagnoses:  None   Discharge Instructions   None    ED Prescriptions   None    PDMP not reviewed this encounter.     [1]  Social History Tobacco Use   Smoking status: Some Days    Types: E-cigarettes   Tobacco comments:    2 packs a week  Vaping Use   Vaping status: Every Day   Substances: Mixture of cannabinoids  Substance Use Topics   Alcohol use: Yes    Comment: not his drug of choice   Drug use: Not Currently    Types: Marijuana    Comment: last time today (at 0800)     Gilmer Etta PARAS, NP 05/27/24 1919

## 2024-05-27 NOTE — ED Triage Notes (Addendum)
 Pt reports possible staph infection to right thigh since last month. Pt reports was seen telehealth and prescribed abx. Pt reports finished initial px but reports it didn't get better. Pt reports was px a different abx but reports had a reaction to this one and stopped it. Last dose several weeks ago.   Pt also reports left foot maceration between fourth and fifth digit and reports is out of cream/ointment. Latest flare-up started last several weeks ago as well.

## 2024-06-18 ENCOUNTER — Encounter: Payer: Self-pay | Admitting: Family Medicine

## 2024-06-21 ENCOUNTER — Other Ambulatory Visit (HOSPITAL_COMMUNITY): Payer: Self-pay

## 2024-06-21 ENCOUNTER — Other Ambulatory Visit: Payer: Self-pay | Admitting: Family Medicine

## 2024-06-21 DIAGNOSIS — G629 Polyneuropathy, unspecified: Secondary | ICD-10-CM

## 2024-06-21 MED ORDER — GABAPENTIN 600 MG PO TABS
600.0000 mg | ORAL_TABLET | Freq: Three times a day (TID) | ORAL | 0 refills | Status: DC
  Filled 2024-06-21: qty 90, 30d supply, fill #0

## 2024-06-21 MED ORDER — GABAPENTIN 600 MG PO TABS: 600.0000 mg | ORAL_TABLET | Freq: Three times a day (TID) | ORAL | 0 refills | Status: AC

## 2024-06-21 NOTE — Telephone Encounter (Signed)
 Rx sent
